# Patient Record
Sex: Female | Born: 1990
Health system: Southern US, Community
[De-identification: ages and names within clinical notes are randomized; demographics above are authoritative.]

## PROBLEM LIST (undated history)

## (undated) DIAGNOSIS — E039 Hypothyroidism, unspecified: Secondary | ICD-10-CM

## (undated) HISTORY — PX: NO PAST SURGERIES: SHX2092

## (undated) HISTORY — DX: Hypothyroidism, unspecified: E03.9

---
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
## 2009-04-30 ENCOUNTER — Ambulatory Visit (HOSPITAL_COMMUNITY): Admission: RE | Admit: 2009-04-30 | Discharge: 2009-04-30 | Payer: Self-pay | Admitting: Advanced Practice Midwife

## 2009-08-24 IMAGING — CT ABDOMEN^ABDOMEN_PELVIS_ROUTINE (ADULT)
1 of 3 series · 14 of 32 positions shown, 19 images · IV contrast (Omnipaque 300)
Comparison: 04/16/2004

CT ABDOMEN

CLINICAL DATA: Epigastric abdominal pain

CT ABDOMEN AND PELVIS WITH CONTRAST
TECHNIQUE: Multidetector CT imaging of the abdomen and pelvis was
performed using the standard protocol following bolus
administration of intravenous contrast.
Contrast: 100 ml 9mnipaque-3EE

[Series 2: abd_pel 5.0 b40f · axial · 0.69mm/px · z∈[-484,-34]mm · 14 of 103 slices shown, 19 images]
[im 7/103  soft-tissue]
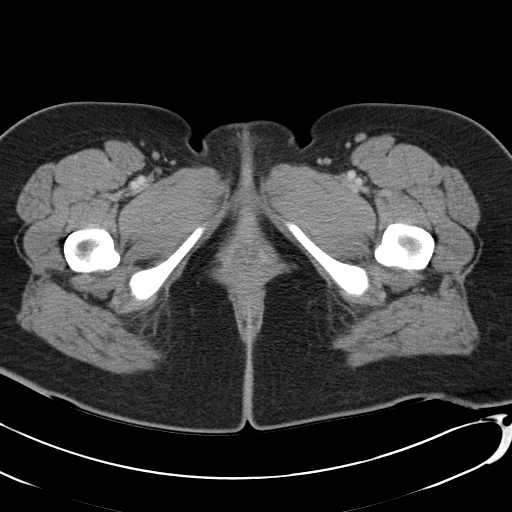
[im 7/103  bone]
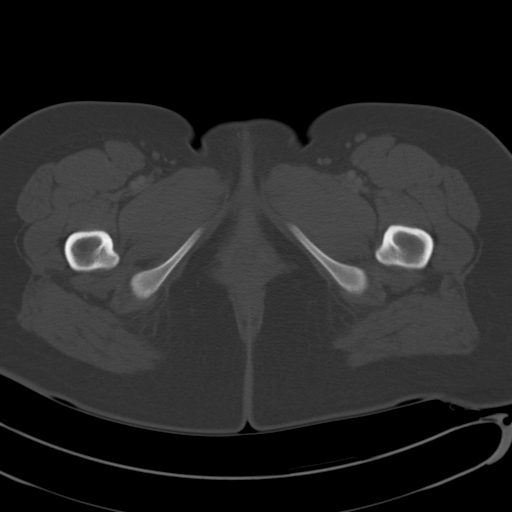
[im 13/103  soft-tissue]
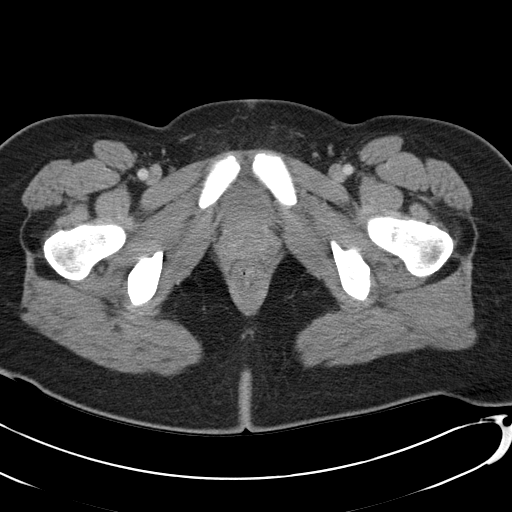
[im 25/103  soft-tissue]
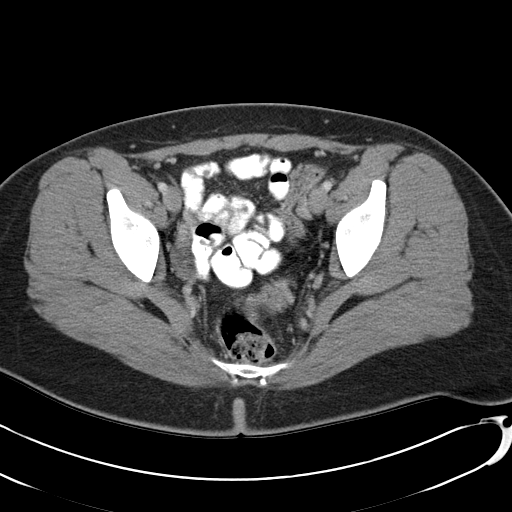
[im 31/103  soft-tissue]
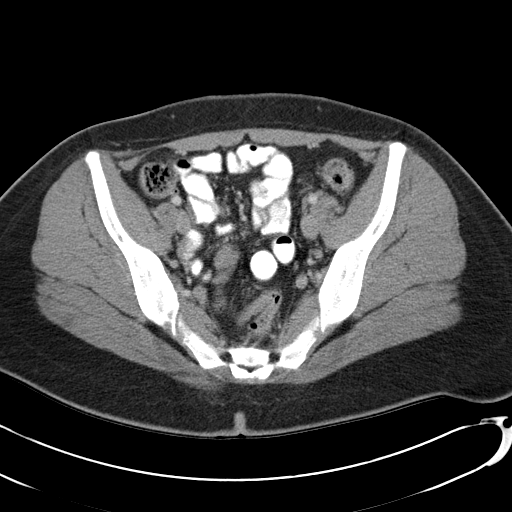
[im 37/103  soft-tissue]
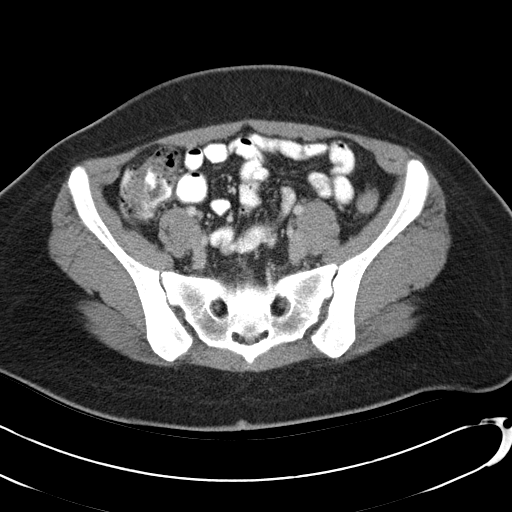
[im 43/103  soft-tissue]
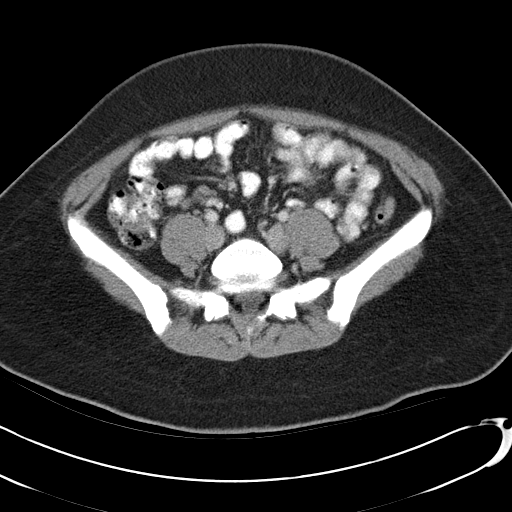
[im 55/103  soft-tissue]
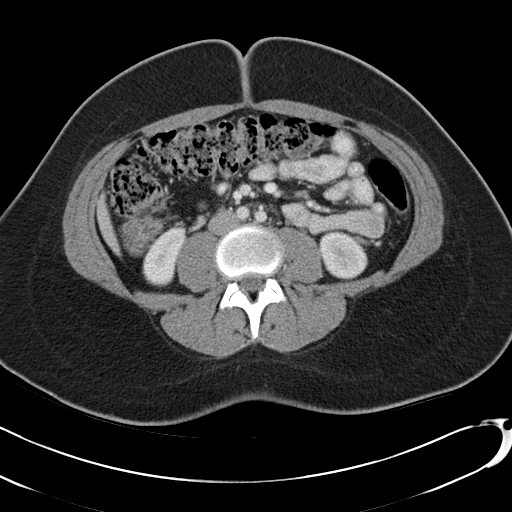
[im 61/103  soft-tissue]
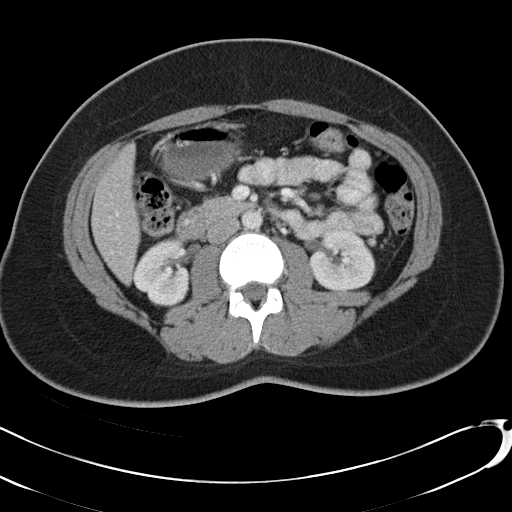
[im 67/103  soft-tissue]
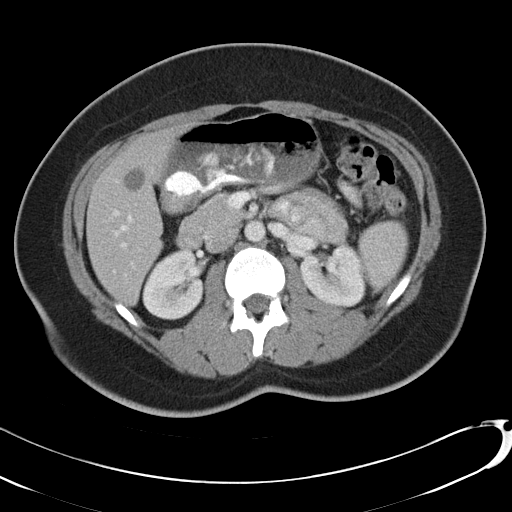
[im 67/103  bone]
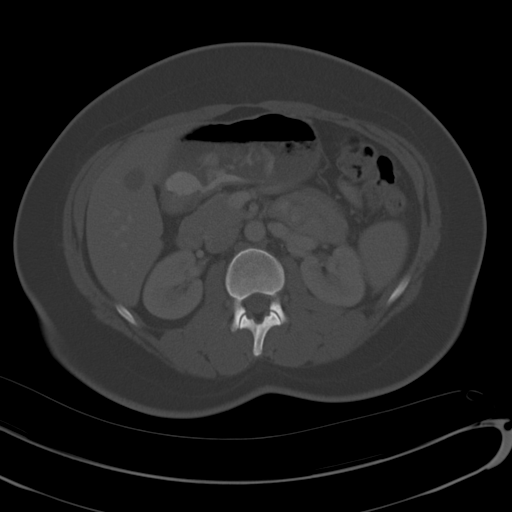
[im 73/103  soft-tissue]
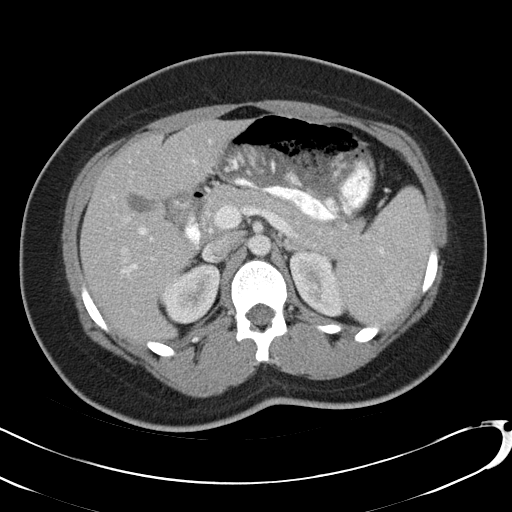
[im 79/103  soft-tissue]
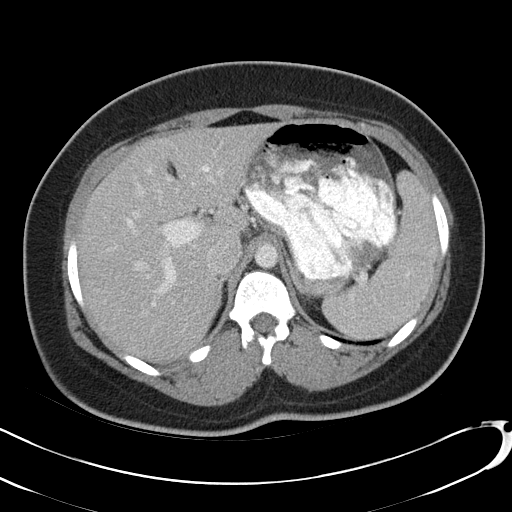
[im 79/103  lung]
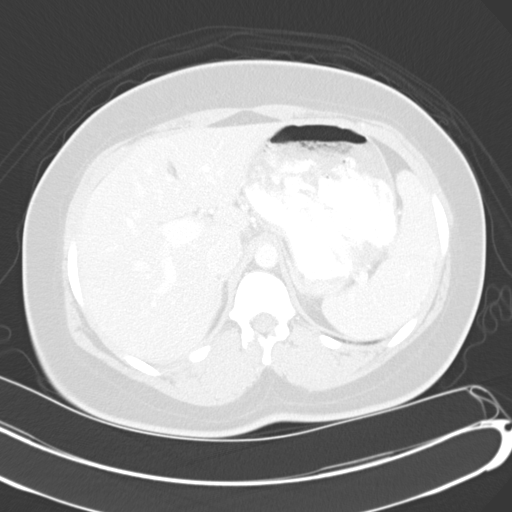
[im 85/103  lung]
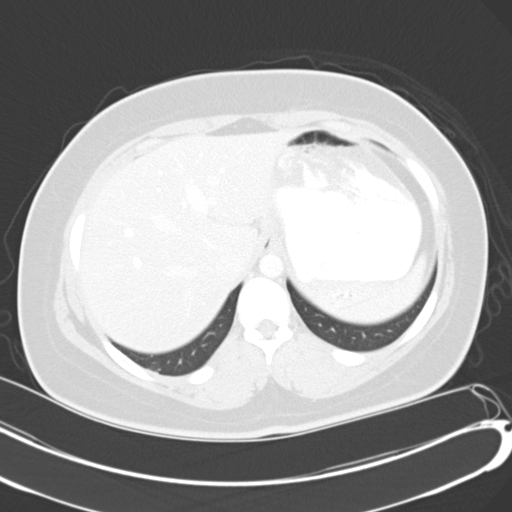
[im 91/103  soft-tissue]
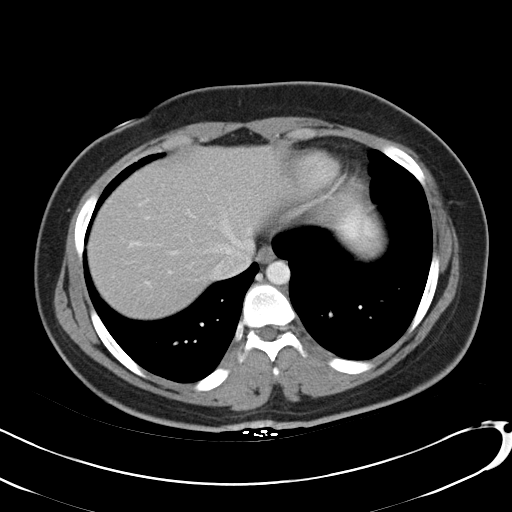
[im 91/103  lung]
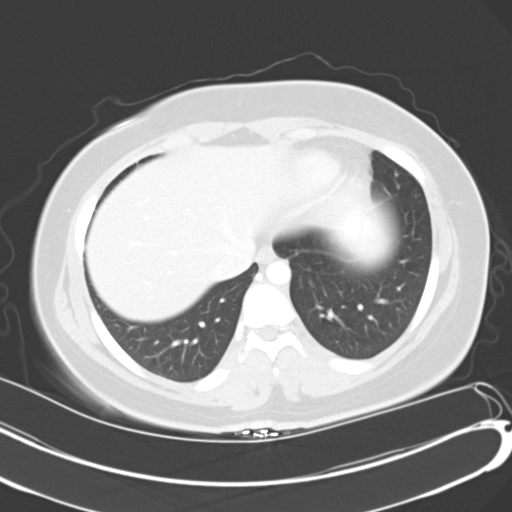
[im 97/103  soft-tissue]
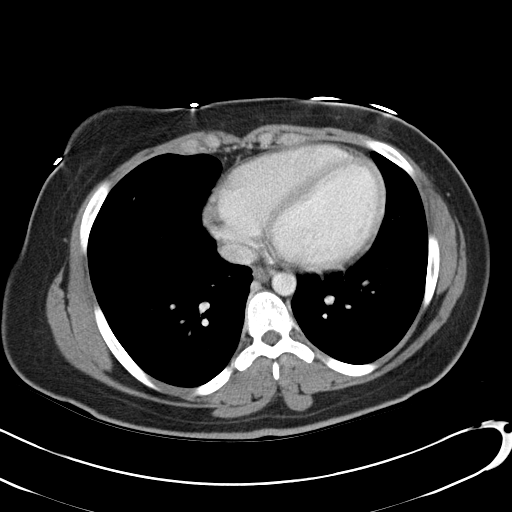
[im 97/103  lung]
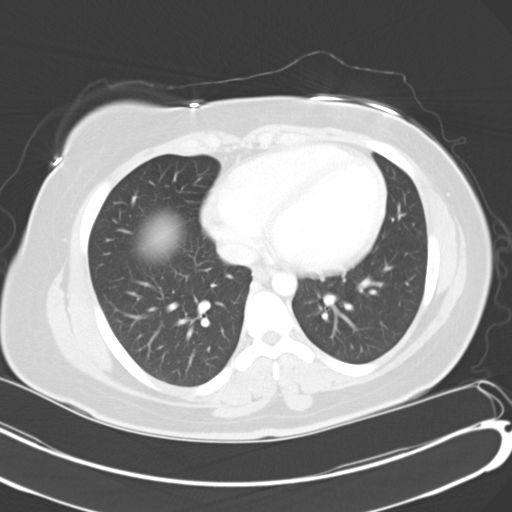

[14 of 32 positions shown; findings below may reference images not displayed]

FINDINGS: The lung bases are clear.  The liver, spleen, pancreas,
adrenal glands and kidneys are unremarkable.  The stomach,
duodenum, small bowel and colon demonstrate no significant
findings.  The aorta is normal in caliber.  The major branch
vessels are normal.

No mesenteric or retroperitoneal masses or adenopathy.  The bony
structures are unremarkable.
IMPRESSION: No acute abdominal findings, mass lesions or adenopathy.

CT PELVIS
FINDINGS: The rectum, sigmoid colon visualized small bowel loops
are unremarkable.  The uterus and ovaries appear normal.  The
appendix is visualized and is normal.  The bladder is unremarkable.
No pelvic masses, adenopathy or free pelvic fluid collections. The
bony pelvis is intact.
IMPRESSION: No acute pelvic findings, mass lesions or adenopathy.

## 2010-07-04 ENCOUNTER — Encounter: Payer: Self-pay | Admitting: Family Medicine

## 2010-08-08 ENCOUNTER — Emergency Department (HOSPITAL_COMMUNITY)
Admission: EM | Admit: 2010-08-08 | Discharge: 2010-08-08 | Disposition: A | Discharge status: Home / Self Care | Payer: 59 | Attending: Emergency Medicine | Admitting: Emergency Medicine

## 2010-08-08 DIAGNOSIS — H9209 Otalgia, unspecified ear: Secondary | ICD-10-CM | POA: Insufficient documentation

## 2010-10-26 NOTE — Op Note (Signed)
Gloria Lyons                 ACCOUNT NO.:  [id number]   MEDICAL RECORD NO.:  [id number]          PATIENT TYPE:  AMB   LOCATION:  DAY                           FACILITY:  [facility]   PHYSICIAN:  Gloria Lyons, M.D.      DATE OF BIRTH:  February 12, 1991   DATE OF PROCEDURE:  06/17/2008  DATE OF DISCHARGE:                               OPERATIVE REPORT   REFERRING Gloria Lyons:  Gloria Duel, MD   PROCEDURE:  Esophagogastroduodenoscopy with cold forceps biopsy of the  gastric and duodenal mucosa.   INDICATIONS FOR EXAM:  Ms. Gloria Lyons is an 20 year old female who complains  of postprandial epigastric pain.  She has had a CT scan and abdominal  ultrasound, which were normal.  She has a significant family history of  irritable bowel syndrome.  Her mother also had polyps.  She is currently  on Aciphex and Hyomax.  The above seemed to give her some relief, but  not complete relief especially with spicy foods.   FINDINGS:  1. Normal esophagus without evidence of Barrett mass, erosion,      ulceration, or stricture.  2. Multiple erosions seen in the antrum.  Biopsies obtained via cold      forceps to evaluate for H. pylori gastritis or eosinophilic      gastritis.  3. Normal duodenal bulb, ampulla, and second portion of the duodenum.      Biopsies obtained via cold forceps to evaluate for eosinophilic      duodenitis or celiac sprue.   DIAGNOSIS:  Mild gastritis, possibly etiology for postprandial  epigastric pain.   RECOMMENDATIONS:  1. She should continue the Aciphex indefinitely.  2. Will call her with the results of the biopsies.  3. Follow up appointment 6 weeks with Dr. Cira Lyons.  4. She is given a handout on gastritis and should follow a high-fiber      diet.  She is also given a handout on high-fiber diet.  5. No aspirins or anti-inflammatory drugs for 30 days.  No      anticoagulation for 5 days.   MEDICATIONS:  1. Demerol 50 mg IV.  2. Versed 4 mg IV.   PROCEDURE TECHNIQUE:  Physical  exam was performed.  Informed consent was  obtained from the patient after explaining the benefits, risks, and  alternatives to the procedure.  The patient was connected to the monitor  and placed in left lateral position.  Continuous oxygen was provided by  nasal cannula and IV medicine administered through an indwelling  cannula.  After administration of sedation, the patient's esophagus was  intubated and scope was advanced under direct visualization to the  second portion of the duodenum.  Scope was removed slowly by  carefully examining the color, texture, anatomy, and integrity of the  mucosa on the way out.  The patient was recovered in endoscopy and  discharged to home in satisfactory condition.   PATH:  Mild gastritis. Benign colonic mucosa.      Gloria Lyons, M.D.  Electronically Signed     SM/MEDQ  D:  06/17/2008  T:  06/17/2008  Job:  [id number]   cc:   Gloria Lyons, M.D.  Fax: [phone]

## 2010-10-26 NOTE — Consult Note (Signed)
NAMEARAYLA, KRUSCHKE                 ACCOUNT NO.:  [id number]   MEDICAL RECORD NO.:  [id number]          PATIENT TYPE:  AMB   LOCATION:  DAY                           FACILITY:  [facility]   PHYSICIAN:  Kassie Mends, M.D.      DATE OF BIRTH:  [birth year]   DATE OF CONSULTATION:  06/03/2008  DATE OF DISCHARGE:                                 CONSULTATION   REASON FOR CONSULTATION:  Abdominal pain, EGD.   PHYSICIAN REQUESTING CONSULTATION:  Patrica Duel, MD   PHYSICIAN CO-SIGNING NOTE:  Kassie Mends, MD   HISTORY OF PRESENT ILLNESS:  Valbona is very pleasant 20 year old  Caucasian female who presents today for further evaluation of ongoing  abdominal pain.  She states she has had abdominal pain off and on for  the past 4 years.  More recently, it has been very regular occurring  daily.  She notes that it seems to be worse with meals.  She  particularly has more abdominal pain with spicy foods.  She denies any  typical heartburn symptoms.  Her mother is with her today and she states  she always had digestive issues when she was a baby.  She states she was  very gassy, colicky as an infant, and she was put on Mylicon.  She  seemed to do well, up until she was about 13, however.  She states she  still is very gassy.  The patient states she has fluctuance.  Bowel  movements occur about every 2 days.  Her stools are soft and she does  not have to strain.  This is her normal.  She denies any blood in the  stool or melena.  She denies any nausea, vomiting, dysphagia, or  odynophagia.  She has completed 1 semester of classes at [employer].  Her  mother thinks her daughter is stressed out.  She is transferring to [employer]  because she did not like to have to commute to [place] everyday.  She  is currently on Aciphex 20 mg daily.  She still has abdominal pain.  She  states that she takes the HyoMax, it does seem to help.   CURRENT MEDICATIONS:  1. Aciphex 20 mg daily.  2. HyoMax p.r.n.   ALLERGIES:  No  known drug allergies.   PAST MEDICAL HISTORY:  Abdominal pain recurrent.  In 2005, she had a  workup including HIDA scan with a gallbladder ejection fraction normal  at 39.6%.  She had a CT at that time as well, which showed a large  amount of stool in the colon.  Abdominal ultrasound was normal.  In  2008, she had another abdominal ultrasound, which was normal and upper  GI series, which was normal.  She had a CT of the abdomen and pelvis on  May 13, 2008, which was normal.  She had blood work recently we are  trying to retrieve these results.   FAMILY HISTORY:  Mother has history of IBS and colonic polyps.  She is  not sure what type of polyps, she has a followup with her  gastroenterologist Dr. Virginia Rochester next  month.  Her father has had leukemia.   SOCIAL HISTORY:  She is single.  She is a Landscape architect.  She is nonsmoker.  No alcohol use.  No drug use either.   REVIEW OF SYSTEMS:  See HPI for GI.  CONSTITUTIONAL:  She complains of  weight gain.  CARDIOPULMONARY:  No chest pain, shortness of breath,  palpitations, or cough.  GENITOURINARY:  No dysuria or hematuria.  Her  menstrual cycles occur about every 5-6 week.  She denies any cramping  and they are fairly light.   PHYSICAL EXAMINATION:  VITAL SIGNS:  Weight 198.5, height 5 feet 5  inches, temperature 98.2, blood pressure 120/70, and pulse 80.  GENERAL:  Pleasant, well-nourished, well-developed Caucasian female, in  no acute distress.  SKIN:  Warm and dry.  No jaundice.  HEENT:  Sclerae nonicteric.  Oropharyngeal mucosa moist and pink.  No  lesions, erythema, or exudate.  No lymphadenopathy or thyromegaly.  CHEST:  Lungs are clear to auscultation.  CARDIAC:  Regular rate and rhythm.  Normal S1 and S2.  No murmurs, rubs,  or gallops.  ABDOMEN:  Positive bowel sounds. Abdomen is slightly obese and soft.  She has mild tenderness above the umbilicus and to deep palpation.  No  rebound or guarding.  No  organomegaly or masses.  No abdominal bruits or  hernias.  LOWER EXTREMITIES:  No edema.   LABORATORY DATA:  On April 30, 2008, CBC is normal.  MET-7, LFTs  normal.  H. pylori serologies were negative.   IMPRESSION:  Camry is 20 year old female who has recurrent abdominal  pain.  This has all been off and on now for 4 years.  She has had  extensive evaluation as above.  She has daily epigastric pain at this  point worse with spicy foods.  She is on Aciphex 20 mg daily and was  recently started.  Her differential diagnosis includes dyspepsia,  gastroesophageal reflux disease, peptic ulcer disease (less likely).  I  have not excluded biliary dyskinesia at this point.  She really does not  appreciate any constipation, but may consider empirical treatment at  some point if no positive findings.   PLAN:  1. EGD with Dr. Cira Servant in the near future.  2. She will continue Aciphex and HyoMax for now.  3. Antireflux measures and consume a bland diet for now.      Tana Coast, P.A.      Kassie Mends, M.D.  Electronically Signed    LL/MEDQ  D:  06/03/2008  T:  06/04/2008  Job:  [id number]   cc:   Patrica Duel, M.D.  Fax: [phone]

## 2010-10-26 NOTE — Assessment & Plan Note (Signed)
NAMEMarland Kitchen  Gloria Lyons, Gloria Lyons                  CHART#:  [id number]   DATE:  07/30/2008                       DOB:  04/19/1991   PROBLEM LIST:  1. Mild gastritis on upper endoscopy.  2. Mild irritable bowel syndrome, constipation predominant.   SUBJECTIVE:  The patient is an 20 year old female who presents as a  return patient visit.  She has had used no HyoMax for 3-4 weeks.  She  takes Aciphex every day, but sometimes forgets.  She rarely has  abdominal pain.  Her appetite is good.   MEDICATIONS:  1. Aciphex 20 mg daily.  2. HyoMax as needed.   OBJECTIVE:  VITAL SIGNS:  Weight 197 pounds (unchanged since December  2009), height 5 feet 5 inches, temperature 98.4, blood pressure 110/72,  pulse 80.GENERAL:  She is in no apparent distress.  Alert and oriented  x4.LUNGS:  Clear to auscultation bilaterally.CARDIOVASCULAR:  Regular  rhythm.  No murmur.  Normal S1, S2.ABDOMEN:  Bowel sounds present, soft,  nontender, nondistended.  No rebound or guarding.   ASSESSMENT:  The patient is an 20 year old female who had gastritis and  her symptoms are now controlled on Aciphex.  She may have mild irritable  bowel, constipation predominant.  She has not used the HyoMax anymore.  Thank you for allowing me to see the patient in consultation.  My  recommendations follow.   RECOMMENDATIONS:  1. She should continue the Aciphex indefinitely.  2. She may use the HyoMax as needed for abdominal pain.  She may      follow with me as needed. Discussed Hyomax may cause constipation.       Kassie Mends, M.D.  Electronically Signed    SM/MEDQ  D:  07/30/2008  T:  07/31/2008  Job:  [id number]   cc:   Patrica Duel, M.D.

## 2017-01-25 ENCOUNTER — Ambulatory Visit (INDEPENDENT_AMBULATORY_CARE_PROVIDER_SITE_OTHER): Payer: Self-pay | Admitting: Adult Health

## 2017-01-25 ENCOUNTER — Encounter: Payer: Self-pay | Admitting: Adult Health

## 2017-01-25 VITALS — BP 120/80 | HR 87 | Ht 64.75 in | Wt 215.0 lb

## 2017-01-25 DIAGNOSIS — Z3A08 8 weeks gestation of pregnancy: Secondary | ICD-10-CM

## 2017-01-25 DIAGNOSIS — Z3201 Encounter for pregnancy test, result positive: Secondary | ICD-10-CM

## 2017-01-25 DIAGNOSIS — O3680X0 Pregnancy with inconclusive fetal viability, not applicable or unspecified: Secondary | ICD-10-CM

## 2017-01-25 DIAGNOSIS — N926 Irregular menstruation, unspecified: Secondary | ICD-10-CM

## 2017-01-25 LAB — POCT URINE PREGNANCY: Preg Test, Ur: POSITIVE — AB

## 2017-01-25 MED ORDER — PRENATAL GUMMIES/DHA & FA 0.4-32.5 MG PO CHEW: 2.0000 | CHEWABLE_TABLET | Freq: Every day | ORAL | Status: DC

## 2017-01-25 NOTE — Patient Instructions (Signed)
First Trimester of Pregnancy The first trimester of pregnancy is from week 1 until the end of week 13 (months 1 through 3). A week after a sperm fertilizes an egg, the egg will implant on the wall of the uterus. This embryo will begin to develop into a baby. Genes from you and your partner will form the baby. The female genes will determine whether the baby will be a boy or a girl. At 6-8 weeks, the eyes and face will be formed, and the heartbeat can be seen on ultrasound. At the end of 12 weeks, all the baby's organs will be formed. Now that you are pregnant, you will want to do everything you can to have a healthy baby. Two of the most important things are to get good prenatal care and to follow your health care provider's instructions. Prenatal care is all the medical care you receive before the baby's birth. This care will help prevent, find, and treat any problems during the pregnancy and childbirth. Body changes during your first trimester Your body goes through many changes during pregnancy. The changes vary from woman to woman.  You may gain or lose a couple of pounds at first.  You may feel sick to your stomach (nauseous) and you may throw up (vomit). If the vomiting is uncontrollable, call your health care provider.  You may tire easily.  You may develop headaches that can be relieved by medicines. All medicines should be approved by your health care provider.  You may urinate more often. Painful urination may mean you have a bladder infection.  You may develop heartburn as a result of your pregnancy.  You may develop constipation because certain hormones are causing the muscles that push stool through your intestines to slow down.  You may develop hemorrhoids or swollen veins (varicose veins).  Your breasts may begin to grow larger and become tender. Your nipples may stick out more, and the tissue that surrounds them (areola) may become darker.  Your gums may bleed and may be  sensitive to brushing and flossing.  Dark spots or blotches (chloasma, mask of pregnancy) may develop on your face. This will likely fade after the baby is born.  Your menstrual periods will stop.  You may have a loss of appetite.  You may develop cravings for certain kinds of food.  You may have changes in your emotions from day to day, such as being excited to be pregnant or being concerned that something may go wrong with the pregnancy and baby.  You may have more vivid and strange dreams.  You may have changes in your hair. These can include thickening of your hair, rapid growth, and changes in texture. Some women also have hair loss during or after pregnancy, or hair that feels dry or thin. Your hair will most likely return to normal after your baby is born.  What to expect at prenatal visits During a routine prenatal visit:  You will be weighed to make sure you and the baby are growing normally.  Your blood pressure will be taken.  Your abdomen will be measured to track your baby's growth.  The fetal heartbeat will be listened to between weeks 10 and 14 of your pregnancy.  Test results from any previous visits will be discussed.  Your health care provider may ask you:  How you are feeling.  If you are feeling the baby move.  If you have had any abnormal symptoms, such as leaking fluid, bleeding, severe headaches,   or abdominal cramping.  If you are using any tobacco products, including cigarettes, chewing tobacco, and electronic cigarettes.  If you have any questions.  Other tests that may be performed during your first trimester include:  Blood tests to find your blood type and to check for the presence of any previous infections. The tests will also be used to check for low iron levels (anemia) and protein on red blood cells (Rh antibodies). Depending on your risk factors, or if you previously had diabetes during pregnancy, you may have tests to check for high blood  sugar that affects pregnant women (gestational diabetes).  Urine tests to check for infections, diabetes, or protein in the urine.  An ultrasound to confirm the proper growth and development of the baby.  Fetal screens for spinal cord problems (spina bifida) and Down syndrome.  HIV (human immunodeficiency virus) testing. Routine prenatal testing includes screening for HIV, unless you choose not to have this test.  You may need other tests to make sure you and the baby are doing well.  Follow these instructions at home: Medicines  Follow your health care provider's instructions regarding medicine use. Specific medicines may be either safe or unsafe to take during pregnancy.  Take a prenatal vitamin that contains at least 600 micrograms (mcg) of folic acid.  If you develop constipation, try taking a stool softener if your health care provider approves. Eating and drinking  Eat a balanced diet that includes fresh fruits and vegetables, whole grains, good sources of protein such as meat, eggs, or tofu, and low-fat dairy. Your health care provider will help you determine the amount of weight gain that is right for you.  Avoid raw meat and uncooked cheese. These carry germs that can cause birth defects in the baby.  Eating four or five small meals rather than three large meals a day may help relieve nausea and vomiting. If you start to feel nauseous, eating a few soda crackers can be helpful. Drinking liquids between meals, instead of during meals, also seems to help ease nausea and vomiting.  Limit foods that are high in fat and processed sugars, such as fried and sweet foods.  To prevent constipation: ? Eat foods that are high in fiber, such as fresh fruits and vegetables, whole grains, and beans. ? Drink enough fluid to keep your urine clear or pale yellow. Activity  Exercise only as directed by your health care provider. Most women can continue their usual exercise routine during  pregnancy. Try to exercise for 30 minutes at least 5 days a week. Exercising will help you: ? Control your weight. ? Stay in shape. ? Be prepared for labor and delivery.  Experiencing pain or cramping in the lower abdomen or lower back is a good sign that you should stop exercising. Check with your health care provider before continuing with normal exercises.  Try to avoid standing for long periods of time. Move your legs often if you must stand in one place for a long time.  Avoid heavy lifting.  Wear low-heeled shoes and practice good posture.  You may continue to have sex unless your health care provider tells you not to. Relieving pain and discomfort  Wear a good support bra to relieve breast tenderness.  Take warm sitz baths to soothe any pain or discomfort caused by hemorrhoids. Use hemorrhoid cream if your health care provider approves.  Rest with your legs elevated if you have leg cramps or low back pain.  If you develop   varicose veins in your legs, wear support hose. Elevate your feet for 15 minutes, 3-4 times a day. Limit salt in your diet. Prenatal care  Schedule your prenatal visits by the twelfth week of pregnancy. They are usually scheduled monthly at first, then more often in the last 2 months before delivery.  Write down your questions. Take them to your prenatal visits.  Keep all your prenatal visits as told by your health care provider. This is important. Safety  Wear your seat belt at all times when driving.  Make a list of emergency phone numbers, including numbers for family, friends, the hospital, and police and fire departments. General instructions  Ask your health care provider for a referral to a local prenatal education class. Begin classes no later than the beginning of month 6 of your pregnancy.  Ask for help if you have counseling or nutritional needs during pregnancy. Your health care provider can offer advice or refer you to specialists for help  with various needs.  Do not use hot tubs, steam rooms, or saunas.  Do not douche or use tampons or scented sanitary pads.  Do not cross your legs for long periods of time.  Avoid cat litter boxes and soil used by cats. These carry germs that can cause birth defects in the baby and possibly loss of the fetus by miscarriage or stillbirth.  Avoid all smoking, herbs, alcohol, and medicines not prescribed by your health care provider. Chemicals in these products affect the formation and growth of the baby.  Do not use any products that contain nicotine or tobacco, such as cigarettes and e-cigarettes. If you need help quitting, ask your health care provider. You may receive counseling support and other resources to help you quit.  Schedule a dentist appointment. At home, brush your teeth with a soft toothbrush and be gentle when you floss. Contact a health care provider if:  You have dizziness.  You have mild pelvic cramps, pelvic pressure, or nagging pain in the abdominal area.  You have persistent nausea, vomiting, or diarrhea.  You have a bad smelling vaginal discharge.  You have pain when you urinate.  You notice increased swelling in your face, hands, legs, or ankles.  You are exposed to fifth disease or chickenpox.  You are exposed to German measles (rubella) and have never had it. Get help right away if:  You have a fever.  You are leaking fluid from your vagina.  You have spotting or bleeding from your vagina.  You have severe abdominal cramping or pain.  You have rapid weight gain or loss.  You vomit blood or material that looks like coffee grounds.  You develop a severe headache.  You have shortness of breath.  You have any kind of trauma, such as from a fall or a car accident. Summary  The first trimester of pregnancy is from week 1 until the end of week 13 (months 1 through 3).  Your body goes through many changes during pregnancy. The changes vary from  woman to woman.  You will have routine prenatal visits. During those visits, your health care provider will examine you, discuss any test results you may have, and talk with you about how you are feeling. This information is not intended to replace advice given to you by your health care provider. Make sure you discuss any questions you have with your health care provider. Document Released: 05/24/2001 Document Revised: 05/11/2016 Document Reviewed: 05/11/2016 Elsevier Interactive Patient Education  2017 Elsevier   Inc.a Advised to not do cruise to [place] in 1 week

## 2017-01-25 NOTE — Progress Notes (Signed)
Subjective:     Patient ID: Gloria Lyons, female   DOB: 08/08/1990, 26 y.o.   MRN: [id number]  HPI Gloria Lyons is a 26 year old white female, married in for UPT, has missed a period and is gassy.She works at [employer], so does her husband.   Review of Systems +missed period  + gassy   Reviewed past medical,surgical, social and family history. Reviewed medications and allergies.     Objective:   Physical Exam BP 120/80 (BP Location: Left Arm, Patient Position: Sitting, Cuff Size: Large)   Pulse 87   Ht 5' 4.75" (1.645 m)   Wt 215 lb (97.5 kg)   LMP 11/28/2016 (Approximate)   BMI 36.05 kg/m UPT +, about 8+2 weeks by LMP, with EDD 09/04/17.Skin warm and dry. Neck: mid line trachea, normal thyroid, good ROM, no lymphadenopathy noted. Lungs: clear to ausculation bilaterally. Cardiovascular: regular rate and rhythm.Abdomen is soft and non tender. PHQ 2 score 0.PT [facility] and have after hours call service.Her husband says they have cruise to [place] for [place]8, instructed not to go due [facility] and its risks.     Assessment:     1. Pregnancy examination or test, positive result   2. [redacted] weeks gestation of pregnancy   3. Encounter to determine fetal viability of pregnancy, single or unspecified fetus       Plan:     Take Gummies  Return in 1 week for dating [place] Review handouts on first trimester and by [employer] [facility] Advised to not take cruise to [place], [place] virus     Note given no lifting >25 lbs

## 2017-02-01 ENCOUNTER — Other Ambulatory Visit: Payer: Self-pay | Admitting: Adult Health

## 2017-02-01 ENCOUNTER — Ambulatory Visit (INDEPENDENT_AMBULATORY_CARE_PROVIDER_SITE_OTHER): Payer: Federal, State, Local not specified - PPO

## 2017-02-01 ENCOUNTER — Telehealth: Payer: Self-pay | Admitting: Adult Health

## 2017-02-01 DIAGNOSIS — Z3A09 9 weeks gestation of pregnancy: Secondary | ICD-10-CM | POA: Diagnosis not present

## 2017-02-01 DIAGNOSIS — O3680X0 Pregnancy with inconclusive fetal viability, not applicable or unspecified: Secondary | ICD-10-CM

## 2017-02-01 MED ORDER — DOXYLAMINE-PYRIDOXINE ER 20-20 MG PO TBCR: 1.0000 | EXTENDED_RELEASE_TABLET | Freq: Two times a day (BID) | ORAL | 0 refills | Status: DC

## 2017-02-01 NOTE — Progress Notes (Signed)
[place] TA/TV 9+2 wks single IUP w/ys,positive FHT 174 bpm,normal ovaries bilat,crl 27.08 mm,EDD 09/04/2017  By LMP

## 2017-02-01 NOTE — Telephone Encounter (Signed)
Pt had [place] today, and is having more nausea, gave her 36 bonjesta to try, 1 bid, also told her again not to go on cruise to [place] but it is her choice.She said that there is no Zeka in that area now. I still would not go and risk it.

## 2017-02-21 ENCOUNTER — Ambulatory Visit: Payer: Federal, State, Local not specified - PPO | Admitting: *Deleted

## 2017-02-21 ENCOUNTER — Ambulatory Visit (INDEPENDENT_AMBULATORY_CARE_PROVIDER_SITE_OTHER): Payer: Federal, State, Local not specified - PPO | Admitting: Advanced Practice Midwife

## 2017-02-21 ENCOUNTER — Other Ambulatory Visit (HOSPITAL_COMMUNITY)
Admission: RE | Admit: 2017-02-21 | Discharge: 2017-02-21 | Disposition: A | Discharge status: Home / Self Care | Payer: Federal, State, Local not specified - PPO | Source: Ambulatory Visit | Attending: Advanced Practice Midwife | Admitting: Advanced Practice Midwife

## 2017-02-21 ENCOUNTER — Encounter: Payer: Self-pay | Admitting: Advanced Practice Midwife

## 2017-02-21 VITALS — BP 112/74 | HR 87 | Wt 219.0 lb

## 2017-02-21 DIAGNOSIS — Z3401 Encounter for supervision of normal first pregnancy, first trimester: Secondary | ICD-10-CM | POA: Insufficient documentation

## 2017-02-21 DIAGNOSIS — Z124 Encounter for screening for malignant neoplasm of cervix: Secondary | ICD-10-CM

## 2017-02-21 DIAGNOSIS — Z3400 Encounter for supervision of normal first pregnancy, unspecified trimester: Secondary | ICD-10-CM | POA: Insufficient documentation

## 2017-02-21 DIAGNOSIS — E039 Hypothyroidism, unspecified: Secondary | ICD-10-CM | POA: Diagnosis not present

## 2017-02-21 DIAGNOSIS — Z3A12 12 weeks gestation of pregnancy: Secondary | ICD-10-CM

## 2017-02-21 DIAGNOSIS — O99281 Endocrine, nutritional and metabolic diseases complicating pregnancy, first trimester: Secondary | ICD-10-CM

## 2017-02-21 DIAGNOSIS — Z1389 Encounter for screening for other disorder: Secondary | ICD-10-CM

## 2017-02-21 DIAGNOSIS — Z331 Pregnant state, incidental: Secondary | ICD-10-CM

## 2017-02-21 LAB — POCT URINALYSIS DIPSTICK
KETONES UA: NEGATIVE
Leukocytes, UA: NEGATIVE
Nitrite, UA: NEGATIVE
PROTEIN UA: NEGATIVE
RBC UA: NEGATIVE

## 2017-02-21 NOTE — Progress Notes (Signed)
  Subjective:    Dale Durhamshley W Lobban is a G1P0 3953w1d being seen today for her first obstetrical visit.  Her obstetrical history is significant for first pregnancy.  Pregnancy history fully reviewed.  Patient reports no complaints.  Feels well. .  Vitals:   02/21/17 1422  BP: 112/74  Pulse: 87  Weight: 219 lb (99.3 kg)    HISTORY: OB History  Gravida Para Term Preterm AB Living  1            SAB TAB Ectopic Multiple Live Births               # Outcome Date GA Lbr Len/2nd Weight Sex Delivery Anes PTL Lv  1 Current              Past Medical History:  Diagnosis Date  . Hypothyroidism    History reviewed. No pertinent surgical history. Family History  Problem Relation Age of Onset  . Cancer Paternal Grandfather   . Diabetes Paternal Grandfather   . Cancer Maternal Grandmother   . Diabetes Maternal Grandmother   . Diabetes Maternal Grandfather   . Leukemia Father   . Hypertension Father   . Heart attack Mother   . Seizures Mother   . Hypertension Mother      Exam       Pelvic Exam:    Perineum: Normal Perineum   Vulva: normal   Vagina:  normal mucosa, normal discharge, no palpable nodules   Uterus Normal, Gravid, FH: 12     Cervix: normal   Adnexa: Not palpable   Urinary:  urethral meatus normal    System:     Skin: normal coloration and turgor, no rashes    Neurologic: oriented, normal, normal mood   Extremities: normal strength, tone, and muscle mass   HEENT PERRLA   Mouth/Teeth mucous membranes moist, normal dentition   Neck supple and no masses   Cardiovascular: regular rate and rhythm   Respiratory:  appears well, vitals normal, no respiratory distress, acyanotic   Abdomen: soft, non-tender;  FHR: 160 US          Assessment:    Pregnancy: G1P0 Patient Active Problem List   Diagnosis Date Noted  . Supervision of normal first pregnancy 02/21/2017  . Hypothyroidism         Plan:     Initial labs drawn.TSH added Continue prenatal vitamins   Problem list reviewed and updated  Reviewed n/v relief measures and warning s/s to report  Reviewed recommended weight gain based on pre-gravid BMI  Encouraged well-balanced diet Genetic Screening discussed Integrated Screen: declined.  Ultrasound discussed; fetal survey: requested.  Return in about 4 weeks (around 03/21/2017) for LROB.  CRESENZO-DISHMAN,Dawnn Nam 02/21/2017

## 2017-02-21 NOTE — Patient Instructions (Signed)
 First Trimester of Pregnancy The first trimester of pregnancy is from week 1 until the end of week 12 (months 1 through 3). A week after a sperm fertilizes an egg, the egg will implant on the wall of the uterus. This embryo will begin to develop into a baby. Genes from you and your partner are forming the baby. The female genes determine whether the baby is a boy or a girl. At 6-8 weeks, the eyes and face are formed, and the heartbeat can be seen on ultrasound. At the end of 12 weeks, all the baby's organs are formed.  Now that you are pregnant, you will want to do everything you can to have a healthy baby. Two of the most important things are to get good prenatal care and to follow your health care provider's instructions. Prenatal care is all the medical care you receive before the baby's birth. This care will help prevent, find, and treat any problems during the pregnancy and childbirth. BODY CHANGES Your body goes through many changes during pregnancy. The changes vary from woman to woman.   You may gain or lose a couple of pounds at first.  You may feel sick to your stomach (nauseous) and throw up (vomit). If the vomiting is uncontrollable, call your health care provider.  You may tire easily.  You may develop headaches that can be relieved by medicines approved by your health care provider.  You may urinate more often. Painful urination may mean you have a bladder infection.  You may develop heartburn as a result of your pregnancy.  You may develop constipation because certain hormones are causing the muscles that push waste through your intestines to slow down.  You may develop hemorrhoids or swollen, bulging veins (varicose veins).  Your breasts may begin to grow larger and become tender. Your nipples may stick out more, and the tissue that surrounds them (areola) may become darker.  Your gums may bleed and may be sensitive to brushing and flossing.  Dark spots or blotches  (chloasma, mask of pregnancy) may develop on your face. This will likely fade after the baby is born.  Your menstrual periods will stop.  You may have a loss of appetite.  You may develop cravings for certain kinds of food.  You may have changes in your emotions from day to day, such as being excited to be pregnant or being concerned that something may go wrong with the pregnancy and baby.  You may have more vivid and strange dreams.  You may have changes in your hair. These can include thickening of your hair, rapid growth, and changes in texture. Some women also have hair loss during or after pregnancy, or hair that feels dry or thin. Your hair will most likely return to normal after your baby is born. WHAT TO EXPECT AT YOUR PRENATAL VISITS During a routine prenatal visit:  You will be weighed to make sure you and the baby are growing normally.  Your blood pressure will be taken.  Your abdomen will be measured to track your baby's growth.  The fetal heartbeat will be listened to starting around week 10 or 12 of your pregnancy.  Test results from any previous visits will be discussed. Your health care provider may ask you:  How you are feeling.  If you are feeling the baby move.  If you have had any abnormal symptoms, such as leaking fluid, bleeding, severe headaches, or abdominal cramping.  If you have any questions. Other   tests that may be performed during your first trimester include:  Blood tests to find your blood type and to check for the presence of any previous infections. They will also be used to check for low iron levels (anemia) and Rh antibodies. Later in the pregnancy, blood tests for diabetes will be done along with other tests if problems develop.  Urine tests to check for infections, diabetes, or protein in the urine.  An ultrasound to confirm the proper growth and development of the baby.  An amniocentesis to check for possible genetic problems.  Fetal  screens for spina bifida and Down syndrome.  You may need other tests to make sure you and the baby are doing well. HOME CARE INSTRUCTIONS  Medicines  Follow your health care provider's instructions regarding medicine use. Specific medicines may be either safe or unsafe to take during pregnancy.  Take your prenatal vitamins as directed.  If you develop constipation, try taking a stool softener if your health care provider approves. Diet  Eat regular, well-balanced meals. Choose a variety of foods, such as meat or vegetable-based protein, fish, milk and low-fat dairy products, vegetables, fruits, and whole grain breads and cereals. Your health care provider will help you determine the amount of weight gain that is right for you.  Avoid raw meat and uncooked cheese. These carry germs that can cause birth defects in the baby.  Eating four or five small meals rather than three large meals a day may help relieve nausea and vomiting. If you start to feel nauseous, eating a few soda crackers can be helpful. Drinking liquids between meals instead of during meals also seems to help nausea and vomiting.  If you develop constipation, eat more high-fiber foods, such as fresh vegetables or fruit and whole grains. Drink enough fluids to keep your urine clear or pale yellow. Activity and Exercise  Exercise only as directed by your health care provider. Exercising will help you:  Control your weight.  Stay in shape.  Be prepared for labor and delivery.  Experiencing pain or cramping in the lower abdomen or low back is a good sign that you should stop exercising. Check with your health care provider before continuing normal exercises.  Try to avoid standing for long periods of time. Move your legs often if you must stand in one place for a long time.  Avoid heavy lifting.  Wear low-heeled shoes, and practice good posture.  You may continue to have sex unless your health care provider directs you  otherwise. Relief of Pain or Discomfort  Wear a good support bra for breast tenderness.   Take warm sitz baths to soothe any pain or discomfort caused by hemorrhoids. Use hemorrhoid cream if your health care provider approves.   Rest with your legs elevated if you have leg cramps or low back pain.  If you develop varicose veins in your legs, wear support hose. Elevate your feet for 15 minutes, 3-4 times a day. Limit salt in your diet. Prenatal Care  Schedule your prenatal visits by the twelfth week of pregnancy. They are usually scheduled monthly at first, then more often in the last 2 months before delivery.  Write down your questions. Take them to your prenatal visits.  Keep all your prenatal visits as directed by your health care provider. Safety  Wear your seat belt at all times when driving.  Make a list of emergency phone numbers, including numbers for family, friends, the hospital, and police and fire departments. General   Tips  Ask your health care provider for a referral to a local prenatal education class. Begin classes no later than at the beginning of month 6 of your pregnancy.  Ask for help if you have counseling or nutritional needs during pregnancy. Your health care provider can offer advice or refer you to specialists for help with various needs.  Do not use hot tubs, steam rooms, or saunas.  Do not douche or use tampons or scented sanitary pads.  Do not cross your legs for long periods of time.  Avoid cat litter boxes and soil used by cats. These carry germs that can cause birth defects in the baby and possibly loss of the fetus by miscarriage or stillbirth.  Avoid all smoking, herbs, alcohol, and medicines not prescribed by your health care provider. Chemicals in these affect the formation and growth of the baby.  Schedule a dentist appointment. At home, brush your teeth with a soft toothbrush and be gentle when you floss. SEEK MEDICAL CARE IF:   You have  dizziness.  You have mild pelvic cramps, pelvic pressure, or nagging pain in the abdominal area.  You have persistent nausea, vomiting, or diarrhea.  You have a bad smelling vaginal discharge.  You have pain with urination.  You notice increased swelling in your face, hands, legs, or ankles. SEEK IMMEDIATE MEDICAL CARE IF:   You have a fever.  You are leaking fluid from your vagina.  You have spotting or bleeding from your vagina.  You have severe abdominal cramping or pain.  You have rapid weight gain or loss.  You vomit blood or material that looks like coffee grounds.  You are exposed to German measles and have never had them.  You are exposed to fifth disease or chickenpox.  You develop a severe headache.  You have shortness of breath.  You have any kind of trauma, such as from a fall or a car accident. Document Released: 05/24/2001 Document Revised: 10/14/2013 Document Reviewed: 04/09/2013 [employer] Patient Information 2015 [employer]. This information is not intended to replace advice given to you by your health care provider. Make sure you discuss any questions you have with your health care provider.   Nausea & Vomiting  Have saltine crackers or pretzels by your bed and eat a few bites before you raise your head out of bed in the morning  Eat small frequent meals throughout the day instead of large meals  Drink plenty of fluids throughout the day to stay hydrated, just don't drink a lot of fluids with your meals.  This can make your stomach fill up faster making you feel sick  Do not brush your teeth right after you eat  Products with real ginger are good for nausea, like ginger ale and ginger hard candy Make sure it says made with real ginger!  Sucking on sour candy like lemon heads is also good for nausea  If your prenatal vitamins make you nauseated, take them at night so you will sleep through the nausea  Sea Bands  If you feel like you need  medicine for the nausea & vomiting please let us know  If you are unable to keep any fluids or food down please let us know   Constipation  Drink plenty of fluid, preferably water, throughout the day  Eat foods high in fiber such as fruits, vegetables, and grains  Exercise, such as walking, is a good way to keep your bowels regular  Drink warm fluids, especially warm   prune juice, or decaf coffee  Eat a 1/2 cup of real oatmeal (not instant), 1/2 cup applesauce, and 1/2-1 cup warm prune juice every day  If needed, you may take Colace (docusate sodium) stool softener once or twice a day to help keep the stool soft. If you are pregnant, wait until you are out of your first trimester (12-14 weeks of pregnancy)  If you still are having problems with constipation, you may take Miralax once daily as needed to help keep your bowels regular.  If you are pregnant, wait until you are out of your first trimester (12-14 weeks of pregnancy)  Safe Medications in Pregnancy   Acne: Benzoyl Peroxide Salicylic Acid  Backache/Headache: Tylenol: 2 regular strength every 4 hours OR              2 Extra strength every 6 hours  Colds/Coughs/Allergies: Benadryl (alcohol free) 25 mg every 6 hours as needed Breath right strips Claritin Cepacol throat lozenges Chloraseptic throat spray Cold-Eeze- up to three times per day Cough drops, alcohol free Flonase (by prescription only) Guaifenesin Mucinex Robitussin DM (plain only, alcohol free) Saline nasal spray/drops Sudafed (pseudoephedrine) & Actifed ** use only after [redacted] weeks gestation and if you do not have high blood pressure Tylenol Vicks Vaporub Zinc lozenges Zyrtec   Constipation: Colace Ducolax suppositories Fleet enema Glycerin suppositories Metamucil Milk of magnesia Miralax Senokot Smooth move tea  Diarrhea: Kaopectate Imodium A-D  *NO pepto Bismol  Hemorrhoids: Anusol Anusol HC Preparation  H Tucks  Indigestion: Tums Maalox Mylanta Zantac  Pepcid  Insomnia: Benadryl (alcohol free) 25mg every 6 hours as needed Tylenol PM Unisom, no Gelcaps  Leg Cramps: Tums MagGel  Nausea/Vomiting:  Bonine Dramamine Emetrol Ginger extract Sea bands Meclizine  Nausea medication to take during pregnancy:  Unisom (doxylamine succinate 25 mg tablets) Take one tablet daily at bedtime. If symptoms are not adequately controlled, the dose can be increased to a maximum recommended dose of two tablets daily (1/2 tablet in the morning, 1/2 tablet mid-afternoon and one at bedtime). Vitamin B6 100mg tablets. Take one tablet twice a day (up to 200 mg per day).  Skin Rashes: Aveeno products Benadryl cream or 25mg every 6 hours as needed Calamine Lotion 1% cortisone cream  Yeast infection: Gyne-lotrimin 7 Monistat 7   **If taking multiple medications, please check labels to avoid duplicating the same active ingredients **take medication as directed on the label ** Do not exceed 4000 mg of tylenol in 24 hours **Do not take medications that contain aspirin or ibuprofen      

## 2017-02-22 LAB — URINALYSIS, ROUTINE W REFLEX MICROSCOPIC
Bilirubin, UA: NEGATIVE
Ketones, UA: NEGATIVE
NITRITE UA: NEGATIVE
PH UA: 6 (ref 5.0–7.5)
Protein, UA: NEGATIVE
RBC, UA: NEGATIVE
Specific Gravity, UA: 1.013 (ref 1.005–1.030)
UUROB: 0.2 mg/dL (ref 0.2–1.0)

## 2017-02-22 LAB — HIV ANTIBODY (ROUTINE TESTING W REFLEX): HIV Screen 4th Generation wRfx: NONREACTIVE

## 2017-02-22 LAB — ABO/RH: Rh Factor: POSITIVE

## 2017-02-22 LAB — MICROSCOPIC EXAMINATION: Casts: NONE SEEN /lpf

## 2017-02-22 LAB — CBC
HEMATOCRIT: 35.5 % (ref 34.0–46.6)
Hemoglobin: 11.7 g/dL (ref 11.1–15.9)
MCH: 28.5 pg (ref 26.6–33.0)
MCHC: 33 g/dL (ref 31.5–35.7)
MCV: 87 fL (ref 79–97)
PLATELETS: 127 10*3/uL — AB (ref 150–379)
RBC: 4.1 x10E6/uL (ref 3.77–5.28)
RDW: 13.4 % (ref 12.3–15.4)
WBC: 10.8 10*3/uL (ref 3.4–10.8)

## 2017-02-22 LAB — VARICELLA ZOSTER ANTIBODY, IGG: Varicella zoster IgG: 829 index (ref >165)

## 2017-02-22 LAB — ANTIBODY SCREEN: Antibody Screen: NEGATIVE

## 2017-02-22 LAB — PMP SCREEN PROFILE (10S), URINE
AMPHETAMINE SCREEN URINE: NEGATIVE ng/mL
BARBITURATE SCREEN URINE: NEGATIVE ng/mL
BENZODIAZEPINE SCREEN, URINE: NEGATIVE ng/mL
CANNABINOIDS UR QL SCN: NEGATIVE ng/mL
COCAINE(METAB.)SCREEN, URINE: NEGATIVE ng/mL
CREATININE(CRT), U: 73.6 mg/dL (ref 20.0–300.0)
Methadone Screen, Urine: NEGATIVE ng/mL
OXYCODONE+OXYMORPHONE UR QL SCN: NEGATIVE ng/mL
Opiate Scrn, Ur: NEGATIVE ng/mL
PHENCYCLIDINE QUANTITATIVE URINE: NEGATIVE ng/mL
Ph of Urine: 5.7 (ref 4.5–8.9)
Propoxyphene Scrn, Ur: NEGATIVE ng/mL

## 2017-02-22 LAB — HEPATITIS B SURFACE ANTIGEN: Hepatitis B Surface Ag: NEGATIVE

## 2017-02-22 LAB — RUBELLA SCREEN: RUBELLA: 4.99 {index} (ref >0.99)

## 2017-02-22 LAB — TSH: TSH: 1.27 u[IU]/mL (ref 0.450–4.500)

## 2017-02-22 LAB — RPR: RPR: NONREACTIVE

## 2017-02-23 LAB — CYTOLOGY - PAP
Chlamydia: NEGATIVE
DIAGNOSIS: NEGATIVE
Neisseria Gonorrhea: NEGATIVE

## 2017-02-23 LAB — URINE CULTURE

## 2017-03-21 ENCOUNTER — Encounter: Payer: Self-pay | Admitting: Advanced Practice Midwife

## 2017-03-21 ENCOUNTER — Ambulatory Visit (INDEPENDENT_AMBULATORY_CARE_PROVIDER_SITE_OTHER): Payer: Federal, State, Local not specified - PPO | Admitting: Advanced Practice Midwife

## 2017-03-21 VITALS — BP 112/60 | HR 88 | Wt 215.0 lb

## 2017-03-21 DIAGNOSIS — Z331 Pregnant state, incidental: Secondary | ICD-10-CM

## 2017-03-21 DIAGNOSIS — Z3402 Encounter for supervision of normal first pregnancy, second trimester: Secondary | ICD-10-CM

## 2017-03-21 DIAGNOSIS — Z363 Encounter for antenatal screening for malformations: Secondary | ICD-10-CM

## 2017-03-21 DIAGNOSIS — Z3A16 16 weeks gestation of pregnancy: Secondary | ICD-10-CM

## 2017-03-21 DIAGNOSIS — Z1389 Encounter for screening for other disorder: Secondary | ICD-10-CM

## 2017-03-21 LAB — POCT URINALYSIS DIPSTICK
GLUCOSE UA: NEGATIVE
Leukocytes, UA: NEGATIVE
NITRITE UA: NEGATIVE
RBC UA: NEGATIVE

## 2017-03-21 MED ORDER — ONDANSETRON 4 MG PO TBDP: 4.0000 mg | ORAL_TABLET | Freq: Four times a day (QID) | ORAL | 2 refills | Status: DC | PRN

## 2017-03-21 NOTE — Patient Instructions (Addendum)
Second Trimester of Pregnancy The second trimester is from week 14 through week 27 (months 4 through 6). The second trimester is often a time when you feel your best. Your body has adjusted to being pregnant, and you begin to feel better physically. Usually, morning sickness has lessened or quit completely, you may have more energy, and you may have an increase in appetite. The second trimester is also a time when the fetus is growing rapidly. At the end of the sixth month, the fetus is about 9 inches long and weighs about 1 pounds. You will likely begin to feel the baby move (quickening) between 16 and 20 weeks of pregnancy. Body changes during your second trimester Your body continues to go through many changes during your second trimester. The changes vary from woman to woman.  Your weight will continue to increase. You will notice your lower abdomen bulging out.  You may begin to get stretch marks on your hips, abdomen, and breasts.  You may develop headaches that can be relieved by medicines. The medicines should be approved by your health care provider.  You may urinate more often because the fetus is pressing on your bladder.  You may develop or continue to have heartburn as a result of your pregnancy.  You may develop constipation because certain hormones are causing the muscles that push waste through your intestines to slow down.  You may develop hemorrhoids or swollen, bulging veins (varicose veins).  You may have back pain. This is caused by: ? Weight gain. ? Pregnancy hormones that are relaxing the joints in your pelvis. ? A shift in weight and the muscles that support your balance.  Your breasts will continue to grow and they will continue to become tender.  Your gums may bleed and may be sensitive to brushing and flossing.  Dark spots or blotches (chloasma, mask of pregnancy) may develop on your face. This will likely fade after the baby is born.  A dark line from your  belly button to the pubic area (linea nigra) may appear. This will likely fade after the baby is born.  You may have changes in your hair. These can include thickening of your hair, rapid growth, and changes in texture. Some women also have hair loss during or after pregnancy, or hair that feels dry or thin. Your hair will most likely return to normal after your baby is born.  What to expect at prenatal visits During a routine prenatal visit:  You will be weighed to make sure you and the fetus are growing normally.  Your blood pressure will be taken.  Your abdomen will be measured to track your baby's growth.  The fetal heartbeat will be listened to.  Any test results from the previous visit will be discussed.  Your health care provider may ask you:  How you are feeling.  If you are feeling the baby move.  If you have had any abnormal symptoms, such as leaking fluid, bleeding, severe headaches, or abdominal cramping.  If you are using any tobacco products, including cigarettes, chewing tobacco, and electronic cigarettes.  If you have any questions.  Other tests that may be performed during your second trimester include:  Blood tests that check for: ? Low iron levels (anemia). ? High blood sugar that affects pregnant women (gestational diabetes) between 24 and 28 weeks. ? Rh antibodies. This is to check for a protein on red blood cells (Rh factor).  Urine tests to check for infections, diabetes, or   protein in the urine.  An ultrasound to confirm the proper growth and development of the baby.  An amniocentesis to check for possible genetic problems.  Fetal screens for spina bifida and Down syndrome.  HIV (human immunodeficiency virus) testing. Routine prenatal testing includes screening for HIV, unless you choose not to have this test.  Follow these instructions at home: Medicines  Follow your health care provider's instructions regarding medicine use. Specific  medicines may be either safe or unsafe to take during pregnancy.  Take a prenatal vitamin that contains at least 600 micrograms (mcg) of folic acid.  If you develop constipation, try taking a stool softener if your health care provider approves. Eating and drinking  Eat a balanced diet that includes fresh fruits and vegetables, whole grains, good sources of protein such as meat, eggs, or tofu, and low-fat dairy. Your health care provider will help you determine the amount of weight gain that is right for you.  Avoid raw meat and uncooked cheese. These carry germs that can cause birth defects in the baby.  If you have low calcium intake from food, talk to your health care provider about whether you should take a daily calcium supplement.  Limit foods that are high in fat and processed sugars, such as fried and sweet foods.  To prevent constipation: ? Drink enough fluid to keep your urine clear or pale yellow. ? Eat foods that are high in fiber, such as fresh fruits and vegetables, whole grains, and beans. Activity  Exercise only as directed by your health care provider. Most women can continue their usual exercise routine during pregnancy. Try to exercise for 30 minutes at least 5 days a week. Stop exercising if you experience uterine contractions.  Avoid heavy lifting, wear low heel shoes, and practice good posture.  A sexual relationship may be continued unless your health care provider directs you otherwise. Relieving pain and discomfort  Wear a good support bra to prevent discomfort from breast tenderness.  Take warm sitz baths to soothe any pain or discomfort caused by hemorrhoids. Use hemorrhoid cream if your health care provider approves.  Rest with your legs elevated if you have leg cramps or low back pain.  If you develop varicose veins, wear support hose. Elevate your feet for 15 minutes, 3-4 times a day. Limit salt in your diet. Prenatal Care  Write down your questions.  Take them to your prenatal visits.  Keep all your prenatal visits as told by your health care provider. This is important. Safety  Wear your seat belt at all times when driving.  Make a list of emergency phone numbers, including numbers for family, friends, the hospital, and police and fire departments. General instructions  Ask your health care provider for a referral to a local prenatal education class. Begin classes no later than the beginning of month 6 of your pregnancy.  Ask for help if you have counseling or nutritional needs during pregnancy. Your health care provider can offer advice or refer you to specialists for help with various needs.  Do not use hot tubs, steam rooms, or saunas.  Do not douche or use tampons or scented sanitary pads.  Do not cross your legs for long periods of time.  Avoid cat litter boxes and soil used by cats. These carry germs that can cause birth defects in the baby and possibly loss of the fetus by miscarriage or stillbirth.  Avoid all smoking, herbs, alcohol, and unprescribed drugs. Chemicals in these products can   affect the formation and growth of the baby.  Do not use any products that contain nicotine or tobacco, such as cigarettes and e-cigarettes. If you need help quitting, ask your health care provider.  Visit your dentist if you have not gone yet during your pregnancy. Use a soft toothbrush to brush your teeth and be gentle when you floss. Contact a health care provider if:  You have dizziness.  You have mild pelvic cramps, pelvic pressure, or nagging pain in the abdominal area.  You have persistent nausea, vomiting, or diarrhea.  You have a bad smelling vaginal discharge.  You have pain when you urinate. Get help right away if:  You have a fever.  You are leaking fluid from your vagina.  You have spotting or bleeding from your vagina.  You have severe abdominal cramping or pain.  You have rapid weight gain or weight  loss.  You have shortness of breath with chest pain.  You notice sudden or extreme swelling of your face, hands, ankles, feet, or legs.  You have not felt your baby move in over an hour.  You have severe headaches that do not go away when you take medicine.  You have vision changes. Summary  The second trimester is from week 14 through week 27 (months 4 through 6). It is also a time when the fetus is growing rapidly.  Your body goes through many changes during pregnancy. The changes vary from woman to woman.  Avoid all smoking, herbs, alcohol, and unprescribed drugs. These chemicals affect the formation and growth your baby.  Do not use any tobacco products, such as cigarettes, chewing tobacco, and e-cigarettes. If you need help quitting, ask your health care provider.  Contact your health care provider if you have any questions. Keep all prenatal visits as told by your health care provider. This is important. This information is not intended to replace advice given to you by your health care provider. Make sure you discuss any questions you have with your health care provider.      CHILDBIRTH CLASSES [phone] is the phone number for Pregnancy Classes or hospital tours at [facility].   You will be referred to  [email or web address] for more information on childbirth classes  At this site you may register for classes. You may sign up for a waiting list if classes are full. Please SIGN UP FOR THIS!.   When the waiting list becomes long, sometimes new classes can be added.  Sciatica Rehab Ask your health care provider which exercises are safe for you. Do exercises exactly as told by your health care provider and adjust them as directed. It is normal to feel mild stretching, pulling, tightness, or discomfort as you do these exercises, but you should stop right away if you feel sudden pain  or your pain gets worse.Do not begin these exercises until told by your health care provider. Stretching and range of motion exercises These exercises warm up your muscles and joints and improve the movement and flexibility of your hips and your back. These exercises also help to relieve pain, numbness, and tingling. Exercise A: Sciatic nerve glide  1. Sit in a chair with your head facing down toward your chest. Place your hands behind your back. Let your shoulders slump forward. 2. Slowly straighten one of your knees while you tilt your head back as if you are looking toward the ceiling. Only straighten your leg as far as you can without making your symptoms worse. 3.  Hold for __________ seconds. 4. Slowly return to the starting position. 5. Repeat with your other leg. Repeat __________ times. Complete this exercise __________ times a day. Exercise B: Knee to chest with hip adduction and internal rotation    1. Lie on your back on a firm surface with both legs straight. 2. Bend one of your knees and move it up toward your chest until you feel a gentle stretch in your lower back and buttock. Then, move your knee toward the shoulder that is on the opposite side from your leg. ? Hold your leg in this position by holding onto the front of your knee. 3. Hold for __________ seconds. 4. Slowly return to the starting position. 5. Repeat with your other leg. Repeat __________ times. Complete this exercise __________ times a day. Exercise C: Prone extension on elbows    1. Lie on your abdomen on a firm surface. A bed may be too soft for this exercise. 2. Prop yourself up on your elbows. 3. Use your arms to help lift your chest up until you feel a gentle stretch in your abdomen and your lower back. ? This will place some of your body weight on your elbows. If this is uncomfortable, try stacking pillows under your chest. ? Your hips should stay down, against the surface that you are lying on. Keep  your hip and back muscles relaxed. 4. Hold for __________ seconds. 5. Slowly relax your upper body and return to the starting position. Repeat __________ times. Complete this exercise __________ times a day. Strengthening exercises These exercises build strength and endurance in your back. Endurance is the ability to use your muscles for a long time, even after they get tired. Exercise D: Pelvic tilt  1. Lie on your back on a firm surface. Bend your knees and keep your feet flat. 2. Tense your abdominal muscles. Tip your pelvis up toward the ceiling and flatten your lower back into the floor. ? To help with this exercise, you may place a small towel under your lower back and try to push your back into the towel. 3. Hold for __________ seconds. 4. Let your muscles relax completely before you repeat this exercise. Repeat __________ times. Complete this exercise __________ times a day. Exercise E: Alternating arm and leg raises    1. Get on your hands and knees on a firm surface. If you are on a hard floor, you may want to use padding to cushion your knees, such as an exercise mat. 2. Line up your arms and legs. Your hands should be below your shoulders, and your knees should be below your hips. 3. Lift your left leg behind you. At the same time, raise your right arm and straighten it in front of you. ? Do not lift your leg higher than your hip. ? Do not lift your arm higher than your shoulder. ? Keep your abdominal and back muscles tight. ? Keep your hips facing the ground. ? Do not arch your back. ? Keep your balance carefully, and do not hold your breath. 4. Hold for __________ seconds. 5. Slowly return to the starting position and repeat with your right leg and your left arm. Repeat __________ times. Complete this exercise __________ times a day. Posture and body mechanics    Body mechanics refers to the movements and positions of your body while you do your daily activities. Posture  is part of body mechanics. Good posture and healthy body mechanics can help to relieve stress  in your body's tissues and joints. Good posture means that your spine is in its natural S-curve position (your spine is neutral), your shoulders are pulled back slightly, and your head is not tipped forward. The following are general guidelines for applying improved posture and body mechanics to your everyday activities. Standing     When standing, keep your spine neutral and your feet about hip-width apart. Keep a slight bend in your knees. Your ears, shoulders, and hips should line up.  When you do a task in which you stand in one place for a long time, place one foot up on a stable object that is 2-4 inches (5-10 cm) high, such as a footstool. This helps keep your spine neutral. Sitting     When sitting, keep your spine neutral and keep your feet flat on the floor. Use a footrest, if necessary, and keep your thighs parallel to the floor. Avoid rounding your shoulders, and avoid tilting your head forward.  When working at a desk or a computer, keep your desk at a height where your hands are slightly lower than your elbows. Slide your chair under your desk so you are close enough to maintain good posture.  When working at a computer, place your monitor at a height where you are looking straight ahead and you do not have to tilt your head forward or downward to look at the screen. Resting     When lying down and resting, avoid positions that are most painful for you.  If you have pain with activities such as sitting, bending, stooping, or squatting (flexion-based activities), lie in a position in which your body does not bend very much. For example, avoid curling up on your side with your arms and knees near your chest (fetal position).  If you have pain with activities such as standing for a long time or reaching with your arms (extension-based activities), lie with your spine in a neutral  position and bend your knees slightly. Try the following positions: ? Lying on your side with a pillow between your knees. ? Lying on your back with a pillow under your knees. Lifting     When lifting objects, keep your feet at least shoulder-width apart and tighten your abdominal muscles.  Bend your knees and hips and keep your spine neutral. It is important to lift using the strength of your legs, not your back. Do not lock your knees straight out.  Always ask for help to lift heavy or awkward objects. This information is not intended to replace advice given to you by your health care provider. Make sure you discuss any questions you have with your health care provider. Document Released: 05/30/2005 Document Revised: 02/04/2016 Document Reviewed: 02/13/2015 [employer] Interactive Patient Education  2017 [employer]          For Headaches:   Stay well hydrated, drink enough water so that your urine is clear, sometimes if you are dehydrated you can get headaches  Eat small frequent meals and snacks, sometimes if you are hungry you can get headaches  Sometimes you get headaches during pregnancy from the pregnancy hormones  You can try tylenol (1-2 regular strength  or 1-2 extra strength ) as directed on the box. The least amount of medication that works is best.   Cool compresses (cool wet washcloth or ice pack) to area of head that is hurting  You can also try drinking a caffeinated drink to see if this will help  If not  helping, try below:  For Prevention of Headaches/Migraines:  CoQ10 100mg  three times daily  Vitamin B2 400mg  daily  Magnesium Oxide 400-600mg  daily  If You Get a Bad Headache/Migraine:  Benadryl 25mg    Magnesium Oxide  1 large Gatorade  2 extra strength Tylenol (1,000mg  total)  1 cup coffee or Coke  If this doesn't help please call us @ [phone]    Second Trimester of Pregnancy The second trimester is from week 14 through  week 27 (months 4 through 6). The second trimester is often a time when you feel your best. Your body has adjusted to being pregnant, and you begin to feel better physically. Usually, morning sickness has lessened or quit completely, you may have more energy, and you may have an increase in appetite. The second trimester is also a time when the fetus is growing rapidly. At the end of the sixth month, the fetus is about 9 inches long and weighs about 1 pounds. You will likely begin to feel the baby move (quickening) between 16 and 20 weeks of pregnancy. Body changes during your second trimester Your body continues to go through many changes during your second trimester. The changes vary from woman to woman.  Your weight will continue to increase. You will notice your lower abdomen bulging out.  You may begin to get stretch marks on your hips, abdomen, and breasts.  You may develop headaches that can be relieved by medicines. The medicines should be approved by your health care provider.  You may urinate more often because the fetus is pressing on your bladder.  You may develop or continue to have heartburn as a result of your pregnancy.  You may develop constipation because certain hormones are causing the muscles that push waste through your intestines to slow down.  You may develop hemorrhoids or swollen, bulging veins (varicose veins).  You may have back pain. This is caused by: ? Weight gain. ? Pregnancy hormones that are relaxing the joints in your pelvis. ? A shift in weight and the muscles that support your balance.  Your breasts will continue to grow and they will continue to become tender.  Your gums may bleed and may be sensitive to brushing and flossing.  Dark spots or blotches (chloasma, mask of pregnancy) may develop on your face. This will likely fade after the baby is born.  A dark line from your belly button to the pubic area (linea nigra) may appear. This will likely  fade after the baby is born.  You may have changes in your hair. These can include thickening of your hair, rapid growth, and changes in texture. Some women also have hair loss during or after pregnancy, or hair that feels dry or thin. Your hair will most likely return to normal after your baby is born.  What to expect at prenatal visits During a routine prenatal visit:  You will be weighed to make sure you and the fetus are growing normally.  Your blood pressure will be taken.  Your abdomen will be measured to track your baby's growth.  The fetal heartbeat will be listened to.  Any test results from the previous visit will be discussed.  Your health care provider may ask you:  How you are feeling.  If you are feeling the baby move.  If you have had any abnormal symptoms, such as leaking fluid, bleeding, severe headaches, or abdominal cramping.  If you are using any tobacco products, including cigarettes, chewing tobacco, and electronic cigarettes.  If you have any questions.  Other tests that may be performed during your second trimester include:  Blood tests that check for: ? Low iron levels (anemia). ? High blood sugar that affects pregnant women (gestational diabetes) between 4 and 28 weeks. ? Rh antibodies. This is to check for a protein on red blood cells (Rh factor).  Urine tests to check for infections, diabetes, or protein in the urine.  An ultrasound to confirm the proper growth and development of the baby.  An amniocentesis to check for possible genetic problems.  Fetal screens for spina bifida and Down syndrome.  HIV (human immunodeficiency virus) testing. Routine prenatal testing includes screening for HIV, unless you choose not to have this test.  Follow these instructions at home: Medicines  Follow your health care provider's instructions regarding medicine use. Specific medicines may be either safe or unsafe to take during pregnancy.  Take a  prenatal vitamin that contains at least 600 micrograms (mcg) of folic acid.  If you develop constipation, try taking a stool softener if your health care provider approves. Eating and drinking  Eat a balanced diet that includes fresh fruits and vegetables, whole grains, good sources of protein such as meat, eggs, or tofu, and low-fat dairy. Your health care provider will help you determine the amount of weight gain that is right for you.  Avoid raw meat and uncooked cheese. These carry germs that can cause birth defects in the baby.  If you have low calcium intake from food, talk to your health care provider about whether you should take a daily calcium supplement.  Limit foods that are high in fat and processed sugars, such as fried and sweet foods.  To prevent constipation: ? Drink enough fluid to keep your urine clear or pale yellow. ? Eat foods that are high in fiber, such as fresh fruits and vegetables, whole grains, and beans. Activity  Exercise only as directed by your health care provider. Most women can continue their usual exercise routine during pregnancy. Try to exercise for 30 minutes at least 5 days a week. Stop exercising if you experience uterine contractions.  Avoid heavy lifting, wear low heel shoes, and practice good posture.  A sexual relationship may be continued unless your health care provider directs you otherwise. Relieving pain and discomfort  Wear a good support bra to prevent discomfort from breast tenderness.  Take warm sitz baths to soothe any pain or discomfort caused by hemorrhoids. Use hemorrhoid cream if your health care provider approves.  Rest with your legs elevated if you have leg cramps or low back pain.  If you develop varicose veins, wear support hose. Elevate your feet for 15 minutes, 3-4 times a day. Limit salt in your diet. Prenatal Care  Write down your questions. Take them to your prenatal visits.  Keep all your prenatal visits as told  by your health care provider. This is important. Safety  Wear your seat belt at all times when driving.  Make a list of emergency phone numbers, including numbers for family, friends, the hospital, and police and fire departments. General instructions  Ask your health care provider for a referral to a local prenatal education class. Begin classes no later than the beginning of month 6 of your pregnancy.  Ask for help if you have counseling or nutritional needs during pregnancy. Your health care provider can offer advice or refer you to specialists for help with various needs.  Do not use hot tubs, steam rooms, or  saunas.  Do not douche or use tampons or scented sanitary pads.  Do not cross your legs for long periods of time.  Avoid cat litter boxes and soil used by cats. These carry germs that can cause birth defects in the baby and possibly loss of the fetus by miscarriage or stillbirth.  Avoid all smoking, herbs, alcohol, and unprescribed drugs. Chemicals in these products can affect the formation and growth of the baby.  Do not use any products that contain nicotine or tobacco, such as cigarettes and e-cigarettes. If you need help quitting, ask your health care provider.  Visit your dentist if you have not gone yet during your pregnancy. Use a soft toothbrush to brush your teeth and be gentle when you floss. Contact a health care provider if:  You have dizziness.  You have mild pelvic cramps, pelvic pressure, or nagging pain in the abdominal area.  You have persistent nausea, vomiting, or diarrhea.  You have a bad smelling vaginal discharge.  You have pain when you urinate. Get help right away if:  You have a fever.  You are leaking fluid from your vagina.  You have spotting or bleeding from your vagina.  You have severe abdominal cramping or pain.  You have rapid weight gain or weight loss.  You have shortness of breath with chest pain.  You notice sudden or  extreme swelling of your face, hands, ankles, feet, or legs.  You have not felt your baby move in over an hour.  You have severe headaches that do not go away when you take medicine.  You have vision changes. Summary  The second trimester is from week 14 through week 27 (months 4 through 6). It is also a time when the fetus is growing rapidly.  Your body goes through many changes during pregnancy. The changes vary from woman to woman.  Avoid all smoking, herbs, alcohol, and unprescribed drugs. These chemicals affect the formation and growth your baby.  Do not use any tobacco products, such as cigarettes, chewing tobacco, and e-cigarettes. If you need help quitting, ask your health care provider.  Contact your health care provider if you have any questions. Keep all prenatal visits as told by your health care provider. This is important. This information is not intended to replace advice given to you by your health care provider. Make sure you discuss any questions you have with your health care provider.      CHILDBIRTH CLASSES [phone] is the phone number for Pregnancy Classes or hospital tours at [facility].   You will be referred to  [email or web address] for more information on childbirth classes  At this site you may register for classes. You may sign up for a waiting list if classes are full. Please SIGN UP FOR THIS!.   When the waiting list becomes long, sometimes new classes can be added.

## 2017-03-21 NOTE — Progress Notes (Signed)
G1P0 [redacted]w[redacted]d Estimated Date of Delivery: 09/04/17  Blood pressure 112/60, pulse 88, weight 215 lb (97.5 kg), last menstrual period 11/28/2016.   BP weight and urine results all reviewed and noted.  Please refer to the obstetrical flow sheet for the fundal height and fetal heart rate documentation:  Patient reports good fetal movement, denies any bleeding and no rupture of membranes symptoms or regular contractions. Patient is without complaints. All questions were answered.  Orders Placed This Encounter  Procedures  . POCT urinalysis dipstick    Plan:  Continued routine obstetrical care,   Return in about 3 weeks (around 04/11/2017) for LROB, [id number].

## 2017-04-10 ENCOUNTER — Other Ambulatory Visit (HOSPITAL_COMMUNITY): Payer: Self-pay | Admitting: Advanced Practice Midwife

## 2017-04-10 DIAGNOSIS — Z363 Encounter for antenatal screening for malformations: Secondary | ICD-10-CM

## 2017-04-11 ENCOUNTER — Ambulatory Visit (INDEPENDENT_AMBULATORY_CARE_PROVIDER_SITE_OTHER): Payer: Federal, State, Local not specified - PPO | Admitting: Women's Health

## 2017-04-11 ENCOUNTER — Encounter: Payer: Self-pay | Admitting: Women's Health

## 2017-04-11 ENCOUNTER — Ambulatory Visit (INDEPENDENT_AMBULATORY_CARE_PROVIDER_SITE_OTHER): Payer: Federal, State, Local not specified - PPO

## 2017-04-11 VITALS — BP 112/70 | HR 100 | Wt 219.0 lb

## 2017-04-11 DIAGNOSIS — Z363 Encounter for antenatal screening for malformations: Secondary | ICD-10-CM | POA: Diagnosis not present

## 2017-04-11 DIAGNOSIS — O9989 Other specified diseases and conditions complicating pregnancy, childbirth and the puerperium: Secondary | ICD-10-CM

## 2017-04-11 DIAGNOSIS — E039 Hypothyroidism, unspecified: Secondary | ICD-10-CM

## 2017-04-11 DIAGNOSIS — Z23 Encounter for immunization: Secondary | ICD-10-CM | POA: Diagnosis not present

## 2017-04-11 DIAGNOSIS — R8271 Bacteriuria: Secondary | ICD-10-CM

## 2017-04-11 DIAGNOSIS — L299 Pruritus, unspecified: Secondary | ICD-10-CM

## 2017-04-11 DIAGNOSIS — Z3402 Encounter for supervision of normal first pregnancy, second trimester: Secondary | ICD-10-CM

## 2017-04-11 DIAGNOSIS — Z331 Pregnant state, incidental: Secondary | ICD-10-CM | POA: Diagnosis not present

## 2017-04-11 DIAGNOSIS — Z1389 Encounter for screening for other disorder: Secondary | ICD-10-CM

## 2017-04-11 DIAGNOSIS — Z3A19 19 weeks gestation of pregnancy: Secondary | ICD-10-CM

## 2017-04-11 LAB — POCT URINALYSIS DIPSTICK
GLUCOSE UA: NEGATIVE
KETONES UA: NEGATIVE
Protein, UA: NEGATIVE

## 2017-04-11 MED ORDER — NITROFURANTOIN MONOHYD MACRO 100 MG PO CAPS: 100.0000 mg | ORAL_CAPSULE | Freq: Two times a day (BID) | ORAL | 0 refills | Status: DC

## 2017-04-11 NOTE — Progress Notes (Signed)
   LOW-RISK PREGNANCY VISIT Patient name: Gloria Lyons MRN [id number]  Date of birth: 01-Dec-1990 Chief Complaint:   Routine Prenatal Visit (ultrasound)  History of Present Illness:   Gloria Lyons is a 26 y.o. G1P0 female at 4763w1d with an Estimated Date of Delivery: 09/04/17 being seen today for ongoing management of a low-risk pregnancy.  Today she reports no fm yet. Denies uti s/s. Has had generalized pruritus, some on soles of feet, possibly worse at night. Not fasting today. denies leaking of fluid. Review of Systems:   Pertinent items are noted in HPI Denies abnormal vaginal discharge w/ itching/odor/irritation, headaches, visual changes, shortness of breath, chest pain, abdominal pain, severe nausea/vomiting, or problems with urination or bowel movements unless otherwise stated above. Pertinent History Reviewed:  Reviewed past medical,surgical, social, obstetrical and family history.  Reviewed problem list, medications and allergies. Physical Assessment:   Vitals:   04/11/17 1520  BP: 112/70  Pulse: 100  Weight: 219 lb (99.3 kg)  Body mass index is 36.73 kg/m.        Physical Examination:   General appearance: Well appearing, and in no distress  Mental status: Alert, oriented to person, place, and time  Skin: Warm & dry  Cardiovascular: Normal heart rate noted  Respiratory: Normal respiratory effort, no distress  Abdomen: Soft, gravid, nontender  Pelvic: Cervical exam deferred         Extremities: Edema: None  Fetal Status: Fetal Heart Rate (bpm): 164 u/s   Movement: Absent   US 19+1 wks,breech,post pl gr 0,cx 4.2 cm,svp of fluid 5.3 cm,normal ovaries bilat,fhr 164 bpm,EFW 315 g,anatomy complete,no obvious abnormalities  Results for orders placed or performed in visit on 04/11/17 (from the past 24 hour(s))  POCT Urinalysis Dipstick   Collection Time: 04/11/17  4:21 PM  Result Value Ref Range   Color, UA     Clarity, UA     Glucose, UA neg    Bilirubin, UA     Ketones, UA neg    Spec Grav, UA  1.010 - 1.025   Blood, UA moderate    pH, UA  5.0 - 8.0   Protein, UA neg    Urobilinogen, UA  0.2 or 1.0 E.U./dL   Nitrite, UA postive    Leukocytes, UA Moderate (2+) (A) Negative    Assessment & Plan:  1) Low-risk pregnancy G1P0 at 8563w1d with an Estimated Date of Delivery: 09/04/17   2) ASB, rx macrobid, send urine cx  3) Generalized pruritus> will get fasting bile acids/cmp, come back one am this week-npo after mn. Can use hydrocortisone/benadryl cream, benadryl po, cool washcloths/showers, avoid hot  4) Hypothyroidism> no meds, 1st trimester TSH normal, will repeat next visit   Labs/procedures today: flu shot, anatomy u/s  Plan:  Continue routine obstetrical care   Reviewed: today's normal u/s, Preterm labor symptoms and general obstetric precautions including but not limited to vaginal bleeding, contractions, leaking of fluid and fetal movement were reviewed in detail with the patient.  All questions were answered  Follow-up: Return in about 4 weeks (around 05/09/2017) for LROB; this week am for fasting labs.  Orders Placed This Encounter  Procedures  . Urine Culture  . Flu Vaccine QUAD 36+ mos IM  . Bile acids, total  . Comprehensive metabolic panel  . POCT Urinalysis Dipstick   Marge DuncansBooker, Ann Groeneveld Randall CNM, [facility]NP-BC 04/11/2017 5:05 PM

## 2017-04-11 NOTE — Patient Instructions (Signed)
Gloria DurhamAshley [name], I greatly value your feedback.  If you receive a survey following your visit with us today, we appreciate you taking the time to fill it out.  Thanks, Gloria HaffKim Kaidence Lyons, CNM, WHNP-BC   Second Trimester of Pregnancy The second trimester is from week 14 through week 27 (months 4 through 6). The second trimester is often a time when you feel your best. Your body has adjusted to being pregnant, and you begin to feel better physically. Usually, morning sickness has lessened or quit completely, you may have more energy, and you may have an increase in appetite. The second trimester is also a time when the fetus is growing rapidly. At the end of the sixth month, the fetus is about 9 inches long and weighs about 1 pounds. You will likely begin to feel the baby move (quickening) between 16 and 20 weeks of pregnancy. Body changes during your second trimester Your body continues to go through many changes during your second trimester. The changes vary from woman to woman.  Your weight will continue to increase. You will notice your lower abdomen bulging out.  You may begin to get stretch marks on your hips, abdomen, and breasts.  You may develop headaches that can be relieved by medicines. The medicines should be approved by your health care provider.  You may urinate more often because the fetus is pressing on your bladder.  You may develop or continue to have heartburn as a result of your pregnancy.  You may develop constipation because certain hormones are causing the muscles that push waste through your intestines to slow down.  You may develop hemorrhoids or swollen, bulging veins (varicose veins).  You may have back pain. This is caused by: ? Weight gain. ? Pregnancy hormones that are relaxing the joints in your pelvis. ? A shift in weight and the muscles that support your balance.  Your breasts will continue to grow and they will continue to become tender.  Your gums may bleed  and may be sensitive to brushing and flossing.  Dark spots or blotches (chloasma, mask of pregnancy) may develop on your face. This will likely fade after the baby is born.  A dark line from your belly button to the pubic area (linea nigra) may appear. This will likely fade after the baby is born.  You may have changes in your hair. These can include thickening of your hair, rapid growth, and changes in texture. Some women also have hair loss during or after pregnancy, or hair that feels dry or thin. Your hair will most likely return to normal after your baby is born.  What to expect at prenatal visits During a routine prenatal visit:  You will be weighed to make sure you and the fetus are growing normally.  Your blood pressure will be taken.  Your abdomen will be measured to track your baby's growth.  The fetal heartbeat will be listened to.  Any test results from the previous visit will be discussed.  Your health care provider may ask you:  How you are feeling.  If you are feeling the baby move.  If you have had any abnormal symptoms, such as leaking fluid, bleeding, severe headaches, or abdominal cramping.  If you are using any tobacco products, including cigarettes, chewing tobacco, and electronic cigarettes.  If you have any questions.  Other tests that may be performed during your second trimester include:  Blood tests that check for: ? Low iron levels (anemia). ? High  blood sugar that affects pregnant women (gestational diabetes) between 3 and 28 weeks. ? Rh antibodies. This is to check for a protein on red blood cells (Rh factor).  Urine tests to check for infections, diabetes, or protein in the urine.  An ultrasound to confirm the proper growth and development of the baby.  An amniocentesis to check for possible genetic problems.  Fetal screens for spina bifida and Down syndrome.  HIV (human immunodeficiency virus) testing. Routine prenatal testing includes  screening for HIV, unless you choose not to have this test.  Follow these instructions at home: Medicines  Follow your health care provider's instructions regarding medicine use. Specific medicines may be either safe or unsafe to take during pregnancy.  Take a prenatal vitamin that contains at least 600 micrograms (mcg) of folic acid.  If you develop constipation, try taking a stool softener if your health care provider approves. Eating and drinking  Eat a balanced diet that includes fresh fruits and vegetables, whole grains, good sources of protein such as meat, eggs, or tofu, and low-fat dairy. Your health care provider will help you determine the amount of weight gain that is right for you.  Avoid raw meat and uncooked cheese. These carry germs that can cause birth defects in the baby.  If you have low calcium intake from food, talk to your health care provider about whether you should take a daily calcium supplement.  Limit foods that are high in fat and processed sugars, such as fried and sweet foods.  To prevent constipation: ? Drink enough fluid to keep your urine clear or pale yellow. ? Eat foods that are high in fiber, such as fresh fruits and vegetables, whole grains, and beans. Activity  Exercise only as directed by your health care provider. Most women can continue their usual exercise routine during pregnancy. Try to exercise for 30 minutes at least 5 days a week. Stop exercising if you experience uterine contractions.  Avoid heavy lifting, wear low heel shoes, and practice good posture.  A sexual relationship may be continued unless your health care provider directs you otherwise. Relieving pain and discomfort  Wear a good support bra to prevent discomfort from breast tenderness.  Take warm sitz baths to soothe any pain or discomfort caused by hemorrhoids. Use hemorrhoid cream if your health care provider approves.  Rest with your legs elevated if you have leg cramps  or low back pain.  If you develop varicose veins, wear support hose. Elevate your feet for 15 minutes, 3-4 times a day. Limit salt in your diet. Prenatal Care  Write down your questions. Take them to your prenatal visits.  Keep all your prenatal visits as told by your health care provider. This is important. Safety  Wear your seat belt at all times when driving.  Make a list of emergency phone numbers, including numbers for family, friends, the hospital, and police and fire departments. General instructions  Ask your health care provider for a referral to a local prenatal education class. Begin classes no later than the beginning of month 6 of your pregnancy.  Ask for help if you have counseling or nutritional needs during pregnancy. Your health care provider can offer advice or refer you to specialists for help with various needs.  Do not use hot tubs, steam rooms, or saunas.  Do not douche or use tampons or scented sanitary pads.  Do not cross your legs for long periods of time.  Avoid cat litter boxes and soil  used by cats. These carry germs that can cause birth defects in the baby and possibly loss of the fetus by miscarriage or stillbirth.  Avoid all smoking, herbs, alcohol, and unprescribed drugs. Chemicals in these products can affect the formation and growth of the baby.  Do not use any products that contain nicotine or tobacco, such as cigarettes and e-cigarettes. If you need help quitting, ask your health care provider.  Visit your dentist if you have not gone yet during your pregnancy. Use a soft toothbrush to brush your teeth and be gentle when you floss. Contact a health care provider if:  You have dizziness.  You have mild pelvic cramps, pelvic pressure, or nagging pain in the abdominal area.  You have persistent nausea, vomiting, or diarrhea.  You have a bad smelling vaginal discharge.  You have pain when you urinate. Get help right away if:  You have a  fever.  You are leaking fluid from your vagina.  You have spotting or bleeding from your vagina.  You have severe abdominal cramping or pain.  You have rapid weight gain or weight loss.  You have shortness of breath with chest pain.  You notice sudden or extreme swelling of your face, hands, ankles, feet, or legs.  You have not felt your baby move in over an hour.  You have severe headaches that do not go away when you take medicine.  You have vision changes. Summary  The second trimester is from week 14 through week 27 (months 4 through 6). It is also a time when the fetus is growing rapidly.  Your body goes through many changes during pregnancy. The changes vary from woman to woman.  Avoid all smoking, herbs, alcohol, and unprescribed drugs. These chemicals affect the formation and growth your baby.  Do not use any tobacco products, such as cigarettes, chewing tobacco, and e-cigarettes. If you need help quitting, ask your health care provider.  Contact your health care provider if you have any questions. Keep all prenatal visits as told by your health care provider. This is important. This information is not intended to replace advice given to you by your health care provider. Make sure you discuss any questions you have with your health care provider. Document Released: 05/24/2001 Document Revised: 11/05/2015 Document Reviewed: 07/31/2012 Elsevier Interactive Patient Education  2017 [employer].

## 2017-04-11 NOTE — Progress Notes (Signed)
US 19+1 wks,breech,post pl gr 0,cx 4.2 cm,svp of fluid 5.3 cm,normal ovaries bilat,fhr 164 bpm,EFW 315 g,anatomy complete,no obvious abnormalities

## 2017-04-13 ENCOUNTER — Other Ambulatory Visit: Payer: Federal, State, Local not specified - PPO

## 2017-04-13 DIAGNOSIS — L299 Pruritus, unspecified: Secondary | ICD-10-CM | POA: Diagnosis not present

## 2017-04-13 DIAGNOSIS — Z331 Pregnant state, incidental: Secondary | ICD-10-CM | POA: Diagnosis not present

## 2017-04-13 LAB — URINE CULTURE: ORGANISM ID, BACTERIA: NO GROWTH

## 2017-04-14 LAB — COMPREHENSIVE METABOLIC PANEL
ALT: 9 IU/L (ref 0–32)
AST: 12 IU/L (ref 0–40)
Albumin/Globulin Ratio: 2.1 (ref 1.2–2.2)
Albumin: 3.9 g/dL (ref 3.5–5.5)
Alkaline Phosphatase: 41 IU/L (ref 39–117)
BILIRUBIN TOTAL: 0.3 mg/dL (ref 0.0–1.2)
BUN/Creatinine Ratio: 10 (ref 9–23)
BUN: 5 mg/dL — AB (ref 6–20)
CALCIUM: 9 mg/dL (ref 8.7–10.2)
CHLORIDE: 105 mmol/L (ref 96–106)
CO2: 21 mmol/L (ref 20–29)
Creatinine, Ser: 0.51 mg/dL — ABNORMAL LOW (ref 0.57–1.00)
GFR calc non Af Amer: 132 mL/min/{1.73_m2} (ref >59)
GFR, EST AFRICAN AMERICAN: 152 mL/min/{1.73_m2} (ref >59)
GLUCOSE: 85 mg/dL (ref 65–99)
Globulin, Total: 1.9 g/dL (ref 1.5–4.5)
POTASSIUM: 4 mmol/L (ref 3.5–5.2)
Sodium: 140 mmol/L (ref 134–144)
TOTAL PROTEIN: 5.8 g/dL — AB (ref 6.0–8.5)

## 2017-04-14 LAB — BILE ACIDS, TOTAL: BILE ACIDS TOTAL: 6.7 umol/L (ref 4.7–24.5)

## 2017-05-09 ENCOUNTER — Ambulatory Visit (INDEPENDENT_AMBULATORY_CARE_PROVIDER_SITE_OTHER): Payer: Federal, State, Local not specified - PPO | Admitting: Obstetrics & Gynecology

## 2017-05-09 ENCOUNTER — Encounter: Payer: Self-pay | Admitting: Obstetrics & Gynecology

## 2017-05-09 VITALS — BP 116/76 | HR 85 | Wt 221.0 lb

## 2017-05-09 DIAGNOSIS — Z331 Pregnant state, incidental: Secondary | ICD-10-CM

## 2017-05-09 DIAGNOSIS — R8290 Unspecified abnormal findings in urine: Secondary | ICD-10-CM | POA: Diagnosis not present

## 2017-05-09 DIAGNOSIS — O288 Other abnormal findings on antenatal screening of mother: Secondary | ICD-10-CM

## 2017-05-09 DIAGNOSIS — Z3A23 23 weeks gestation of pregnancy: Secondary | ICD-10-CM

## 2017-05-09 DIAGNOSIS — Z3402 Encounter for supervision of normal first pregnancy, second trimester: Secondary | ICD-10-CM

## 2017-05-09 DIAGNOSIS — Z1389 Encounter for screening for other disorder: Secondary | ICD-10-CM

## 2017-05-09 LAB — POCT URINALYSIS DIPSTICK
Blood, UA: NEGATIVE
Glucose, UA: NEGATIVE
KETONES UA: NEGATIVE
Leukocytes, UA: NEGATIVE
Nitrite, UA: POSITIVE
Protein, UA: NEGATIVE

## 2017-05-09 NOTE — Progress Notes (Signed)
G1P0 2614w1d Estimated Date of Delivery: 09/04/17  Blood pressure 116/76, pulse 85, weight 221 lb (100.2 kg), last menstrual period 11/28/2016.   BP weight and urine results all reviewed and noted.  Please refer to the obstetrical flow sheet for the fundal height and fetal heart rate documentation:  Patient reports good fetal movement, denies any bleeding and no rupture of membranes symptoms or regular contractions. Patient is without complaints. All questions were answered.  Orders Placed This Encounter  Procedures  . Urine Culture  . POCT urinalysis dipstick    Plan:  Continued routine obstetrical care, +nitrates again, will await culture results then treat PN2 next visit  Return in about 4 weeks (around 06/06/2017) for PN2, LROB.

## 2017-05-11 LAB — URINE CULTURE: ORGANISM ID, BACTERIA: NO GROWTH

## 2017-05-18 ENCOUNTER — Encounter: Payer: Self-pay | Admitting: Advanced Practice Midwife

## 2017-05-28 ENCOUNTER — Encounter: Payer: Self-pay | Admitting: Adult Health

## 2017-05-29 ENCOUNTER — Other Ambulatory Visit: Payer: Self-pay | Admitting: Women's Health

## 2017-06-08 ENCOUNTER — Other Ambulatory Visit: Payer: Self-pay

## 2017-06-08 ENCOUNTER — Encounter: Payer: Self-pay | Admitting: Advanced Practice Midwife

## 2017-06-08 ENCOUNTER — Other Ambulatory Visit: Payer: Federal, State, Local not specified - PPO

## 2017-06-08 ENCOUNTER — Ambulatory Visit (INDEPENDENT_AMBULATORY_CARE_PROVIDER_SITE_OTHER): Payer: Federal, State, Local not specified - PPO | Admitting: Advanced Practice Midwife

## 2017-06-08 VITALS — BP 112/66 | HR 96 | Wt 222.0 lb

## 2017-06-08 DIAGNOSIS — Z1389 Encounter for screening for other disorder: Secondary | ICD-10-CM | POA: Diagnosis not present

## 2017-06-08 DIAGNOSIS — E039 Hypothyroidism, unspecified: Secondary | ICD-10-CM

## 2017-06-08 DIAGNOSIS — Z131 Encounter for screening for diabetes mellitus: Secondary | ICD-10-CM

## 2017-06-08 DIAGNOSIS — Z3402 Encounter for supervision of normal first pregnancy, second trimester: Secondary | ICD-10-CM | POA: Diagnosis not present

## 2017-06-08 DIAGNOSIS — Z331 Pregnant state, incidental: Secondary | ICD-10-CM

## 2017-06-08 DIAGNOSIS — Z3A27 27 weeks gestation of pregnancy: Secondary | ICD-10-CM

## 2017-06-08 DIAGNOSIS — O99282 Endocrine, nutritional and metabolic diseases complicating pregnancy, second trimester: Secondary | ICD-10-CM

## 2017-06-08 LAB — POCT URINALYSIS DIPSTICK
Blood, UA: NEGATIVE
Glucose, UA: NEGATIVE
KETONES UA: NEGATIVE
NITRITE UA: POSITIVE
PROTEIN UA: NEGATIVE

## 2017-06-08 MED ORDER — NITROFURANTOIN MONOHYD MACRO 100 MG PO CAPS: 100.0000 mg | ORAL_CAPSULE | Freq: Two times a day (BID) | ORAL | 0 refills | Status: DC

## 2017-06-08 NOTE — Progress Notes (Signed)
LOW-RISK PREGNANCY VISIT Patient name: Gloria Lyons MRN [id number]  Date of birth: 1990/08/02 Chief Complaint:   Routine Prenatal Visit (PN2 today)  History of Present Illness:   Gloria Lyons is a 26 y.o. G1P0 female at 1947w3d with an Estimated Date of Delivery: 09/04/17 being seen today for ongoing management of a low-risk pregnancy.  Today she reports still having sciatia/hip pain. Hasn't tried K. Tape yet, will start stretches.. No UTI sx, no fever, CVAT. Interestingly, she always has nitrites and never grows bacteria. Will culture only.  Contractions: Not present. Vag. Bleeding: None.  Movement: Present. denies leaking of fluid. Review of Systems:   Pertinent items are noted in HPI Denies abnormal vaginal discharge w/ itching/odor/irritation, headaches, visual changes, shortness of breath, chest pain, abdominal pain, severe nausea/vomiting, or problems with urination or bowel movements unless otherwise stated above.  Pertinent History Reviewed:  Medical & Surgical Hx:   Past Medical History:  Diagnosis Date  . Hypothyroidism    History reviewed. No pertinent surgical history. Family History  Problem Relation Age of Onset  . Cancer Paternal Grandfather   . Diabetes Paternal Grandfather   . Cancer Maternal Grandmother   . Diabetes Maternal Grandmother   . Diabetes Maternal Grandfather   . Leukemia Father   . Hypertension Father   . Heart attack Mother   . Seizures Mother   . Hypertension Mother     Current Outpatient Medications:  .  Prenatal MV-Min-FA-Omega-3 (PRENATAL GUMMIES/DHA & FA) 0.4-32.5 MG CHEW, Chew 2 capsules by mouth daily., Disp: , Rfl:  .  Doxylamine-Pyridoxine ER (BONJESTA) 20-20 MG TBCR, Take 1 tablet by mouth 2 (two) times daily at 8 am and 10 pm. (Patient not taking: Reported on 04/11/2017), Disp: 36 tablet, Rfl: 0 .  ondansetron (ZOFRAN ODT) 4 MG disintegrating tablet, Take 1 tablet (4 mg total) by mouth every 6 (six) hours as needed for nausea.  (Patient not taking: Reported on 04/11/2017), Disp: 30 tablet, Rfl: 2 Social History: Reviewed -  reports that she is a non-smoker but has been exposed to tobacco smoke. she has never used smokeless tobacco.  Physical Assessment:   Vitals:   06/08/17 0920  BP: 112/66  Pulse: 96  Weight: 222 lb (100.7 kg)  Body mass index is 37.23 kg/m.        Physical Examination:   General appearance: Well appearing, and in no distress  Mental status: Alert, oriented to person, place, and time  Skin: Warm & dry  Cardiovascular: Normal heart rate noted  Respiratory: Normal respiratory effort, no distress  Abdomen: Soft, gravid, nontender  Pelvic: Cervical exam deferred         Extremities: Edema: Trace  Fetal Status: Fetal Heart Rate (bpm): 145 Fundal Height: 27 cm Movement: Present    Results for orders placed or performed in visit on 06/08/17 (from the past 24 hour(s))  POCT urinalysis dipstick   Collection Time: 06/08/17  9:21 AM  Result Value Ref Range   Color, UA     Clarity, UA     Glucose, UA neg    Bilirubin, UA     Ketones, UA neg    Spec Grav, UA  1.010 - 1.025   Blood, UA neg    pH, UA  5.0 - 8.0   Protein, UA neg    Urobilinogen, UA  0.2 or 1.0 E.U./dL   Nitrite, UA positive    Leukocytes, UA Moderate (2+) (A) Negative   Appearance     Odor  Assessment & Plan:  1) Low-risk pregnancy G1P0 at 1552w3d with an Estimated Date of Delivery: 09/04/17   2) hypothyroidism,   3) persistant nitrites w/o bacteria.    Labs/procedures/US today: PN2, TSH  Plan:  Continue routine obstetrical care   Follow-up: Return in about 3 weeks (around 06/29/2017) for LROB.  Orders Placed This Encounter  Procedures  . Urine Culture  . TSH  . POCT urinalysis dipstick   CRESENZO-DISHMAN,Bryndle Corredor CNM 06/08/2017 9:54 AM

## 2017-06-08 NOTE — Patient Instructions (Signed)
Gloria Lyons, I greatly value your feedback.  If you receive a survey following your visit with us today, we appreciate you taking the time to fill it out.  Thanks, Gloria Lyons, CNM   Call the office [phone]) or go to [facility] if:  You begin to have strong, frequent contractions  Your water breaks.  Sometimes it is a big gush of fluid, sometimes it is just a trickle that keeps getting your panties wet or running down your legs  You have vaginal bleeding.  It is normal to have a small amount of spotting if your cervix was checked.   You don't feel your baby moving like normal.  If you don't, get you something to eat and drink and lay down and focus on feeling your baby move.  You should feel at least 10 movements in 2 hours.  If you don't, you should call the office or [facility] to [facility].    Tdap Vaccine  It is recommended that you get the Tdap vaccine during the third trimester of EACH pregnancy to help protect your baby from getting pertussis (whooping cough)  27-36 weeks is the BEST time to do this so that you can pass the protection on to your baby. During pregnancy is better than after pregnancy, but if you are unable to get it during pregnancy it will be offered at the hospital.   You can get this vaccine at the health department or your family doctor  Everyone who will be around your baby should also be up-to-date on their vaccines. Adults (who are not pregnant) only need 1 dose of Tdap during adulthood.   Third Trimester of Pregnancy The third trimester is from week 29 through week 42, months 7 through 9. The third trimester is a time when the fetus is growing rapidly. At the end of the ninth month, the fetus is about 20 inches in length and weighs 6-10 pounds.  BODY CHANGES Your body goes through many changes during pregnancy. The changes vary from woman to woman.   Your weight will continue to increase. You can expect to gain 25-35 pounds (11-16 kg) by  the end of the pregnancy.  You may begin to get stretch marks on your hips, abdomen, and breasts.  You may urinate more often because the fetus is moving lower into your pelvis and pressing on your bladder.  You may develop or continue to have heartburn as a result of your pregnancy.  You may develop constipation because certain hormones are causing the muscles that push waste through your intestines to slow down.  You may develop hemorrhoids or swollen, bulging veins (varicose veins).  You may have pelvic pain because of the weight gain and pregnancy hormones relaxing your joints between the bones in your pelvis. Backaches may result from overexertion of the muscles supporting your posture.  You may have changes in your hair. These can include thickening of your hair, rapid growth, and changes in texture. Some women also have hair loss during or after pregnancy, or hair that feels dry or thin. Your hair will most likely return to normal after your baby is born.  Your breasts will continue to grow and be tender. A yellow discharge may leak from your breasts called colostrum.  Your belly button may stick out.  You may feel short of breath because of your expanding uterus.  You may notice the fetus "dropping," or moving lower in your abdomen.  You may have a bloody mucus  discharge. This usually occurs a few days to a week before labor begins.  Your cervix becomes thin and soft (effaced) near your due date. WHAT TO EXPECT AT YOUR PRENATAL EXAMS  You will have prenatal exams every 2 weeks until week 36. Then, you will have weekly prenatal exams. During a routine prenatal visit:  You will be weighed to make sure you and the fetus are growing normally.  Your blood pressure is taken.  Your abdomen will be measured to track your baby's growth.  The fetal heartbeat will be listened to.  Any test results from the previous visit will be discussed.  You may have a cervical check near your  due date to see if you have effaced. At around 36 weeks, your caregiver will check your cervix. At the same time, your caregiver will also perform a test on the secretions of the vaginal tissue. This test is to determine if a type of bacteria, Group B streptococcus, is present. Your caregiver will explain this further. Your caregiver may ask you:  What your birth plan is.  How you are feeling.  If you are feeling the baby move.  If you have had any abnormal symptoms, such as leaking fluid, bleeding, severe headaches, or abdominal cramping.  If you have any questions. Other tests or screenings that may be performed during your third trimester include:  Blood tests that check for low iron levels (anemia).  Fetal testing to check the health, activity level, and growth of the fetus. Testing is done if you have certain medical conditions or if there are problems during the pregnancy. FALSE LABOR You may feel small, irregular contractions that eventually go away. These are called Braxton Hicks contractions, or false labor. Contractions may last for hours, days, or even weeks before true labor sets in. If contractions come at regular intervals, intensify, or become painful, it is best to be seen by your caregiver.  SIGNS OF LABOR   Menstrual-like cramps.  Contractions that are 5 minutes apart or less.  Contractions that start on the top of the uterus and spread down to the lower abdomen and back.  A sense of increased pelvic pressure or back pain.  A watery or bloody mucus discharge that comes from the vagina. If you have any of these signs before the 37th week of pregnancy, call your caregiver right away. You need to go to the hospital to get checked immediately. HOME CARE INSTRUCTIONS   Avoid all smoking, herbs, alcohol, and unprescribed drugs. These chemicals affect the formation and growth of the baby.  Follow your caregiver's instructions regarding medicine use. There are medicines  that are either safe or unsafe to take during pregnancy.  Exercise only as directed by your caregiver. Experiencing uterine cramps is a good sign to stop exercising.  Continue to eat regular, healthy meals.  Wear a good support bra for breast tenderness.  Do not use hot tubs, steam rooms, or saunas.  Wear your seat belt at all times when driving.  Avoid raw meat, uncooked cheese, cat litter boxes, and soil used by cats. These carry germs that can cause birth defects in the baby.  Take your prenatal vitamins.  Try taking a stool softener (if your caregiver approves) if you develop constipation. Eat more high-fiber foods, such as fresh vegetables or fruit and whole grains. Drink plenty of fluids to keep your urine clear or pale yellow.  Take warm sitz baths to soothe any pain or discomfort caused by hemorrhoids. Use  hemorrhoid cream if your caregiver approves.  If you develop varicose veins, wear support hose. Elevate your feet for 15 minutes, 3-4 times a day. Limit salt in your diet.  Avoid heavy lifting, wear low heal shoes, and practice good posture.  Rest a lot with your legs elevated if you have leg cramps or low back pain.  Visit your dentist if you have not gone during your pregnancy. Use a soft toothbrush to brush your teeth and be gentle when you floss.  A sexual relationship may be continued unless your caregiver directs you otherwise.  Do not travel far distances unless it is absolutely necessary and only with the approval of your caregiver.  Take prenatal classes to understand, practice, and ask questions about the labor and delivery.  Make a trial run to the hospital.  Pack your hospital bag.  Prepare the baby's nursery.  Continue to go to all your prenatal visits as directed by your caregiver. SEEK MEDICAL CARE IF:  You are unsure if you are in labor or if your water has broken.  You have dizziness.  You have mild pelvic cramps, pelvic pressure, or nagging  pain in your abdominal area.  You have persistent nausea, vomiting, or diarrhea.  You have a bad smelling vaginal discharge.  You have pain with urination. SEEK IMMEDIATE MEDICAL CARE IF:   You have a fever.  You are leaking fluid from your vagina.  You have spotting or bleeding from your vagina.  You have severe abdominal cramping or pain.  You have rapid weight loss or gain.  You have shortness of breath with chest pain.  You notice sudden or extreme swelling of your face, hands, ankles, feet, or legs.  You have not felt your baby move in over an hour.  You have severe headaches that do not go away with medicine.  You have vision changes. Document Released: 05/24/2001 Document Revised: 06/04/2013 Document Reviewed: 07/31/2012 [facility] Patient Information 2015 [place], [place]. This information is not intended to replace advice given to you by your health care provider. Make sure you discuss any questions you have with your health care provider.

## 2017-06-09 LAB — RPR: RPR Ser Ql: NONREACTIVE

## 2017-06-09 LAB — GLUCOSE TOLERANCE, 2 HOURS W/ 1HR
GLUCOSE, 2 HOUR: 108 mg/dL (ref 65–152)
GLUCOSE, FASTING: 81 mg/dL (ref 65–91)
Glucose, 1 hour: 133 mg/dL (ref 65–179)

## 2017-06-09 LAB — CBC
Hematocrit: 32.4 % — ABNORMAL LOW (ref 34.0–46.6)
Hemoglobin: 10.5 g/dL — ABNORMAL LOW (ref 11.1–15.9)
MCH: 27.7 pg (ref 26.6–33.0)
MCHC: 32.4 g/dL (ref 31.5–35.7)
MCV: 86 fL (ref 79–97)
PLATELETS: 172 10*3/uL (ref 150–379)
RBC: 3.79 x10E6/uL (ref 3.77–5.28)
RDW: 13.5 % (ref 12.3–15.4)
WBC: 7.9 10*3/uL (ref 3.4–10.8)

## 2017-06-09 LAB — HIV ANTIBODY (ROUTINE TESTING W REFLEX): HIV SCREEN 4TH GENERATION: NONREACTIVE

## 2017-06-09 LAB — ANTIBODY SCREEN: ANTIBODY SCREEN: NEGATIVE

## 2017-06-10 LAB — URINE CULTURE

## 2017-06-12 DIAGNOSIS — E039 Hypothyroidism, unspecified: Secondary | ICD-10-CM | POA: Diagnosis not present

## 2017-06-13 LAB — TSH: TSH: 1.15 u[IU]/mL (ref 0.450–4.500)

## 2017-06-13 NOTE — L&D Delivery Note (Signed)
Delivery Note At 9:17 AM a viable female was delivered via Vaginal, Spontaneous (Presentation:LOA ).  APGAR: 9, 9; weight Pending. After 1 minute, the cord was clamped and cut. 40 units of pitocin diluted in 1000cc LR was infused rapidly IV.  The placenta separated spontaneously and delivered via CCT and maternal pushing effort.  It was inspected and appears to be intact with a 3 VC .  Repair by Dr. Ralph LeydenA. Rumball under my direct supervision  Anesthesia:  Epidural and local Episiotomy: None Lacerations:  2nd degree vaginal Suture Repair: 2.0 vicryl Est. Blood Loss (mL):  350  Mom to postpartum.  Baby to Couplet care / Skin to Skin.  Gloria ShellFrances Lyons 08/25/2017, 9:51 AM  IOL for preeclampsia, pitocin only    Please schedule this patient for PP visit in: 4 weeks Low risk pregnancy complicated by: preeclampsia Delivery mode:  SVD Anticipated Birth Control:  POPs PP Procedures needed: BP check in one week Schedule Integrated BH visit: no Provider: Any provider

## 2017-06-29 ENCOUNTER — Encounter: Payer: Self-pay | Admitting: Advanced Practice Midwife

## 2017-06-29 ENCOUNTER — Ambulatory Visit (INDEPENDENT_AMBULATORY_CARE_PROVIDER_SITE_OTHER): Payer: Federal, State, Local not specified - PPO | Admitting: Advanced Practice Midwife

## 2017-06-29 VITALS — BP 128/84 | HR 99 | Wt 225.0 lb

## 2017-06-29 DIAGNOSIS — Z3A30 30 weeks gestation of pregnancy: Secondary | ICD-10-CM

## 2017-06-29 DIAGNOSIS — Z1389 Encounter for screening for other disorder: Secondary | ICD-10-CM

## 2017-06-29 DIAGNOSIS — Z331 Pregnant state, incidental: Secondary | ICD-10-CM

## 2017-06-29 DIAGNOSIS — Z3403 Encounter for supervision of normal first pregnancy, third trimester: Secondary | ICD-10-CM

## 2017-06-29 LAB — POCT URINALYSIS DIPSTICK
GLUCOSE UA: NEGATIVE
KETONES UA: NEGATIVE
Nitrite, UA: POSITIVE
Protein, UA: NEGATIVE
RBC UA: NEGATIVE

## 2017-06-29 NOTE — Progress Notes (Signed)
G1P0 3464w3d Estimated Date of Delivery: 09/04/17  Blood pressure 128/84, pulse 99, weight 225 lb (102.1 kg), last menstrual period 11/28/2016.   BP weight and urine results all reviewed and noted.  Please refer to the obstetrical flow sheet for the fundal height and fetal heart rate documentation:  Patient reports good fetal movement, denies any bleeding and no rupture of membranes symptoms or regular contractions. Patient is without complaints.   All questions were answered.  Orders Placed This Encounter  Procedures  . POCT urinalysis dipstick    Plan:  Continued routine obstetrical care,   Return in about 2 weeks (around 07/13/2017) for LROB.

## 2017-06-29 NOTE — Addendum Note (Signed)
Addended by: Moss McRESENZO, Kyle Luppino M on: 06/29/2017 05:07 PM   Modules accepted: Orders

## 2017-06-30 DIAGNOSIS — Z3403 Encounter for supervision of normal first pregnancy, third trimester: Secondary | ICD-10-CM | POA: Diagnosis not present

## 2017-07-02 LAB — URINE CULTURE

## 2017-07-11 DIAGNOSIS — Z029 Encounter for administrative examinations, unspecified: Secondary | ICD-10-CM

## 2017-07-13 ENCOUNTER — Encounter: Payer: Self-pay | Admitting: Women's Health

## 2017-07-13 ENCOUNTER — Ambulatory Visit (INDEPENDENT_AMBULATORY_CARE_PROVIDER_SITE_OTHER): Payer: Federal, State, Local not specified - PPO | Admitting: Women's Health

## 2017-07-13 VITALS — BP 122/78 | HR 80 | Wt 225.0 lb

## 2017-07-13 DIAGNOSIS — Z3403 Encounter for supervision of normal first pregnancy, third trimester: Secondary | ICD-10-CM

## 2017-07-13 DIAGNOSIS — A491 Streptococcal infection, unspecified site: Secondary | ICD-10-CM

## 2017-07-13 DIAGNOSIS — Z331 Pregnant state, incidental: Secondary | ICD-10-CM

## 2017-07-13 DIAGNOSIS — Z3A32 32 weeks gestation of pregnancy: Secondary | ICD-10-CM

## 2017-07-13 DIAGNOSIS — Z1389 Encounter for screening for other disorder: Secondary | ICD-10-CM

## 2017-07-13 DIAGNOSIS — O98813 Other maternal infectious and parasitic diseases complicating pregnancy, third trimester: Secondary | ICD-10-CM

## 2017-07-13 LAB — POCT URINALYSIS DIPSTICK
Glucose, UA: NEGATIVE
KETONES UA: NEGATIVE
Leukocytes, UA: NEGATIVE
Nitrite, UA: POSITIVE
Protein, UA: NEGATIVE
RBC UA: NEGATIVE

## 2017-07-13 NOTE — Patient Instructions (Signed)
Gloria DurhamAshley [name], I greatly value your feedback.  If you receive a survey following your visit with us today, we appreciate you taking the time to fill it out.  Thanks, Joellyn HaffKim Jalexis Breed, CNM, WHNP-BC   Call the office [phone]) or go to [facility] if:  You begin to have strong, frequent contractions  Your water breaks.  Sometimes it is a big gush of fluid, sometimes it is just a trickle that keeps getting your panties wet or running down your legs  You have vaginal bleeding.  It is normal to have a small amount of spotting if your cervix was checked.   You don't feel your baby moving like normal.  If you don't, get you something to eat and drink and lay down and focus on feeling your baby move.  You should feel at least 10 movements in 2 hours.  If you don't, you should call the office or go to [facility].    Tdap Vaccine  It is recommended that you get the Tdap vaccine during the third trimester of EACH pregnancy to help protect your baby from getting pertussis (whooping cough)  27-36 weeks is the BEST time to do this so that you can pass the protection on to your baby. During pregnancy is better than after pregnancy, but if you are unable to get it during pregnancy it will be offered at the hospital.   You can get this vaccine at the health department or your family doctor  Everyone who will be around your baby should also be up-to-date on their vaccines. Adults (who are not pregnant) only need 1 dose of Tdap during adulthood.   Third Trimester of Pregnancy The third trimester is from week 29 through week 42, months 7 through 9. The third trimester is a time when the fetus is growing rapidly. At the end of the ninth month, the fetus is about 20 inches in length and weighs 6-10 pounds.  BODY CHANGES Your body goes through many changes during pregnancy. The changes vary from woman to woman.   Your weight will continue to increase. You can expect to gain 25-35 pounds (11-16 kg) by  the end of the pregnancy.  You may begin to get stretch marks on your hips, abdomen, and breasts.  You may urinate more often because the fetus is moving lower into your pelvis and pressing on your bladder.  You may develop or continue to have heartburn as a result of your pregnancy.  You may develop constipation because certain hormones are causing the muscles that push waste through your intestines to slow down.  You may develop hemorrhoids or swollen, bulging veins (varicose veins).  You may have pelvic pain because of the weight gain and pregnancy hormones relaxing your joints between the bones in your pelvis. Backaches may result from overexertion of the muscles supporting your posture.  You may have changes in your hair. These can include thickening of your hair, rapid growth, and changes in texture. Some women also have hair loss during or after pregnancy, or hair that feels dry or thin. Your hair will most likely return to normal after your baby is born.  Your breasts will continue to grow and be tender. A yellow discharge may leak from your breasts called colostrum.  Your belly button may stick out.  You may feel short of breath because of your expanding uterus.  You may notice the fetus "dropping," or moving lower in your abdomen.  You may have a bloody  mucus discharge. This usually occurs a few days to a week before labor begins.  Your cervix becomes thin and soft (effaced) near your due date. WHAT TO EXPECT AT YOUR PRENATAL EXAMS  You will have prenatal exams every 2 weeks until week 36. Then, you will have weekly prenatal exams. During a routine prenatal visit:  You will be weighed to make sure you and the fetus are growing normally.  Your blood pressure is taken.  Your abdomen will be measured to track your baby's growth.  The fetal heartbeat will be listened to.  Any test results from the previous visit will be discussed.  You may have a cervical check near your  due date to see if you have effaced. At around 36 weeks, your caregiver will check your cervix. At the same time, your caregiver will also perform a test on the secretions of the vaginal tissue. This test is to determine if a type of bacteria, Group B streptococcus, is present. Your caregiver will explain this further. Your caregiver may ask you:  What your birth plan is.  How you are feeling.  If you are feeling the baby move.  If you have had any abnormal symptoms, such as leaking fluid, bleeding, severe headaches, or abdominal cramping.  If you have any questions. Other tests or screenings that may be performed during your third trimester include:  Blood tests that check for low iron levels (anemia).  Fetal testing to check the health, activity level, and growth of the fetus. Testing is done if you have certain medical conditions or if there are problems during the pregnancy. FALSE LABOR You may feel small, irregular contractions that eventually go away. These are called Braxton Hicks contractions, or false labor. Contractions may last for hours, days, or even weeks before true labor sets in. If contractions come at regular intervals, intensify, or become painful, it is best to be seen by your caregiver.  SIGNS OF LABOR   Menstrual-like cramps.  Contractions that are 5 minutes apart or less.  Contractions that start on the top of the uterus and spread down to the lower abdomen and back.  A sense of increased pelvic pressure or back pain.  A watery or bloody mucus discharge that comes from the vagina. If you have any of these signs before the 37th week of pregnancy, call your caregiver right away. You need to go to the hospital to get checked immediately. HOME CARE INSTRUCTIONS   Avoid all smoking, herbs, alcohol, and unprescribed drugs. These chemicals affect the formation and growth of the baby.  Follow your caregiver's instructions regarding medicine use. There are medicines  that are either safe or unsafe to take during pregnancy.  Exercise only as directed by your caregiver. Experiencing uterine cramps is a good sign to stop exercising.  Continue to eat regular, healthy meals.  Wear a good support bra for breast tenderness.  Do not use hot tubs, steam rooms, or saunas.  Wear your seat belt at all times when driving.  Avoid raw meat, uncooked cheese, cat litter boxes, and soil used by cats. These carry germs that can cause birth defects in the baby.  Take your prenatal vitamins.  Try taking a stool softener (if your caregiver approves) if you develop constipation. Eat more high-fiber foods, such as fresh vegetables or fruit and whole grains. Drink plenty of fluids to keep your urine clear or pale yellow.  Take warm sitz baths to soothe any pain or discomfort caused by hemorrhoids.  Use hemorrhoid cream if your caregiver approves.  If you develop varicose veins, wear support hose. Elevate your feet for 15 minutes, 3-4 times a day. Limit salt in your diet.  Avoid heavy lifting, wear low heal shoes, and practice good posture.  Rest a lot with your legs elevated if you have leg cramps or low back pain.  Visit your dentist if you have not gone during your pregnancy. Use a soft toothbrush to brush your teeth and be gentle when you floss.  A sexual relationship may be continued unless your caregiver directs you otherwise.  Do not travel far distances unless it is absolutely necessary and only with the approval of your caregiver.  Take prenatal classes to understand, practice, and ask questions about the labor and delivery.  Make a trial run to the hospital.  Pack your hospital bag.  Prepare the baby's nursery.  Continue to go to all your prenatal visits as directed by your caregiver. SEEK MEDICAL CARE IF:  You are unsure if you are in labor or if your water has broken.  You have dizziness.  You have mild pelvic cramps, pelvic pressure, or nagging  pain in your abdominal area.  You have persistent nausea, vomiting, or diarrhea.  You have a bad smelling vaginal discharge.  You have pain with urination. SEEK IMMEDIATE MEDICAL CARE IF:   You have a fever.  You are leaking fluid from your vagina.  You have spotting or bleeding from your vagina.  You have severe abdominal cramping or pain.  You have rapid weight loss or gain.  You have shortness of breath with chest pain.  You notice sudden or extreme swelling of your face, hands, ankles, feet, or legs.  You have not felt your baby move in over an hour.  You have severe headaches that do not go away with medicine.  You have vision changes. Document Released: 05/24/2001 Document Revised: 06/04/2013 Document Reviewed: 07/31/2012 [employer] Patient Information 2015 [employer]. This information is not intended to replace advice given to you by your health care provider. Make sure you discuss any questions you have with your health care provider.   

## 2017-07-13 NOTE — Progress Notes (Signed)
   LOW-RISK PREGNANCY VISIT Patient name: Gloria Durhamshley W Skousen MRN [id number]  Date of birth: 15-May-1991 Chief Complaint:   Routine Prenatal Visit  History of Present Illness:   Gloria Lyons is a 27 y.o. G1P0 female at 422w3d with an Estimated Date of Delivery: 09/04/17 being seen today for ongoing management of a low-risk pregnancy.  Today she reports no complaints. Contractions: Not present. Vag. Bleeding: None.  Movement: Present. denies leaking of fluid. Review of Systems:   Pertinent items are noted in HPI Denies abnormal vaginal discharge w/ itching/odor/irritation, headaches, visual changes, shortness of breath, chest pain, abdominal pain, severe nausea/vomiting, or problems with urination or bowel movements unless otherwise stated above. Pertinent History Reviewed:  Reviewed past medical,surgical, social, obstetrical and family history.  Reviewed problem list, medications and allergies. Physical Assessment:   Vitals:   07/13/17 1602  BP: 122/78  Pulse: 80  Weight: 225 lb (102.1 kg)  Body mass index is 37.73 kg/m.        Physical Examination:   General appearance: Well appearing, and in no distress  Mental status: Alert, oriented to person, place, and time  Skin: Warm & dry  Cardiovascular: Normal heart rate noted  Respiratory: Normal respiratory effort, no distress  Abdomen: Soft, gravid, nontender  Pelvic: Cervical exam deferred         Extremities: Edema: Trace  Fetal Status: Fetal Heart Rate (bpm): 140 Fundal Height: 32 cm Movement: Present    Results for orders placed or performed in visit on 07/13/17 (from the past 24 hour(s))  POCT Urinalysis Dipstick   Collection Time: 07/13/17  4:06 PM  Result Value Ref Range   Color, UA     Clarity, UA     Glucose, UA neg    Bilirubin, UA     Ketones, UA neg    Spec Grav, UA  1.010 - 1.025   Blood, UA neg    pH, UA  5.0 - 8.0   Protein, UA neg    Urobilinogen, UA  0.2 or 1.0 E.U./dL   Nitrite, UA positive    Leukocytes,  UA Negative Negative   Appearance     Odor      Assessment & Plan:  1) Low-risk pregnancy G1P0 at 682w3d with an Estimated Date of Delivery: 09/04/17   2) Persitent +nitrites in urine, last cx was streptococcus mitis/oralis- not a urinary pathogen, pt is asymptomatic   Meds: No orders of the defined types were placed in this encounter.  Labs/procedures today: none  Plan:  Continue routine obstetrical care   Reviewed: Preterm labor symptoms and general obstetric precautions including but not limited to vaginal bleeding, contractions, leaking of fluid and fetal movement were reviewed in detail with the patient.  Recommended Tdap at HD/PCP per CDC guidelines. All questions were answered  Follow-up: Return in about 2 weeks (around 07/27/2017) for LROB.  Orders Placed This Encounter  Procedures  . POCT Urinalysis Dipstick   Marge DuncansBooker, Roney Youtz Randall CNM, [facility]WHNP-BC 07/13/2017 4:30 PM

## 2017-07-26 ENCOUNTER — Ambulatory Visit (INDEPENDENT_AMBULATORY_CARE_PROVIDER_SITE_OTHER): Payer: Federal, State, Local not specified - PPO | Admitting: Advanced Practice Midwife

## 2017-07-26 ENCOUNTER — Encounter: Payer: Self-pay | Admitting: Advanced Practice Midwife

## 2017-07-26 VITALS — BP 120/80 | HR 93 | Temp 98.6°F | Wt 228.4 lb

## 2017-07-26 DIAGNOSIS — Z331 Pregnant state, incidental: Secondary | ICD-10-CM

## 2017-07-26 DIAGNOSIS — Z3403 Encounter for supervision of normal first pregnancy, third trimester: Secondary | ICD-10-CM

## 2017-07-26 DIAGNOSIS — Z1389 Encounter for screening for other disorder: Secondary | ICD-10-CM

## 2017-07-26 DIAGNOSIS — Z3A34 34 weeks gestation of pregnancy: Secondary | ICD-10-CM

## 2017-07-26 LAB — POCT URINALYSIS DIPSTICK
GLUCOSE UA: NEGATIVE
Ketones, UA: NEGATIVE
LEUKOCYTES UA: NEGATIVE
RBC UA: NEGATIVE

## 2017-07-26 NOTE — Progress Notes (Signed)
G1P0 2422w2d Estimated Date of Delivery: 09/04/17  Blood pressure 120/80, pulse 93, temperature 98.6 F (37 C), weight 228 lb 6.4 oz (103.6 kg), last menstrual period 11/28/2016.   BP weight and urine results all reviewed and noted.  Please refer to the obstetrical flow sheet for the fundal height and fetal heart rate documentation:  Patient reports good fetal movement, denies any bleeding and no rupture of membranes symptoms or regular contractions. Patient is without complaints other than heartburn, can try tums All questions were answered.  Orders Placed This Encounter  Procedures  . POCT urinalysis dipstick    Plan:  Continued routine obstetrical care,   Return in about 2 weeks (around 08/09/2017) for LROB.

## 2017-07-26 NOTE — Patient Instructions (Signed)

## 2017-08-02 ENCOUNTER — Encounter: Payer: Self-pay | Admitting: Women's Health

## 2017-08-08 DIAGNOSIS — Z23 Encounter for immunization: Secondary | ICD-10-CM | POA: Diagnosis not present

## 2017-08-09 ENCOUNTER — Encounter: Payer: Federal, State, Local not specified - PPO | Admitting: Obstetrics and Gynecology

## 2017-08-10 ENCOUNTER — Other Ambulatory Visit: Payer: Self-pay

## 2017-08-10 ENCOUNTER — Encounter: Payer: Self-pay | Admitting: Women's Health

## 2017-08-10 ENCOUNTER — Ambulatory Visit (INDEPENDENT_AMBULATORY_CARE_PROVIDER_SITE_OTHER): Payer: Federal, State, Local not specified - PPO | Admitting: Women's Health

## 2017-08-10 VITALS — BP 134/80 | HR 90 | Wt 234.0 lb

## 2017-08-10 DIAGNOSIS — E039 Hypothyroidism, unspecified: Secondary | ICD-10-CM

## 2017-08-10 DIAGNOSIS — Z3403 Encounter for supervision of normal first pregnancy, third trimester: Secondary | ICD-10-CM

## 2017-08-10 DIAGNOSIS — Z1389 Encounter for screening for other disorder: Secondary | ICD-10-CM

## 2017-08-10 DIAGNOSIS — Z331 Pregnant state, incidental: Secondary | ICD-10-CM

## 2017-08-10 DIAGNOSIS — O99283 Endocrine, nutritional and metabolic diseases complicating pregnancy, third trimester: Secondary | ICD-10-CM

## 2017-08-10 DIAGNOSIS — Z3A36 36 weeks gestation of pregnancy: Secondary | ICD-10-CM

## 2017-08-10 LAB — POCT URINALYSIS DIPSTICK
Blood, UA: NEGATIVE
GLUCOSE UA: NEGATIVE
Ketones, UA: NEGATIVE
LEUKOCYTES UA: NEGATIVE
NITRITE UA: POSITIVE
PROTEIN UA: NEGATIVE

## 2017-08-10 NOTE — Patient Instructions (Signed)
Gloria DurhamAshley [name], I greatly value your feedback.  If you receive a survey following your visit with us today, we appreciate you taking the time to fill it out.  Thanks, Joellyn HaffKim Kinney Sackmann, CNM, WHNP-BC   Call the office [phone]) or go to [facility] if:  You begin to have strong, frequent contractions  Your water breaks.  Sometimes it is a big gush of fluid, sometimes it is just a trickle that keeps getting your panties wet or running down your legs  You have vaginal bleeding.  It is normal to have a small amount of spotting if your cervix was checked.   You don't feel your baby moving like normal.  If you don't, get you something to eat and drink and lay down and focus on feeling your baby move.  You should feel at least 10 movements in 2 hours.  If you don't, you should call the office or go to [facility].     Preterm Labor and Birth Information The normal length of a pregnancy is 39-41 weeks. Preterm labor is when labor starts before 37 completed weeks of pregnancy. What are the risk factors for preterm labor? Preterm labor is more likely to occur in women who:  Have certain infections during pregnancy such as a bladder infection, sexually transmitted infection, or infection inside the uterus (chorioamnionitis).  Have a shorter-than-normal cervix.  Have gone into preterm labor before.  Have had surgery on their cervix.  Are younger than age 28 or older than age 28.  Are African American.  Are pregnant with twins or multiple babies (multiple gestation).  Take street drugs or smoke while pregnant.  Do not gain enough weight while pregnant.  Became pregnant shortly after having been pregnant.  What are the symptoms of preterm labor? Symptoms of preterm labor include:  Cramps similar to those that can happen during a menstrual period. The cramps may happen with diarrhea.  Pain in the abdomen or lower back.  Regular uterine contractions that may feel like tightening  of the abdomen.  A feeling of increased pressure in the pelvis.  Increased watery or bloody mucus discharge from the vagina.  Water breaking (ruptured amniotic sac).  Why is it important to recognize signs of preterm labor? It is important to recognize signs of preterm labor because babies who are born prematurely may not be fully developed. This can put them at an increased risk for:  Long-term (chronic) heart and lung problems.  Difficulty immediately after birth with regulating body systems, including blood sugar, body temperature, heart rate, and breathing rate.  Bleeding in the brain.  Cerebral palsy.  Learning difficulties.  Death.  These risks are highest for babies who are born before 34 weeks of pregnancy. How is preterm labor treated? Treatment depends on the length of your pregnancy, your condition, and the health of your baby. It may involve:  Having a stitch (suture) placed in your cervix to prevent your cervix from opening too early (cerclage).  Taking or being given medicines, such as: ? Hormone medicines. These may be given early in pregnancy to help support the pregnancy. ? Medicine to stop contractions. ? Medicines to help mature the baby's lungs. These may be prescribed if the risk of delivery is high. ? Medicines to prevent your baby from developing cerebral palsy.  If the labor happens before 34 weeks of pregnancy, you may need to stay in the hospital. What should I do if I think I am in preterm labor? If  you think that you are going into preterm labor, call your health care provider right away. How can I prevent preterm labor in future pregnancies? To increase your chance of having a full-term pregnancy:  Do not use any tobacco products, such as cigarettes, chewing tobacco, and e-cigarettes. If you need help quitting, ask your health care provider.  Do not use street drugs or medicines that have not been prescribed to you during your pregnancy.  Talk  with your health care provider before taking any herbal supplements, even if you have been taking them regularly.  Make sure you gain a healthy amount of weight during your pregnancy.  Watch for infection. If you think that you might have an infection, get it checked right away.  Make sure to tell your health care provider if you have gone into preterm labor before.  This information is not intended to replace advice given to you by your health care provider. Make sure you discuss any questions you have with your health care provider. Document Released: 08/20/2003 Document Revised: 11/10/2015 Document Reviewed: 10/21/2015 Elsevier Interactive Patient Education  2018 [employer].

## 2017-08-10 NOTE — Progress Notes (Signed)
   LOW-RISK PREGNANCY VISIT Patient name: Gloria Durhamshley W Nasser MRN [id number]  Date of birth: 14-Oct-1990 Chief Complaint:   Routine Prenatal Visit  History of Present Illness:   Gloria Lyons is a 27 y.o. G1P0 female at 5319w3d with an Estimated Date of Delivery: 09/04/17 being seen today for ongoing management of a low-risk pregnancy.  Today she reports no complaints. Contractions: Not present. Vag. Bleeding: None.  Movement: Present. denies leaking of fluid. Review of Systems:   Pertinent items are noted in HPI Denies abnormal vaginal discharge w/ itching/odor/irritation, headaches, visual changes, shortness of breath, chest pain, abdominal pain, severe nausea/vomiting, or problems with urination or bowel movements unless otherwise stated above. Pertinent History Reviewed:  Reviewed past medical,surgical, social, obstetrical and family history.  Reviewed problem list, medications and allergies. Physical Assessment:   Vitals:   08/10/17 1629  BP: 134/80  Pulse: 90  Weight: 234 lb (106.1 kg)  Body mass index is 39.24 kg/m.        Physical Examination:   General appearance: Well appearing, and in no distress  Mental status: Alert, oriented to person, place, and time  Skin: Warm & dry  Cardiovascular: Normal heart rate noted  Respiratory: Normal respiratory effort, no distress  Abdomen: Soft, gravid, nontender  Pelvic: Cervical exam deferred         Extremities: Edema: Trace  Fetal Status: Fetal Heart Rate (bpm): 130 Fundal Height: 37 cm Movement: Present    Results for orders placed or performed in visit on 08/10/17 (from the past 24 hour(s))  POCT urinalysis dipstick   Collection Time: 08/10/17  4:31 PM  Result Value Ref Range   Color, UA     Clarity, UA     Glucose, UA neg    Bilirubin, UA     Ketones, UA neg    Spec Grav, UA  1.010 - 1.025   Blood, UA neg    pH, UA  5.0 - 8.0   Protein, UA neg    Urobilinogen, UA  0.2 or 1.0 E.U./dL   Nitrite, UA positive    Leukocytes,  UA Negative Negative   Appearance     Odor      Assessment & Plan:  1) Low-risk pregnancy G1P0 at 5619w3d with an Estimated Date of Delivery: 09/04/17   2) Hypothyroid, not on meds, lab closing now, will get 3rd trimester TSH next visit   Meds: No orders of the defined types were placed in this encounter.  Labs/procedures today: none  Plan:  Continue routine obstetrical care   Reviewed: Preterm labor symptoms and general obstetric precautions including but not limited to vaginal bleeding, contractions, leaking of fluid and fetal movement were reviewed in detail with the patient.  All questions were answered  Follow-up: Return in about 1 week (around 08/17/2017) for LROB., gbs, TSH  Orders Placed This Encounter  Procedures  . POCT urinalysis dipstick   Cheral MarkerKimberly R Aniela Caniglia CNM, [facility] 08/10/2017 5:00 PM

## 2017-08-17 ENCOUNTER — Other Ambulatory Visit: Payer: Federal, State, Local not specified - PPO

## 2017-08-17 ENCOUNTER — Ambulatory Visit (INDEPENDENT_AMBULATORY_CARE_PROVIDER_SITE_OTHER): Payer: Federal, State, Local not specified - PPO | Admitting: Advanced Practice Midwife

## 2017-08-17 VITALS — BP 132/76 | HR 88 | Wt 239.0 lb

## 2017-08-17 DIAGNOSIS — E039 Hypothyroidism, unspecified: Secondary | ICD-10-CM

## 2017-08-17 DIAGNOSIS — Z3A37 37 weeks gestation of pregnancy: Secondary | ICD-10-CM | POA: Diagnosis not present

## 2017-08-17 DIAGNOSIS — Z3403 Encounter for supervision of normal first pregnancy, third trimester: Secondary | ICD-10-CM

## 2017-08-17 NOTE — Progress Notes (Signed)
error 

## 2017-08-17 NOTE — Progress Notes (Signed)
  G1P0 7814w3d Estimated Date of Delivery: 09/04/17  Blood pressure 132/76, pulse 88, weight 239 lb (108.4 kg), last menstrual period 11/28/2016.   BP weight and urine results all reviewed and noted.  Please refer to the obstetrical flow sheet for the fundal height and fetal heart rate documentation:  Patient reports good fetal movement, denies any bleeding and no rupture of membranes symptoms or regular contractions. Patient is without complaints. All questions were answered.   Physical Assessment:   Vitals:   08/17/17 1552  BP: 132/76  Pulse: 88  Weight: 239 lb (108.4 kg)  Body mass index is 40.08 kg/m.        Physical Examination:   General appearance: Well appearing, and in no distress  Mental status: Alert, oriented to person, place, and time  Skin: Warm & dry  Cardiovascular: Normal heart rate noted  Respiratory: Normal respiratory effort, no distress  Abdomen: Soft, gravid, nontender  Pelvic: Cervical exam performed         Extremities: Edema: Trace  Fetal Status:     Movement: Present    No results found for this or any previous visit (from the past 24 hour(s)).   Orders Placed This Encounter  Procedures  . GC/Chlamydia Probe Amp([facility])  . Culture, beta strep (group b only)    Plan:  Continued routine obstetrical care, note given to decrease work hours to 25-30/week.    Return in about 1 week (around 08/24/2017) for LROB.

## 2017-08-17 NOTE — Patient Instructions (Signed)

## 2017-08-18 LAB — TSH: TSH: 0.886 u[IU]/mL (ref 0.450–4.500)

## 2017-08-20 LAB — GC/CHLAMYDIA PROBE AMP
CHLAMYDIA, DNA PROBE: NEGATIVE
NEISSERIA GONORRHOEAE BY PCR: NEGATIVE

## 2017-08-21 LAB — OB RESULTS CONSOLE GBS: GBS: NEGATIVE

## 2017-08-21 LAB — CULTURE, BETA STREP (GROUP B ONLY): Strep Gp B Culture: NEGATIVE

## 2017-08-24 ENCOUNTER — Other Ambulatory Visit: Payer: Self-pay

## 2017-08-24 ENCOUNTER — Encounter: Payer: Self-pay | Admitting: Women's Health

## 2017-08-24 ENCOUNTER — Encounter (HOSPITAL_COMMUNITY): Payer: Self-pay | Admitting: *Deleted

## 2017-08-24 ENCOUNTER — Ambulatory Visit (INDEPENDENT_AMBULATORY_CARE_PROVIDER_SITE_OTHER): Payer: Federal, State, Local not specified - PPO | Admitting: Women's Health

## 2017-08-24 ENCOUNTER — Inpatient Hospital Stay (HOSPITAL_COMMUNITY)
Admission: AD | Admit: 2017-08-24 | Discharge: 2017-08-27 | DRG: 807 | Disposition: A | Discharge status: Home / Self Care | Payer: Federal, State, Local not specified - PPO | Source: Ambulatory Visit | Attending: Family Medicine | Admitting: Family Medicine

## 2017-08-24 VITALS — BP 160/100 | HR 98 | Wt 237.0 lb

## 2017-08-24 DIAGNOSIS — Z3400 Encounter for supervision of normal first pregnancy, unspecified trimester: Secondary | ICD-10-CM

## 2017-08-24 DIAGNOSIS — O134 Gestational [pregnancy-induced] hypertension without significant proteinuria, complicating childbirth: Principal | ICD-10-CM | POA: Diagnosis present

## 2017-08-24 DIAGNOSIS — O2343 Unspecified infection of urinary tract in pregnancy, third trimester: Secondary | ICD-10-CM | POA: Diagnosis not present

## 2017-08-24 DIAGNOSIS — Z7722 Contact with and (suspected) exposure to environmental tobacco smoke (acute) (chronic): Secondary | ICD-10-CM | POA: Diagnosis present

## 2017-08-24 DIAGNOSIS — Z3A38 38 weeks gestation of pregnancy: Secondary | ICD-10-CM | POA: Diagnosis not present

## 2017-08-24 DIAGNOSIS — Z3403 Encounter for supervision of normal first pregnancy, third trimester: Secondary | ICD-10-CM

## 2017-08-24 DIAGNOSIS — O99214 Obesity complicating childbirth: Secondary | ICD-10-CM | POA: Diagnosis not present

## 2017-08-24 DIAGNOSIS — R82998 Other abnormal findings in urine: Secondary | ICD-10-CM

## 2017-08-24 DIAGNOSIS — O1493 Unspecified pre-eclampsia, third trimester: Secondary | ICD-10-CM | POA: Diagnosis not present

## 2017-08-24 DIAGNOSIS — O09299 Supervision of pregnancy with other poor reproductive or obstetric history, unspecified trimester: Secondary | ICD-10-CM

## 2017-08-24 DIAGNOSIS — Z331 Pregnant state, incidental: Secondary | ICD-10-CM

## 2017-08-24 DIAGNOSIS — Z1389 Encounter for screening for other disorder: Secondary | ICD-10-CM

## 2017-08-24 DIAGNOSIS — Z23 Encounter for immunization: Secondary | ICD-10-CM | POA: Diagnosis not present

## 2017-08-24 HISTORY — DX: Supervision of pregnancy with other poor reproductive or obstetric history, unspecified trimester: O09.299

## 2017-08-24 LAB — COMPREHENSIVE METABOLIC PANEL
ALK PHOS: 171 U/L — AB (ref 38–126)
ALT: 12 U/L — ABNORMAL LOW (ref 14–54)
AST: 19 U/L (ref 15–41)
Albumin: 2.7 g/dL — ABNORMAL LOW (ref 3.5–5.0)
Anion gap: 10 (ref 5–15)
BILIRUBIN TOTAL: 0.6 mg/dL (ref 0.3–1.2)
BUN: <5 mg/dL — ABNORMAL LOW (ref 6–20)
CALCIUM: 8.4 mg/dL — AB (ref 8.9–10.3)
CO2: 21 mmol/L — ABNORMAL LOW (ref 22–32)
CREATININE: 0.57 mg/dL (ref 0.44–1.00)
Chloride: 105 mmol/L (ref 101–111)
GFR calc non Af Amer: >60 mL/min (ref >60)
Glucose, Bld: 123 mg/dL — ABNORMAL HIGH (ref 65–99)
Potassium: 3.1 mmol/L — ABNORMAL LOW (ref 3.5–5.1)
Sodium: 136 mmol/L (ref 135–145)
TOTAL PROTEIN: 6.2 g/dL — AB (ref 6.5–8.1)

## 2017-08-24 LAB — TYPE AND SCREEN
ABO/RH(D): B POS
Antibody Screen: NEGATIVE

## 2017-08-24 LAB — POCT URINALYSIS DIPSTICK
Glucose, UA: NEGATIVE
KETONES UA: NEGATIVE
NITRITE UA: POSITIVE

## 2017-08-24 LAB — CBC
HCT: 30.7 % — ABNORMAL LOW (ref 36.0–46.0)
HEMOGLOBIN: 9.9 g/dL — AB (ref 12.0–15.0)
MCH: 26 pg (ref 26.0–34.0)
MCHC: 32.2 g/dL (ref 30.0–36.0)
MCV: 80.6 fL (ref 78.0–100.0)
Platelets: 165 10*3/uL (ref 150–400)
RBC: 3.81 MIL/uL — ABNORMAL LOW (ref 3.87–5.11)
RDW: 13.5 % (ref 11.5–15.5)
WBC: 9.8 10*3/uL (ref 4.0–10.5)

## 2017-08-24 LAB — PROTEIN / CREATININE RATIO, URINE
CREATININE, URINE: 300 mg/dL
Protein Creatinine Ratio: 0.15 mg/mg{Cre} (ref 0.00–0.15)
TOTAL PROTEIN, URINE: 45 mg/dL

## 2017-08-24 LAB — ABO/RH: ABO/RH(D): B POS

## 2017-08-24 MED ORDER — POTASSIUM CHLORIDE CRYS ER 20 MEQ PO TBCR
40.0000 meq | EXTENDED_RELEASE_TABLET | Freq: Once | ORAL | Status: AC
  Administered 2017-08-24: 40 meq via ORAL
  Filled 2017-08-24: qty 2

## 2017-08-24 MED ORDER — TERBUTALINE SULFATE 1 MG/ML IJ SOLN
0.2500 mg | Freq: Once | INTRAMUSCULAR | Status: DC | PRN
  Filled 2017-08-24: qty 1

## 2017-08-24 MED ORDER — OXYTOCIN BOLUS FROM INFUSION: 500.0000 mL | Freq: Once | INTRAVENOUS | Status: DC

## 2017-08-24 MED ORDER — OXYCODONE-ACETAMINOPHEN 5-325 MG PO TABS: 2.0000 | ORAL_TABLET | ORAL | Status: DC | PRN

## 2017-08-24 MED ORDER — FENTANYL CITRATE (PF) 100 MCG/2ML IJ SOLN
100.0000 ug | INTRAMUSCULAR | Status: DC | PRN
  Administered 2017-08-25 (×2): 100 ug via INTRAVENOUS
  Filled 2017-08-24 (×2): qty 2

## 2017-08-24 MED ORDER — LACTATED RINGERS IV SOLN: 500.0000 mL | INTRAVENOUS | Status: DC | PRN

## 2017-08-24 MED ORDER — OXYCODONE-ACETAMINOPHEN 5-325 MG PO TABS: 1.0000 | ORAL_TABLET | ORAL | Status: DC | PRN

## 2017-08-24 MED ORDER — SOD CITRATE-CITRIC ACID 500-334 MG/5ML PO SOLN: 30.0000 mL | ORAL | Status: DC | PRN

## 2017-08-24 MED ORDER — OXYTOCIN 40 UNITS IN LACTATED RINGERS INFUSION - SIMPLE MED
2.5000 [IU]/h | INTRAVENOUS | Status: DC
  Administered 2017-08-25: 2.5 [IU]/h via INTRAVENOUS

## 2017-08-24 MED ORDER — OXYTOCIN 40 UNITS IN LACTATED RINGERS INFUSION - SIMPLE MED
1.0000 m[IU]/min | INTRAVENOUS | Status: DC
  Administered 2017-08-24: 2 m[IU]/min via INTRAVENOUS
  Filled 2017-08-24: qty 1000

## 2017-08-24 MED ORDER — ACETAMINOPHEN 325 MG PO TABS: 650.0000 mg | ORAL_TABLET | ORAL | Status: DC | PRN

## 2017-08-24 MED ORDER — LACTATED RINGERS IV SOLN
INTRAVENOUS | Status: DC
  Administered 2017-08-24: 125 mL/h via INTRAVENOUS

## 2017-08-24 MED ORDER — LIDOCAINE HCL (PF) 1 % IJ SOLN
30.0000 mL | INTRAMUSCULAR | Status: AC | PRN
  Administered 2017-08-25: 30 mL via SUBCUTANEOUS
  Filled 2017-08-24: qty 30

## 2017-08-24 MED ORDER — FLEET ENEMA 7-19 GM/118ML RE ENEM: 1.0000 | ENEMA | RECTAL | Status: DC | PRN

## 2017-08-24 MED ORDER — ONDANSETRON HCL 4 MG/2ML IJ SOLN: 4.0000 mg | Freq: Four times a day (QID) | INTRAMUSCULAR | Status: DC | PRN

## 2017-08-24 NOTE — H&P (Signed)
LABOR AND DELIVERY ADMISSION HISTORY AND PHYSICAL NOTE  Gloria Lyons is a 27 y.o. female G1P0 with IUP at 7835w3d by LMP + 9wk US presenting for IOL for new-onset pregnancy-induced hypertension. She has no history of HTN, and no prior elevated blood pressures, but today during routine PNV, BP was 160/100. She denies headache, blurry vision/visual disturbances, RUQ/epigastric pain, or SOB. She endorses increased swelling on BLE in the last couple of weeks, and has also noted some bilateral hand swelling.  She reports positive fetal movement. She denies regular contractions, leakage of fluid or vaginal bleeding.  Pren[facility] History/Complications[facility] Pregnancy complications:  - Hx of hypothyroidism: not on meds, and TSH has been normal during pregnancy  Past Medical History: Past Medical History:  Diagnosis Date  . Hypothyroidism     Past Surgical History: History reviewed. No pertinent surgical history.  Obstetrical History: OB History    Gravida Para Term Preterm AB Living   1             SAB TAB Ectopic Multiple Live Births                  Social History: Social History   Socioeconomic History  . Marital status: Single    Spouse name: None  . Number of children: None  . Years of education: None  . Highest education level: None  Social Needs  . Financial resource strain: None  . Food insecurity - worry: None  . Food insecurity - inability: None  . Transportation needs - medical: None  . Transportation needs - non-medical: None  Occupational History  . None  Tobacco Use  . Smoking status: Passive Smoke Exposure - Never Smoker  . Smokeless tobacco: Never Used  Substance and Sexual Activity  . Alcohol use: No    Comment: not now  . Drug use: No  . Sexual activity: Yes    Birth control/protection: None  Other Topics Concern  . None  Social History Narrative  . None    Family History: Family History  Problem Relation Age of Onset  . Cancer  Paternal Grandfather   . Diabetes Paternal Grandfather   . Cancer Maternal Grandmother   . Diabetes Maternal Grandmother   . Diabetes Maternal Grandfather   . Leukemia Father   . Hypertension Father   . Heart attack Mother   . Seizures Mother   . Hypertension Mother     Allergies: No Known Allergies  Medications Prior to Admission  Medication Sig Dispense Refill Last Dose  . Prenatal MV-Min-FA-Omega-3 (PRENATAL GUMMIES/DHA & FA) 0.4-32.5 MG CHEW Chew 2 capsules by mouth daily.   Taking     Review of Systems  All systems reviewed and negative except as stated in HPI  Physical Exam Blood pressure (!) 158/93, pulse 92, temperature 99 F (37.2 C), temperature source Oral, resp. rate 17, height 5\' 5"  (1.651 m), weight 237 lb 12 oz (107.8 kg), last menstrual period 11/28/2016. General appearance: alert, oriented, NAD HEENT: [address]/AT, no scleral icterus, normal oral mucosa Lungs: normal respiratory effort, lungs CTAB w/o wheezes, rales or ronchi Heart: regular rate, normal rhythm, no murmurs Abdomen: soft, non-tender; gravid, FH appropriate for GA Extremities: BLE edema, 1-2+ Neuro: patellar DTRs 2+ bilaterally Presentation: cephalic Fetal monitoring: baseline rate 145, moderate varibility, +acel, no decel Uterine activity: no ctx tracing    Prenatal labs: ABO, Rh: B/Positive/-- (09/11 1529) Antibody: Negative (12/27 0906) Rubella: 4.99 (09/11 1529) RPR: Non Reactive (12/27 0906)  HBsAg:  Negative (09/11 1529)  HIV: Non Reactive (12/27 0906)  GC/Chlamydia: negative GBS:   negative 2-hr GTT: normal Genetic screening:  declined Anatomy US: normal  Prenatal Transfer Tool  Maternal Diabetes: No Genetic Screening: Declined Maternal Ultrasounds/Referrals: Normal Fetal Ultrasounds or other Referrals:  None Maternal Substance Abuse:  No Significant Maternal Medications:  None Significant Maternal Lab Results: Lab values include: Group B Strep negative  Results for orders  placed or performed during the hospital encounter of 08/24/17 (from the past 24 hour(s))  CBC   Collection Time: 08/24/17  6:16 PM  Result Value Ref Range   WBC 9.8 4.0 - 10.5 K/uL   RBC 3.81 (L) 3.87 - 5.11 MIL/uL   Hemoglobin 9.9 (L) 12.0 - 15.0 g/dL   HCT 04.5 (L) 40.9 - 81.1 %   MCV 80.6 78.0 - 100.0 fL   MCH 26.0 26.0 - 34.0 pg   MCHC 32.2 30.0 - 36.0 g/dL   RDW 91.4 78.2 - 95.6 %   Platelets 165 150 - 400 K/uL  Comprehensive metabolic panel   Collection Time: 08/24/17  6:16 PM  Result Value Ref Range   Sodium 136 135 - 145 mmol/L   Potassium 3.1 (L) 3.5 - 5.1 mmol/L   Chloride 105 101 - 111 mmol/L   CO2 21 (L) 22 - 32 mmol/L   Glucose, Bld 123 (H) 65 - 99 mg/dL   BUN PENDING 6 - 20 mg/dL   Creatinine, Ser 2.13 0.44 - 1.00 mg/dL   Calcium 8.4 (L) 8.9 - 10.3 mg/dL   Total Protein 6.2 (L) 6.5 - 8.1 g/dL   Albumin 2.7 (L) 3.5 - 5.0 g/dL   AST 19 15 - 41 U/L   ALT 12 (L) 14 - 54 U/L   Alkaline Phosphatase 171 (H) 38 - 126 U/L   Total Bilirubin 0.6 0.3 - 1.2 mg/dL   GFR calc non Af Amer >60 >60 mL/min   GFR calc Af Amer >60 >60 mL/min   Anion gap 10 5 - 15  Results for orders placed or performed in visit on 08/24/17 (from the past 24 hour(s))  POCT urinalysis dipstick   Collection Time: 08/24/17  4:03 PM  Result Value Ref Range   Color, UA     Clarity, UA     Glucose, UA neg    Bilirubin, UA     Ketones, UA neg    Spec Grav, UA  1.010 - 1.025   Blood, UA 2+    pH, UA  5.0 - 8.0   Protein, UA 1+    Urobilinogen, UA  0.2 or 1.0 E.U./dL   Nitrite, UA positive    Leukocytes, UA Moderate (2+) (A) Negative   Appearance     Odor      Assessment: Gloria Lyons is a 27 y.o. G1P0 at [redacted]w[redacted]d here for new-onset PIH  #PIH: CBC, CMP wnl, UPC pending. No signs/sx of severe features.   Admit to L&D for IOL  Monitor BP. Start IV Mag if severe range BPs or symptoms of severe pre-eclampsia  #Labor: SVE in office 3/50/-2. Start IV Pitocin #Pain: Per patient's request, may have  epidural #FWB: Cat I #ID:  GBS negative #MOF: breast #MOC: POPs #Circ:  N/a (girl)  Kandra Nicolas Gloria Lyons 08/24/2017, 6:49 PM

## 2017-08-24 NOTE — MAU Note (Signed)
[facility] EFM applied - FHR 130s, toco applied - abd. Soft.  Pt. Denies sudden gush of fluid, no vaginal bleeding noted.

## 2017-08-24 NOTE — Progress Notes (Signed)
   LOW-RISK PREGNANCY VISIT Patient name: Gloria Lyons MRN [id number]  Date of birth: 1991/06/13 Chief Complaint:   Routine Prenatal Visit (? contractions off and on)  History of Present Illness:   Gloria Lyons is a 27 y.o. G1P0 female at 4537w3d with an Estimated Date of Delivery: 09/04/17 being seen today for ongoing management of a low-risk pregnancy.  Today she reports no complaints. Has been stressed this week with car breaking down, mom started having hallucinations. Denies ha, ruq/epigastric pain, n/v. Just saw stars while rubbing eyes laying on exam table.  Contractions: Irregular. Vag. Bleeding: None.  Movement: Present. denies leaking of fluid. Review of Systems:   Pertinent items are noted in HPI Denies abnormal vaginal discharge w/ itching/odor/irritation, headaches, visual changes, shortness of breath, chest pain, abdominal pain, severe nausea/vomiting, or problems with urination or bowel movements unless otherwise stated above. Pertinent History Reviewed:  Reviewed past medical,surgical, social, obstetrical and family history.  Reviewed problem list, medications and allergies. Physical Assessment:   Vitals:   08/24/17 1602  BP: (!) 160/100  Pulse: 98  Weight: 237 lb (107.5 kg)  Body mass index is 39.74 kg/m.        Physical Examination:   General appearance: Well appearing, and in no distress  Mental status: Alert, oriented to person, place, and time  Skin: Warm & dry  Cardiovascular: Normal heart rate noted  Respiratory: Normal respiratory effort, no distress  Abdomen: Soft, gravid, nontender  Pelvic: Cervical exam performed  Dilation: 3 (tight) Effacement (%): 50 Station: -2  Extremities: Edema: Moderate pitting, indentation subsides rapidly, 2-3+ edema, 2-3+ DTRs, no clonus  Fetal Status: Fetal Heart Rate (bpm): 142 Fundal Height: 38 cm Movement: Present Presentation: Vertex  Results for orders placed or performed in visit on 08/24/17 (from the past 24 hour(s))   POCT urinalysis dipstick   Collection Time: 08/24/17  4:03 PM  Result Value Ref Range   Color, UA     Clarity, UA     Glucose, UA neg    Bilirubin, UA     Ketones, UA neg    Spec Grav, UA  1.010 - 1.025   Blood, UA 2+    pH, UA  5.0 - 8.0   Protein, UA 1+    Urobilinogen, UA  0.2 or 1.0 E.U./dL   Nitrite, UA positive    Leukocytes, UA Moderate (2+) (A) Negative   Appearance     Odor      Assessment & Plan:  1) Low-risk pregnancy G1P0 at 5337w3d with an Estimated Date of Delivery: 09/04/17   2) Elevated bp w/ proteinuria, new finding, send to MAU for pre-e eval  3) +Nitrites in urine> always has, urine cx always neg or strep mitis oralis   Meds: No orders of the defined types were placed in this encounter.  Labs/procedures today: sve  Plan:  To MAU  Reviewed: Term labor symptoms and general obstetric precautions including but not limited to vaginal bleeding, contractions, leaking of fluid and fetal movement were reviewed in detail with the patient.  All questions were answered  Follow-up: Return for Monday for lrob. if d/c'd from hospital  Orders Placed This Encounter  Procedures  . POCT urinalysis dipstick   Cheral MarkerKimberly R  CNM, [facility]BC 08/24/2017 4:38 PM

## 2017-08-24 NOTE — Patient Instructions (Signed)
Dale DurhamAshley W Baxendale, I greatly value your feedback.  If you receive a survey following your visit with us today, we appreciate you taking the time to fill it out.  Thanks, Joellyn HaffKim Savi Lastinger, CNM, WHNP-BC   Call the office [phone]) or go to [facility] if:  You begin to have strong, frequent contractions  Your water breaks.  Sometimes it is a big gush of fluid, sometimes it is just a trickle that keeps getting your panties wet or running down your legs  You have vaginal bleeding.  It is normal to have a small amount of spotting if your cervix was checked.   You don't feel your baby moving like normal.  If you don't, get you something to eat and drink and lay down and focus on feeling your baby move.  You should feel at least 10 movements in 2 hours.  If you don't, you should call the office or go to [facility].    Call the office [phone]) or go to [facility] for these signs of pre-eclampsia:  Severe headache that does not go away with Tylenol  Visual changes- seeing spots, double, blurred vision  Pain under your right breast or upper abdomen that does not go away with Tums or heartburn medicine  Nausea and/or vomiting  Severe swelling in your hands, feet, and face     Preeclampsia and Eclampsia Preeclampsia is a serious condition that develops only during pregnancy. It is also called toxemia of pregnancy. This condition causes high blood pressure along with other symptoms, such as swelling and headaches. These symptoms may develop as the condition gets worse. Preeclampsia may occur at 20 weeks of pregnancy or later. Diagnosing and treating preeclampsia early is very important. If not treated early, it can cause serious problems for you and your baby. One problem it can lead to is eclampsia, which is a condition that causes muscle jerking or shaking (convulsions or seizures) in the mother. Delivering your baby is the best treatment for preeclampsia or eclampsia. Preeclampsia and  eclampsia symptoms usually go away after your baby is born. What are the causes? The cause of preeclampsia is not known. What increases the risk? The following risk factors make you more likely to develop preeclampsia:  Being pregnant for the first time.  Having had preeclampsia during a past pregnancy.  Having a family history of preeclampsia.  Having high blood pressure.  Being pregnant with twins or triplets.  Being 2035 or older.  Being African-American.  Having kidney disease or diabetes.  Having medical conditions such as lupus or blood diseases.  Being very overweight (obese).  What are the signs or symptoms? The earliest signs of preeclampsia are:  High blood pressure.  Increased protein in your urine. Your health care provider will check for this at every visit before you give birth (prenatal visit).  Other symptoms that may develop as the condition gets worse include:  Severe headaches.  Sudden weight gain.  Swelling of the hands, face, legs, and feet.  Nausea and vomiting.  Vision problems, such as blurred or double vision.  Numbness in the face, arms, legs, and feet.  Urinating less than usual.  Dizziness.  Slurred speech.  Abdominal pain, especially upper abdominal pain.  Convulsions or seizures.  Symptoms generally go away after giving birth. How is this diagnosed? There are no screening tests for preeclampsia. Your health care provider will ask you about symptoms and check for signs of preeclampsia during your prenatal visits. You may also have  tests that include:  Urine tests.  Blood tests.  Checking your blood pressure.  Monitoring your baby's heart rate.  Ultrasound.  How is this treated? You and your health care provider will determine the treatment approach that is best for you. Treatment may include:  Having more frequent prenatal exams to check for signs of preeclampsia, if you have an increased risk for  preeclampsia.  Bed rest.  Reducing how much salt (sodium) you eat.  Medicine to lower your blood pressure.  Staying in the hospital, if your condition is severe. There, treatment will focus on controlling your blood pressure and the amount of fluids in your body (fluid retention).  You may need to take medicine (magnesium sulfate) to prevent seizures. This medicine may be given as an injection or through an IV tube.  Delivering your baby early, if your condition gets worse. You may have your labor started with medicine (induced), or you may have a cesarean delivery.  Follow these instructions at home: Eating and drinking   Drink enough fluid to keep your urine clear or pale yellow.  Eat a healthy diet that is low in sodium. Do not add salt to your food. Check nutrition labels to see how much sodium a food or beverage contains.  Avoid caffeine. Lifestyle  Do not use any products that contain nicotine or tobacco, such as cigarettes and e-cigarettes. If you need help quitting, ask your health care provider.  Do not use alcohol or drugs.  Avoid stress as much as possible. Rest and get plenty of sleep. General instructions  Take over-the-counter and prescription medicines only as told by your health care provider.  When lying down, lie on your side. This keeps pressure off of your baby.  When sitting or lying down, raise (elevate) your feet. Try putting some pillows underneath your lower legs.  Exercise regularly. Ask your health care provider what kinds of exercise are best for you.  Keep all follow-up and prenatal visits as told by your health care provider. This is important. How is this prevented? To prevent preeclampsia or eclampsia from developing during another pregnancy:  Get proper medical care during pregnancy. Your health care provider may be able to prevent preeclampsia or diagnose and treat it early.  Your health care provider may have you take a low-dose aspirin  or a calcium supplement during your next pregnancy.  You may have tests of your blood pressure and kidney function after giving birth.  Maintain a healthy weight. Ask your health care provider for help managing weight gain during pregnancy.  Work with your health care provider to manage any long-term (chronic) health conditions you have, such as diabetes or kidney problems.  Contact a health care provider if:  You gain more weight than expected.  You have headaches.  You have nausea or vomiting.  You have abdominal pain.  You feel dizzy or light-headed. Get help right away if:  You develop sudden or severe swelling anywhere in your body. This usually happens in the legs.  You gain 5 lbs (2.3 kg) or more during one week.  You have severe: ? Abdominal pain. ? Headaches. ? Dizziness. ? Vision problems. ? Confusion. ? Nausea or vomiting.  You have a seizure.  You have trouble moving any part of your body.  You develop numbness in any part of your body.  You have trouble speaking.  You have any abnormal bleeding.  You pass out. This information is not intended to replace advice given to  you by your health care provider. Make sure you discuss any questions you have with your health care provider. Document Released: 05/27/2000 Document Revised: 01/26/2016 Document Reviewed: 01/04/2016 Elsevier Interactive Patient Education  [employer].

## 2017-08-24 NOTE — Progress Notes (Signed)
Vitals:   08/24/17 2201 08/24/17 2207  BP: (!) 147/85 138/78  Pulse: 73 75  Resp: 16 16  Temp:     Resting, rare contractions.  FHR Cat 1. PItocin at 8 mu/min.  Continue present management.

## 2017-08-25 ENCOUNTER — Encounter (HOSPITAL_COMMUNITY): Payer: Self-pay | Admitting: *Deleted

## 2017-08-25 ENCOUNTER — Inpatient Hospital Stay (HOSPITAL_COMMUNITY): Payer: Federal, State, Local not specified - PPO | Admitting: Anesthesiology

## 2017-08-25 DIAGNOSIS — Z3A38 38 weeks gestation of pregnancy: Secondary | ICD-10-CM

## 2017-08-25 DIAGNOSIS — O1493 Unspecified pre-eclampsia, third trimester: Secondary | ICD-10-CM

## 2017-08-25 LAB — CBC
HCT: 29.4 % — ABNORMAL LOW (ref 36.0–46.0)
Hemoglobin: 9.4 g/dL — ABNORMAL LOW (ref 12.0–15.0)
MCH: 25.5 pg — AB (ref 26.0–34.0)
MCHC: 32 g/dL (ref 30.0–36.0)
MCV: 79.9 fL (ref 78.0–100.0)
PLATELETS: 166 10*3/uL (ref 150–400)
RBC: 3.68 MIL/uL — AB (ref 3.87–5.11)
RDW: 13.4 % (ref 11.5–15.5)
WBC: 12 10*3/uL — AB (ref 4.0–10.5)

## 2017-08-25 LAB — RPR: RPR: NONREACTIVE

## 2017-08-25 MED ORDER — LIDOCAINE HCL (PF) 1 % IJ SOLN
INTRAMUSCULAR | Status: DC | PRN
  Administered 2017-08-25 (×2): 5 mL via EPIDURAL

## 2017-08-25 MED ORDER — PRENATAL MULTIVITAMIN CH
1.0000 | ORAL_TABLET | Freq: Every day | ORAL | Status: DC
  Administered 2017-08-26 – 2017-08-27 (×2): 1 via ORAL
  Filled 2017-08-25 (×2): qty 1

## 2017-08-25 MED ORDER — SENNOSIDES-DOCUSATE SODIUM 8.6-50 MG PO TABS
2.0000 | ORAL_TABLET | ORAL | Status: DC
  Administered 2017-08-26 (×2): 2 via ORAL
  Filled 2017-08-25 (×3): qty 2

## 2017-08-25 MED ORDER — COCONUT OIL OIL
1.0000 "application " | TOPICAL_OIL | Status: DC | PRN
  Administered 2017-08-27: 1 via TOPICAL
  Filled 2017-08-25: qty 120

## 2017-08-25 MED ORDER — DIPHENHYDRAMINE HCL 25 MG PO CAPS: 25.0000 mg | ORAL_CAPSULE | Freq: Four times a day (QID) | ORAL | Status: DC | PRN

## 2017-08-25 MED ORDER — PHENYLEPHRINE 40 MCG/ML (10ML) SYRINGE FOR IV PUSH (FOR BLOOD PRESSURE SUPPORT)
80.0000 ug | PREFILLED_SYRINGE | INTRAVENOUS | Status: DC | PRN
  Filled 2017-08-25: qty 5

## 2017-08-25 MED ORDER — TETANUS-DIPHTH-ACELL PERTUSSIS 5-2.5-18.5 LF-MCG/0.5 IM SUSP: 0.5000 mL | Freq: Once | INTRAMUSCULAR | Status: DC

## 2017-08-25 MED ORDER — ACETAMINOPHEN 325 MG PO TABS: 650.0000 mg | ORAL_TABLET | ORAL | Status: DC | PRN

## 2017-08-25 MED ORDER — ZOLPIDEM TARTRATE 5 MG PO TABS: 5.0000 mg | ORAL_TABLET | Freq: Every evening | ORAL | Status: DC | PRN

## 2017-08-25 MED ORDER — ONDANSETRON HCL 4 MG/2ML IJ SOLN: 4.0000 mg | INTRAMUSCULAR | Status: DC | PRN

## 2017-08-25 MED ORDER — EPHEDRINE 5 MG/ML INJ
10.0000 mg | INTRAVENOUS | Status: DC | PRN
  Filled 2017-08-25: qty 2

## 2017-08-25 MED ORDER — BENZOCAINE-MENTHOL 20-0.5 % EX AERO
1.0000 "application " | INHALATION_SPRAY | CUTANEOUS | Status: DC | PRN
  Administered 2017-08-25: 1 via TOPICAL
  Filled 2017-08-25: qty 56

## 2017-08-25 MED ORDER — IBUPROFEN 600 MG PO TABS
600.0000 mg | ORAL_TABLET | Freq: Four times a day (QID) | ORAL | Status: DC
  Administered 2017-08-25 – 2017-08-27 (×9): 600 mg via ORAL
  Filled 2017-08-25 (×10): qty 1

## 2017-08-25 MED ORDER — LACTATED RINGERS IV SOLN: 500.0000 mL | Freq: Once | INTRAVENOUS | Status: DC

## 2017-08-25 MED ORDER — DIBUCAINE 1 % RE OINT
1.0000 "application " | TOPICAL_OINTMENT | RECTAL | Status: DC | PRN
  Administered 2017-08-25: 1 via RECTAL
  Filled 2017-08-25: qty 28

## 2017-08-25 MED ORDER — FENTANYL 2.5 MCG/ML BUPIVACAINE 1/10 % EPIDURAL INFUSION (WH - ANES)
14.0000 mL/h | INTRAMUSCULAR | Status: DC | PRN
  Administered 2017-08-25: 14 mL/h via EPIDURAL
  Filled 2017-08-25: qty 100

## 2017-08-25 MED ORDER — PHENYLEPHRINE 40 MCG/ML (10ML) SYRINGE FOR IV PUSH (FOR BLOOD PRESSURE SUPPORT)
80.0000 ug | PREFILLED_SYRINGE | INTRAVENOUS | Status: DC | PRN
  Filled 2017-08-25: qty 10
  Filled 2017-08-25: qty 5

## 2017-08-25 MED ORDER — DIPHENHYDRAMINE HCL 50 MG/ML IJ SOLN: 12.5000 mg | INTRAMUSCULAR | Status: DC | PRN

## 2017-08-25 MED ORDER — WITCH HAZEL-GLYCERIN EX PADS
1.0000 "application " | MEDICATED_PAD | CUTANEOUS | Status: DC | PRN
  Administered 2017-08-25: 1 via TOPICAL

## 2017-08-25 MED ORDER — ONDANSETRON HCL 4 MG PO TABS: 4.0000 mg | ORAL_TABLET | ORAL | Status: DC | PRN

## 2017-08-25 MED ORDER — SIMETHICONE 80 MG PO CHEW: 80.0000 mg | CHEWABLE_TABLET | ORAL | Status: DC | PRN

## 2017-08-25 NOTE — Progress Notes (Signed)
Vitals:   08/25/17 0201 08/25/17 0223  BP: (!) 155/77 (!) 144/87  Pulse: 77 71  Resp: 18 18  Temp:     Not really contracting much. Pitocin at 16 mu/min.  FHR Cat 1.  wil continue to increase, may consider stopping pit and placing 1 cytotec if doesn't respond better to Pitocin soon

## 2017-08-25 NOTE — Anesthesia Preprocedure Evaluation (Signed)
Anesthesia Evaluation  Patient identified by MRN, date of birth, ID band Patient awake    Reviewed: Allergy & Precautions, NPO status , Patient's Chart, lab work & pertinent test results  Airway Mallampati: II  TM Distance: >3 FB Neck ROM: Full    Dental no notable dental hx. (+) Dental Advisory Given   Pulmonary neg pulmonary ROS,    Pulmonary exam normal        Cardiovascular hypertension, Normal cardiovascular exam     Neuro/Psych negative neurological ROS  negative psych ROS   GI/Hepatic negative GI ROS, Neg liver ROS,   Endo/Other  Hypothyroidism Morbid obesity  Renal/GU negative Renal ROS  negative genitourinary   Musculoskeletal negative musculoskeletal ROS (+)   Abdominal   Peds negative pediatric ROS (+)  Hematology negative hematology ROS (+)   Anesthesia Other Findings   Reproductive/Obstetrics negative OB ROS                             Anesthesia Physical Anesthesia Plan  ASA: III  Anesthesia Plan: Epidural   Post-op Pain Management:    Induction:   PONV Risk Score and Plan:   Airway Management Planned: Natural Airway  Additional Equipment:   Intra-op Plan:   Post-operative Plan:   Informed Consent: I have reviewed the patients History and Physical, chart, labs and discussed the procedure including the risks, benefits and alternatives for the proposed anesthesia with the patient or authorized representative who has indicated his/her understanding and acceptance.   Dental advisory given  Plan Discussed with: Anesthesiologist  Anesthesia Plan Comments:         Anesthesia Quick Evaluation

## 2017-08-25 NOTE — Progress Notes (Signed)
Vitals:   08/25/17 0605 08/25/17 0610  BP: (!) 145/82 (!) 142/72  Pulse: 75 81  Resp: 15 16  Temp:    SpO2: 99% 100%   Shortly after last note, pt all of the sudden started feeling ctx back to back.  Pit was titrated down (now on 8 mu/min) until ctx spaced out some.  Now ctx are q 1-2 minutes. Pt has an epidural, comfortable. Cx 4.5-80/-2. FHR Cat 1.  Will continue pitocin at present rate, keep close eye on cervix.

## 2017-08-25 NOTE — Progress Notes (Signed)
Pt had prolonged decel to 90s for 6 minutes.  Pit d/c'd, IVF bolus and 02 given,  C/o rectal pressure Cx now 9/100/+1 station.  Unable to reduce cx.  Baby now Cat 1, pit restarted at 2 mu/min.  Anticipate 2nd stage soon.

## 2017-08-25 NOTE — Anesthesia Postprocedure Evaluation (Signed)
Anesthesia Post Note  Patient: Gloria Lyons  Procedure(s) Performed: AN AD HOC LABOR EPIDURAL     Patient location during evaluation: Mother Baby Anesthesia Type: Epidural Level of consciousness: awake, awake and alert and oriented Pain management: pain level controlled Vital Signs Assessment: post-procedure vital signs reviewed and stable Respiratory status: spontaneous breathing, nonlabored ventilation and respiratory function stable Cardiovascular status: stable Postop Assessment: no headache, no backache, no apparent nausea or vomiting, adequate PO intake and patient able to bend at knees Anesthetic complications: no    Last Vitals:  Vitals:   08/25/17 1150 08/25/17 1259  BP: (!) 152/86 (!) 143/89  Pulse: (!) 115 (!) 107  Resp: 20   Temp: 37.1 C 36.9 C  SpO2:      Last Pain:  Vitals:   08/25/17 1259  TempSrc: Oral  PainSc: 0-No pain   Pain Goal:                 Brunetta Newingham

## 2017-08-25 NOTE — Anesthesia Procedure Notes (Signed)
Epidural Patient location during procedure: OB Start time: 08/25/2017 5:38 AM End time: 08/25/2017 5:50 AM  Staffing Anesthesiologist: Heather RobertsSinger, Rachit Grim, MD Performed: anesthesiologist   Preanesthetic Checklist Completed: patient identified, site marked, pre-op evaluation, timeout performed, IV checked, risks and benefits discussed and monitors and equipment checked  Epidural Patient position: sitting Prep: DuraPrep Patient monitoring: heart rate, cardiac monitor, continuous pulse ox and blood pressure Approach: midline Location: L2-L3 Injection technique: LOR saline  Needle:  Needle type: Tuohy  Needle gauge: 17 G Needle length: 9 cm Needle insertion depth: 7 cm Catheter size: 20 Guage Catheter at skin depth: 12 cm Test dose: negative and Other  Assessment Events: blood not aspirated, injection not painful, no injection resistance and negative IV test  Additional Notes Informed consent obtained prior to proceeding including risk of failure, 1% risk of PDPH, risk of minor discomfort and bruising.  Discussed rare but serious complications including epidural abscess, permanent nerve injury, epidural hematoma.  Discussed alternatives to epidural analgesia and patient desires to proceed.  Timeout performed pre-procedure verifying patient name, procedure, and platelet count.  Patient tolerated procedure well.

## 2017-08-26 DIAGNOSIS — Z3A38 38 weeks gestation of pregnancy: Secondary | ICD-10-CM | POA: Diagnosis not present

## 2017-08-26 DIAGNOSIS — O1493 Unspecified pre-eclampsia, third trimester: Secondary | ICD-10-CM | POA: Diagnosis not present

## 2017-08-26 MED ORDER — AMLODIPINE BESYLATE 5 MG PO TABS
5.0000 mg | ORAL_TABLET | Freq: Every day | ORAL | Status: DC
  Administered 2017-08-26 – 2017-08-27 (×2): 5 mg via ORAL
  Filled 2017-08-26 (×3): qty 1

## 2017-08-26 NOTE — Progress Notes (Addendum)
Notified Gloria Lyons, CNM of patient's last two blood pressures as follow up, taken at 1906 (patient had just returned from bathroom and sat down) and at 1940 (patient sitting at bedside, more relaxed, but appeared slightly anxious). BP's were 146/93 and 147/89 .   New order Patient has been encouraged to continue to drink to help relieve lower extremity swelling.  Patient reports she is voiding more.

## 2017-08-26 NOTE — Lactation Note (Signed)
This note was copied from a baby's chart. Lactation Consultation Note  Patient Name: Gloria Lyons ZOXWR'UToday's Date: 08/26/2017 Reason for consult: Initial assessment;Primapara;1st time breastfeeding;Early term 3137-38.6wks  14 hours old early term female who is being exclusively BF by her mother, she's a P1. Mom has already been set up with a NS # 24, breast shells and a hand pump by RN.   LC assisted with latch, tried to latch baby on without NS, but mom has challenging tissue in her nipples and baby wasn't able to sustain the latch. Tried NS # 24 on football hold and baby was able to maintain the latch for most of the feeding. Some swallows were heard and colostrum was observed on the NS at the end of the feeding. Mom stated that she's been leaking colostrum before baby was born, also, some colostrum was noted on breast shells pads.  Encouraged mom to keep feeding baby on cues STS 8-12 times/24 hours using NS # 24. She'll also pump or hand express every 3 hours and feed that EBM back to baby. Asked RN to set mom up with a DEBP since she'll continue using a NS. Mom will only use breast shells in between feedings and LC already readjusted the straps in her bra because they're getting too tight.  Reviewed BF brochure, BF resources and feeding diary, parents are aware of LC services and will call PRN.  Maternal Data Formula Feeding for Exclusion: No Has patient been taught Hand Expression?: Yes Does the patient have breastfeeding experience prior to this delivery?: No  Feeding Feeding Type: Breast Fed Length of feed: 15 min  LATCH Score Latch: Repeated attempts needed to sustain latch, nipple held in mouth throughout feeding, stimulation needed to elicit sucking reflex.(with NS # 24)  Audible Swallowing: A few with stimulation  Type of Nipple: Flat  Comfort (Breast/Nipple): Soft / non-tender  Hold (Positioning): Assistance needed to correctly position infant at breast and maintain  latch.  LATCH Score: 6  Interventions Interventions: Breast feeding basics reviewed;Assisted with latch;Skin to skin;Breast massage;Breast compression;Adjust position;Support pillows;Position options;Shells;Hand pump  Lactation Tools Discussed/Used Tools: Nipple Shields Nipple shield size: 24 Shell Type: Inverted WIC Program: No Pump Review: Setup, frequency, and cleaning Initiated by:: RN Date initiated:: 08/25/17   Consult Status Consult Status: Follow-up Date: 08/26/17 Follow-up type: In-patient    Manuelito Poage Venetia ConstableS Joann Kulpa 08/26/2017, 12:04 AM

## 2017-08-26 NOTE — Progress Notes (Signed)
Post Partum Day 1 Subjective: no complaints, up ad lib, voiding and tolerating PO  Objective: Blood pressure (!) 147/92, pulse 88, temperature 98.4 F (36.9 C), temperature source Oral, resp. rate 18, height 5\' 5"  (1.651 m), weight 237 lb 12 oz (107.8 kg), last menstrual period 11/28/2016, SpO2 99 %, unknown if currently breastfeeding.  Physical Exam:  General: alert, cooperative and no distress Lochia: appropriate Uterine Fundus: firm Incision: healing well, no significant drainage, no dehiscence, no significant erythema DVT Evaluation: No evidence of DVT seen on physical exam. Negative Homan's sign. No cords or calf tenderness. No significant calf/ankle edema.  Recent Labs    08/24/17 1816 08/25/17 0503  HGB 9.9* 9.4*  HCT 30.7* 29.4*   Assessment/Plan: Plan for discharge tomorrow and Breastfeeding Preeclampsia, delivered-stable Watch BPs> consider antihypertensive if remains elevated   LOS: 2 days   Donette LarryMelanie Jada Kuhnert, CNM 08/26/2017, 10:47 AM

## 2017-08-27 DIAGNOSIS — O1493 Unspecified pre-eclampsia, third trimester: Secondary | ICD-10-CM | POA: Diagnosis not present

## 2017-08-27 DIAGNOSIS — Z3A38 38 weeks gestation of pregnancy: Secondary | ICD-10-CM | POA: Diagnosis not present

## 2017-08-27 MED ORDER — IBUPROFEN 600 MG PO TABS: 600.0000 mg | ORAL_TABLET | Freq: Four times a day (QID) | ORAL | 0 refills | Status: DC

## 2017-08-27 MED ORDER — AMLODIPINE BESYLATE 5 MG PO TABS: 5.0000 mg | ORAL_TABLET | Freq: Every day | ORAL | 0 refills | Status: DC

## 2017-08-27 NOTE — Lactation Note (Signed)
This note was copied from a baby's chart. Lactation Consultation Note  Patient Name: Gloria Lyons Today's Date: 08/27/2017  Mom states baby still struggles with latch even with nipple shield.  Her plan is to pump and bottle feed for now.  She is pleased baby is getting breast milk.  She last pumped 40 mls so transitioned baby from syringe to bottle.  Instructed to pump every 2-3 hours.  Mom has a personal DEBP at home.  Lactation outpatient services and support reviewed and encouraged prn.   Maternal Data    Feeding Feeding Type: Breast Milk  LATCH Score                   Interventions    Lactation Tools Discussed/Used     Consult Status      Huston FoleyMOULDEN, Darsha Zumstein S 08/27/2017, 8:43 AM

## 2017-08-27 NOTE — Discharge Summary (Signed)
OB Discharge Summary     Patient Name: Gloria Lyons DOB: 1991-01-27 MRN: [id number]  Date of admission: 08/24/2017 Delivering MD: Jacklyn ShellRESENZO-DISHMON, FRANCES   Date of discharge: 08/27/2017  Admitting diagnosis: 38.3WKS HBP, PROTEIN IN URINE Intrauterine pregnancy: 6580w4d     Secondary diagnosis:  Principal Problem:   SVD (spontaneous vaginal delivery) Active Problems:   Supervision of normal first pregnancy   Pregnancy induced hypertension     Discharge diagnosis: Term Pregnancy Delivered                                                                                                Post partum procedures:none  Augmentation: Pitocin  Complications: None  Hospital course:  Induction of Labor With Vaginal Delivery   27 y.o. yo G1P1001 at 4980w4d was admitted to the hospital 08/24/2017 for induction of labor.  Indication for induction: Gestational hypertension. S/p IOL with IV Pitocin. Patient had an uncomplicated labor course as follows: Membrane Rupture Time/Date: 6:20 AM ,08/25/2017   Intrapartum Procedures: Episiotomy: None [1]                                         Lacerations:  2nd degree [3];Vaginal [6]  Patient had delivery of a Viable infant.  Information for the patient's newborn:  Gloria Lyons, Girl Gloria Lyons [[id number]][[id number]]  Delivery Method: Vag-Spont   08/25/2017  Details of delivery can be found in separate delivery note.  Postpartum course was complicated by persistently elevated blood pressures, and she was started on amlodipine 5 mg daily. Patient is discharged home 08/27/17.  Physical exam  Vitals:   08/26/17 1906 08/26/17 1940 08/26/17 2051 08/27/17 0511  BP: (!) 146/93 (!) 147/89 (!) 149/89 132/83  Pulse: 76 77 89 88  Resp: 16   18  Temp: 98.2 F (36.8 C)   98.3 F (36.8 C)  TempSrc: Oral   Oral  SpO2: 100%   100%  Weight:      Height:       General: alert, cooperative and no distress Lochia: appropriate Uterine Fundus: firm Incision: N/A DVT Evaluation: No  evidence of DVT seen on physical exam. Trace BLE edema Labs: Lab Results  Component Value Date   WBC 12.0 (H) 08/25/2017   HGB 9.4 (L) 08/25/2017   HCT 29.4 (L) 08/25/2017   MCV 79.9 08/25/2017   PLT 166 08/25/2017   CMP Latest Ref Rng & Units 08/24/2017  Glucose 65 - 99 mg/dL 086(V123(H)  BUN 6 - 20 mg/dL <7(Q<5(L)  Creatinine 4.690.44 - 1.00 mg/dL 6.290.57  Sodium 528135 - 413145 mmol/L 136  Potassium 3.5 - 5.1 mmol/L 3.1(L)  Chloride 101 - 111 mmol/L 105  CO2 22 - 32 mmol/L 21(L)  Calcium 8.9 - 10.3 mg/dL 2.4(M8.4(L)  Total Protein 6.5 - 8.1 g/dL 6.2(L)  Total Bilirubin 0.3 - 1.2 mg/dL 0.6  Alkaline Phos 38 - 126 U/L 171(H)  AST 15 - 41 U/L 19  ALT 14 - 54 U/L 12(L)    Discharge instruction: per After Visit  Summary and "Baby and Me Booklet".  After visit meds:  Allergies as of 08/27/2017   No Known Allergies     Medication List    TAKE these medications   amLODipine 5 MG tablet Commonly known as:  NORVASC Take 1 tablet (5 mg total) by mouth daily.   calcium carbonate 500 MG chewable tablet Commonly known as:  TUMS - dosed in mg elemental calcium Chew 1 tablet by mouth 2 (two) times daily as needed for indigestion or heartburn.   ibuprofen 600 MG tablet Commonly known as:  ADVIL,MOTRIN Take 1 tablet (600 mg total) by mouth every 6 (six) hours.   PRENATAL GUMMIES/DHA & FA 0.4-32.5 MG Chew Chew 2 capsules by mouth daily.   sodium chloride 0.65 % Soln nasal spray Commonly known as:  OCEAN Place 1 spray into both nostrils as needed for congestion.       Diet: routine diet  Activity: Advance as tolerated. Pelvic rest for 6 weeks.   Outpatient follow up: BP check in 5 days; PPV in 4 weeks Follow up Appt: Future Appointments  Date Time Provider Department Center  09/01/2017 12:00 PM [facility] NURSE TECH [facility]  09/27/2017 11:00 AM [name], Scarlette Calico, CNM [facility]    Postpartum contraception: Progesterone only pills  Newborn Data: Live born female   Birth Weight: 8 lb 12.2 oz (3975 g) APGAR: 9, 9  Newborn Delivery   Birth date/time:  08/25/2017 09:17:00 Delivery type:  Vaginal, Spontaneous     Baby Feeding: Breast Disposition:home with mother   08/27/2017 Gloria Pear, MD

## 2017-08-27 NOTE — Discharge Instructions (Signed)
Postpartum Care After Vaginal Delivery °The period of time right after you deliver your newborn is called the postpartum period. °What kind of medical care will I receive? °· You may continue to receive fluids and medicines through an IV tube inserted into one of your veins. °· If an incision was made near your vagina (episiotomy) or if you had some vaginal tearing during delivery, cold compresses may be placed on your episiotomy or your tear. This helps to reduce pain and swelling. °· You may be given a squirt bottle to use when you go to the bathroom. You may use this until you are comfortable wiping as usual. To use the squirt bottle, follow these steps: °? Before you urinate, fill the squirt bottle with warm water. Do not use hot water. °? After you urinate, while you are sitting on the toilet, use the squirt bottle to rinse the area around your urethra and vaginal opening. This rinses away any urine and blood. °? You may do this instead of wiping. As you start healing, you may use the squirt bottle before wiping yourself. Make sure to wipe gently. °? Fill the squirt bottle with clean water every time you use the bathroom. °· You will be given sanitary pads to wear. °How can I expect to feel? °· You may not feel the need to urinate for several hours after delivery. °· You will have some soreness and pain in your abdomen and vagina. °· If you are breastfeeding, you may have uterine contractions every time you breastfeed for up to several weeks postpartum. Uterine contractions help your uterus return to its normal size. °· It is normal to have vaginal bleeding (lochia) after delivery. The amount and appearance of lochia is often similar to a menstrual period in the first week after delivery. It will gradually decrease over the next few weeks to a dry, yellow-brown discharge. For most women, lochia stops completely by 6-8 weeks after delivery. Vaginal bleeding can vary from woman to woman. °· Within the first few  days after delivery, you may have breast engorgement. This is when your breasts feel heavy, full, and uncomfortable. Your breasts may also throb and feel hard, tightly stretched, warm, and tender. After this occurs, you may have milk leaking from your breasts. Your health care provider can help you relieve discomfort due to breast engorgement. Breast engorgement should go away within a few days. °· You may feel more sad or worried than normal due to hormonal changes after delivery. These feelings should not last more than a few days. If these feelings do not go away after several days, speak with your health care provider. °How should I care for myself? °· Tell your health care provider if you have pain or discomfort. °· Drink enough water to keep your urine clear or pale yellow. °· Wash your hands thoroughly with soap and water for at least 20 seconds after changing your sanitary pads, after using the toilet, and before holding or feeding your baby. °· If you are not breastfeeding, avoid touching your breasts a lot. Doing this can make your breasts produce more milk. °· If you become weak or lightheaded, or you feel like you might faint, ask for help before: °? Getting out of bed. °? Showering. °· Change your sanitary pads frequently. Watch for any changes in your flow, such as a sudden increase in volume, a change in color, the passing of large blood clots. If you pass a blood clot from your vagina, save it   to show to your health care provider. Do not flush blood clots down the toilet without having your health care provider look at them. °· Make sure that all your vaccinations are up to date. This can help protect you and your baby from getting certain diseases. You may need to have immunizations done before you leave the hospital. °· If desired, talk with your health care provider about methods of family planning or birth control (contraception). °How can I start bonding with my baby? °Spending as much time as  possible with your baby is very important. During this time, you and your baby can get to know each other and develop a bond. Having your baby stay with you in your room (rooming in) can give you time to get to know your baby. Rooming in can also help you become comfortable caring for your baby. Breastfeeding can also help you bond with your baby. °How can I plan for returning home with my baby? °· Make sure that you have a car seat installed in your vehicle. °? Your car seat should be checked by a certified car seat installer to make sure that it is installed safely. °? Make sure that your baby fits into the car seat safely. °· Ask your health care provider any questions you have about caring for yourself or your baby. Make sure that you are able to contact your health care provider with any questions after leaving the hospital. °This information is not intended to replace advice given to you by your health care provider. Make sure you discuss any questions you have with your health care provider. °Document Released: 03/27/2007 Document Revised: 11/02/2015 Document Reviewed: 05/04/2015 °Elsevier Interactive Patient Education © 2018 Elsevier Inc. ° °

## 2017-08-28 ENCOUNTER — Encounter: Payer: Federal, State, Local not specified - PPO | Admitting: Women's Health

## 2017-09-01 ENCOUNTER — Ambulatory Visit: Payer: Federal, State, Local not specified - PPO | Admitting: *Deleted

## 2017-09-01 ENCOUNTER — Encounter: Payer: Self-pay | Admitting: *Deleted

## 2017-09-01 VITALS — BP 136/90

## 2017-09-01 DIAGNOSIS — Z0130 Encounter for examination of blood pressure without abnormal findings: Secondary | ICD-10-CM

## 2017-09-01 NOTE — Progress Notes (Signed)
Pt here for BP check. BP today was 136/90. Pt has been taking med around 3pm. I spoke with Selena BattenKim, CNM and she advised to take med in the am. Come back Monday am for BP check. Take med 1 hour before appt Monday. Pt voiced understanding. JSY

## 2017-09-04 ENCOUNTER — Ambulatory Visit: Payer: Federal, State, Local not specified - PPO

## 2017-09-04 VITALS — BP 120/60 | HR 98 | Wt 205.0 lb

## 2017-09-04 DIAGNOSIS — Z0130 Encounter for examination of blood pressure without abnormal findings: Secondary | ICD-10-CM

## 2017-09-04 NOTE — Progress Notes (Signed)
Pt here for blood pressure 120/60. Spoke with kim booker about reading. Advise continue taking Amlopine 5 mg. Stop taking medication 2 days before pp visit.Pad CMA

## 2017-09-27 ENCOUNTER — Encounter: Payer: Self-pay | Admitting: Advanced Practice Midwife

## 2017-09-27 ENCOUNTER — Ambulatory Visit (INDEPENDENT_AMBULATORY_CARE_PROVIDER_SITE_OTHER): Payer: Federal, State, Local not specified - PPO | Admitting: Advanced Practice Midwife

## 2017-09-27 VITALS — BP 112/88 | HR 89 | Wt 202.0 lb

## 2017-09-27 DIAGNOSIS — Z3202 Encounter for pregnancy test, result negative: Secondary | ICD-10-CM

## 2017-09-27 LAB — POCT URINE PREGNANCY: Preg Test, Ur: NEGATIVE

## 2017-09-27 MED ORDER — NORETHIN-ETH ESTRAD-FE BIPHAS 1 MG-10 MCG / 10 MCG PO TABS: 1.0000 | ORAL_TABLET | Freq: Every day | ORAL | 11 refills | Status: DC

## 2017-09-27 NOTE — Progress Notes (Signed)
Gloria Lyons is a 27 y.o. who presents for a postpartum visit. She is 4 weeks postpartum following a spontaneous vaginal delivery. I have fully reviewed the prenatal and intrapartum course. She had mild PreE, BP check at 1 weeks was normal on Norvasc 5mg . The delivery was at 38.3 gestational weeks.  Anesthesia: epidural. Postpartum course has been uneventful. Baby's course has been uneventful. Baby is feeding by bottle.(pumped for 2 weeks) Bleeding: staining only. Bowel function is normal. Bladder function is normal. Patient is not sexually active. Contraception method is abstinence. Postpartum depression screening: negative. Stopped BP med 3 days ago  Current Outpatient Medications:  .  Prenatal MV-Min-FA-Omega-3 (PRENATAL GUMMIES/DHA & FA) 0.4-32.5 MG CHEW, Chew 2 capsules by mouth daily., Disp: , Rfl:  .  amLODipine (NORVASC) 5 MG tablet, Take 1 tablet (5 mg total) by mouth daily. (Patient not taking: Reported on 09/27/2017), Disp: 30 tablet, Rfl: 0 .  docusate sodium (COLACE) 100 MG capsule, Take 100 mg by mouth as needed for mild constipation., Disp: , Rfl:  .  ibuprofen (ADVIL,MOTRIN) 600 MG tablet, Take 1 tablet (600 mg total) by mouth every 6 (six) hours. (Patient not taking: Reported on 09/27/2017), Disp: 60 tablet, Rfl: 0  Review of Systems  Constitutional: Negative for fever and chills Eyes: Negative for visual disturbances Respiratory: Negative for shortness of breath, dyspnea Cardiovascular: Negative for chest pain or palpitations  Gastrointestinal: Negative for vomiting, diarrhea and constipation Genitourinary: Negative for dysuria and urgency Musculoskeletal: Negative for back pain, joint pain, myalgias  Neurological: Negative for dizziness and headaches    Objective:     Vitals:   09/27/17 1115  BP: 112/88  Pulse: 89   General:  alert, cooperative and no distress   Breasts:  negative  Lungs: Normal respiratory effort  Heart:  regular rate and rhythm  Abdomen: Soft,  nontender   Vulva:  normal  Vagina: normal vagina, healing nicely  Cervix:  closed  Corpus: Well involuted     Rectal Exam: no hemorrhoids        Assessment:    normal postpartum exam.  Plan:   1. Contraception: OCP (estrogen/progesterone) Start the Sunday after baby turns 286 weeks old. BU for 2 weeks unless starts on period.  2. May stay off BP meds

## 2017-09-27 NOTE — Patient Instructions (Addendum)
Start birth control pill on the Sunday after Arna Mediciora turns 196 weeks old. Back up for 2 weeks unless you are starting on day 1-4 of your period.     Oral Contraception Information Oral contraceptive pills (OCPs) are medicines taken to prevent pregnancy. OCPs work by preventing the ovaries from releasing eggs. The hormones in OCPs also cause the cervical mucus to thicken, preventing the sperm from entering the uterus. The hormones also cause the uterine lining to become thin, not allowing a fertilized egg to attach to the inside of the uterus. OCPs are highly effective when taken exactly as prescribed. However, OCPs do not prevent sexually transmitted diseases (STDs). Safe sex practices, such as using condoms along with the pill, can help prevent STDs. Before taking the pill, you may have a physical exam and Pap test. Your health care provider may order blood tests. The health care provider will make sure you are a good candidate for oral contraception. Discuss with your health care provider the possible side effects of the OCP you may be prescribed. When starting an OCP, it can take 2 to 3 months for the body to adjust to the changes in hormone levels in your body. Types of oral contraception  The combination pill-This pill contains estrogen and progestin (synthetic progesterone) hormones. The combination pill comes in 21-day, 28-day, or 91-day packs. Some types of combination pills are meant to be taken continuously (365-day pills). With 21-day packs, you do not take pills for 7 days after the last pill. With 28-day packs, the pill is taken every day. The last 7 pills are without hormones. Certain types of pills have more than 21 hormone-containing pills. With 91-day packs, the first 84 pills contain both hormones, and the last 7 pills contain no hormones or contain estrogen only.  The minipill-This pill contains the progesterone hormone only. The pill is taken every day continuously. It is very important to  take the pill at the same time each day. The minipill comes in packs of 28 pills. All 28 pills contain the hormone. Advantages of oral contraceptive pills  Decreases premenstrual symptoms.  Treats menstrual period cramps.  Regulates the menstrual cycle.  Decreases a heavy menstrual flow.  May treatacne, depending on the type of pill.  Treats abnormal uterine bleeding.  Treats polycystic ovarian syndrome.  Treats endometriosis.  Can be used as emergency contraception. Things that can make oral contraceptive pills less effective OCPs can be less effective if:  You forget to take the pill at the same time every day.  You have a stomach or intestinal disease that lessens the absorption of the pill.  You take OCPs with other medicines that make OCPs less effective, such as antibiotics, certain HIV medicines, and some seizure medicines.  You take expired OCPs.  You forget to restart the pill on day 7, when using the packs of 21 pills.  Risks associated with oral contraceptive pills Oral contraceptive pills can sometimes cause side effects, such as:  Headache.  Nausea.  Breast tenderness.  Irregular bleeding or spotting.  Combination pills are also associated with a small increased risk of:  Blood clots.  Heart attack.  Stroke.  This information is not intended to replace advice given to you by your health care provider. Make sure you discuss any questions you have with your health care provider. Document Released: 08/20/2002 Document Revised: 11/05/2015 Document Reviewed: 11/18/2012 Elsevier Interactive Patient Education  [employer]2018 Else[employer] Inc.

## 2018-01-14 ENCOUNTER — Encounter: Payer: Self-pay | Admitting: Advanced Practice Midwife

## 2018-06-13 NOTE — L&D Delivery Note (Signed)
Delivery Note Progressed rapidly from 6cm to complete in one hour  At 12:36 AM a viable and healthy female was delivered via Vaginal, Spontaneous (Presentation:LOA ).  APGAR: 9, ; weight  .   Placenta status: spontaneous and grossly intact with 3 vessel Cord:  with the following complications: none  Anesthesia:  Epidural  Episiotomy: None Lacerations: None Suture Repair: none Est. Blood Loss (mL): 23  Mom to postpartum.  Baby to Couplet care / Skin to Skin.  Hansel Feinstein 02/08/2019, 1:27 AM  Please schedule this patient for Postpartum visit in: 1 week with the following provider: Any provider For C/S patients schedule nurse incision check in weeks 2 weeks: no High risk pregnancy complicated by: HTN Delivery mode:  SVD Anticipated Birth Control:  BTL done PP PP Procedures needed: BP check  Schedule Integrated [place] visit: no

## 2018-08-09 ENCOUNTER — Encounter: Payer: Self-pay | Admitting: Adult Health

## 2018-08-09 ENCOUNTER — Ambulatory Visit: Payer: Federal, State, Local not specified - PPO | Admitting: Adult Health

## 2018-08-09 VITALS — BP 135/88 | HR 104 | Ht 65.0 in | Wt 227.0 lb

## 2018-08-09 DIAGNOSIS — Z3201 Encounter for pregnancy test, result positive: Secondary | ICD-10-CM

## 2018-08-09 DIAGNOSIS — N926 Irregular menstruation, unspecified: Secondary | ICD-10-CM | POA: Diagnosis not present

## 2018-08-09 DIAGNOSIS — Z3490 Encounter for supervision of normal pregnancy, unspecified, unspecified trimester: Secondary | ICD-10-CM | POA: Insufficient documentation

## 2018-08-09 DIAGNOSIS — O3680X0 Pregnancy with inconclusive fetal viability, not applicable or unspecified: Secondary | ICD-10-CM

## 2018-08-09 LAB — POCT URINE PREGNANCY: PREG TEST UR: POSITIVE — AB

## 2018-08-09 MED ORDER — PRENATAL PLUS 27-1 MG PO TABS: 1.0000 | ORAL_TABLET | Freq: Every day | ORAL | 12 refills | Status: DC

## 2018-08-09 NOTE — Progress Notes (Signed)
Patient ID: Gloria Lyons, female   DOB: [birth year], 28 y.o.   MRN: [id number] History of Present Illness: Gloria Lyons is a 28 year old white female, married in for UPT, has missed periods, was on lo Loestrin, may have been late taking the pill but not missed any and has had 2 +HPTs, stopped OCs. She works for [employer].She had vaginal delivery 08/2017.  PCP [facility].  Current Medications, Allergies, Past Medical History, Past Surgical History, Family History and Social History were reviewed in [employer] record.     Review of Systems: +missed periods,2+HPTs     Physical Exam:BP 135/88 (BP Location: Left Arm, Patient Position: Sitting, Cuff Size: Large)   Pulse (!) 104   Ht 5\' 5"  (1.651 m)   Wt 227 lb (103 kg)   LMP 03/21/2018   Breastfeeding No   BMI 37.77 kg/m UPT +, about 20=1 by LMP with EDD 12/27/18 but I doubt she is the far, had negtive HPT in December and had been on lo Loestrin till +HPT General:  Well developed, well nourished, no acute distress Skin:  Warm and dry Neck:  Midline trachea, normal thyroid, good ROM, no lymphadenopathy Lungs; Clear to auscultation bilaterally Cardiovascular: Regular rate and rhythm Abdomen:  Soft, non tender, not 20 weeks  Psych:  No mood changes, alert and cooperative,seems happy Will get [place] next week   Impression: 1. Pregnancy examination or test, positive result   2. Pregnancy, unspecified gestational age   28. Encounter to determine fetal viability of pregnancy, single or unspecified fetus       Plan:  Note given no lifting over 25 lbs Return in 1 week for dating US/ 2 weeks new OB Review handouts on First trimester and second trimester and by [facility] ordered this encounter  Medications  . prenatal vitamin w/FE, FA (PRENATAL 1 + 1) 27-1 MG TABS tablet    Sig: Take 1 tablet by mouth daily at 12 noon.    Dispense:  30 each    Refill:  12    Order Specific Question:   Supervising Provider   Answer:   Duane Lope H [[id number]]

## 2018-08-09 NOTE — Patient Instructions (Signed)
Second Trimester of Pregnancy  The second trimester is from week 14 through week 27 (month 4 through 6). This is often the time in pregnancy that you feel your best. Often times, morning sickness has lessened or quit. You may have more energy, and you may get hungry more often. Your unborn baby is growing rapidly. At the end of the sixth month, he or she is about 9 inches long and weighs about 1 pounds. You will likely feel the baby move between 18 and 20 weeks of pregnancy. Follow these instructions at home: Medicines  Take over-the-counter and prescription medicines only as told by your doctor. Some medicines are safe and some medicines are not safe during pregnancy.  Take a prenatal vitamin that contains at least 600 micrograms (mcg) of folic acid.  If you have trouble pooping (constipation), take medicine that will make your stool soft (stool softener) if your doctor approves. Eating and drinking   Eat regular, healthy meals.  Avoid raw meat and uncooked cheese.  If you get low calcium from the food you eat, talk to your doctor about taking a daily calcium supplement.  Avoid foods that are high in fat and sugars, such as fried and sweet foods.  If you feel sick to your stomach (nauseous) or throw up (vomit): ? Eat 4 or 5 small meals a day instead of 3 large meals. ? Try eating a few soda crackers. ? Drink liquids between meals instead of during meals.  To prevent constipation: ? Eat foods that are high in fiber, like fresh fruits and vegetables, whole grains, and beans. ? Drink enough fluids to keep your pee (urine) clear or pale yellow. Activity  Exercise only as told by your doctor. Stop exercising if you start to have cramps.  Do not exercise if it is too hot, too humid, or if you are in a place of great height (high altitude).  Avoid heavy lifting.  Wear low-heeled shoes. Sit and stand up straight.  You can continue to have sex unless your doctor tells you not to.  Relieving pain and discomfort  Wear a good support bra if your breasts are tender.  Take warm water baths (sitz baths) to soothe pain or discomfort caused by hemorrhoids. Use hemorrhoid cream if your doctor approves.  Rest with your legs raised if you have leg cramps or low back pain.  If you develop puffy, bulging veins (varicose veins) in your legs: ? Wear support hose or compression stockings as told by your doctor. ? Raise (elevate) your feet for 15 minutes, 3-4 times a day. ? Limit salt in your food. Prenatal care  Write down your questions. Take them to your prenatal visits.  Keep all your prenatal visits as told by your doctor. This is important. Safety  Wear your seat belt when driving.  Make a list of emergency phone numbers, including numbers for family, friends, the hospital, and police and fire departments. General instructions  Ask your doctor about the right foods to eat or for help finding a counselor, if you need these services.  Ask your doctor about local prenatal classes. Begin classes before month 6 of your pregnancy.  Do not use hot tubs, steam rooms, or saunas.  Do not douche or use tampons or scented sanitary pads.  Do not cross your legs for long periods of time.  Visit your dentist if you have not done so. Use a soft toothbrush to brush your teeth. Floss gently.  Avoid all smoking, herbs,   and alcohol. Avoid drugs that are not approved by your doctor.  Do not use any products that contain nicotine or tobacco, such as cigarettes and e-cigarettes. If you need help quitting, ask your doctor.  Avoid cat litter boxes and soil used by cats. These carry germs that can cause birth defects in the baby and can cause a loss of your baby (miscarriage) or stillbirth. Contact a doctor if:  You have mild cramps or pressure in your lower belly.  You have pain when you pee (urinate).  You have bad smelling fluid coming from your vagina.  You continue to feel  sick to your stomach (nauseous), throw up (vomit), or have watery poop (diarrhea).  You have a nagging pain in your belly area.  You feel dizzy. Get help right away if:  You have a fever.  You are leaking fluid from your vagina.  You have spotting or bleeding from your vagina.  You have severe belly cramping or pain.  You lose or gain weight rapidly.  You have trouble catching your breath and have chest pain.  You notice sudden or extreme puffiness (swelling) of your face, hands, ankles, feet, or legs.  You have not felt the baby move in over an hour.  You have severe headaches that do not go away when you take medicine.  You have trouble seeing. Summary  The second trimester is from week 14 through week 27 (months 4 through 6). This is often the time in pregnancy that you feel your best.  To take care of yourself and your unborn baby, you will need to eat healthy meals, take medicines only if your doctor tells you to do so, and do activities that are safe for you and your baby.  Call your doctor if you get sick or if you notice anything unusual about your pregnancy. Also, call your doctor if you need help with the right food to eat, or if you want to know what activities are safe for you. This information is not intended to replace advice given to you by your health care provider. Make sure you discuss any questions you have with your health care provider. Document Released: 08/24/2009 Document Revised: 07/05/2016 Document Reviewed: 07/05/2016 Elsevier Interactive Patient Education  2019 [employer] First Trimester of Pregnancy The first trimester of pregnancy is from week 1 until the end of week 13 (months 1 through 3). A week after a sperm fertilizes an egg, the egg will implant on the wall of the uterus. This embryo will begin to develop into a baby. Genes from you and your partner will form the baby. The female genes will determine whether the baby will be a boy or a girl.  At 6-8 weeks, the eyes and face will be formed, and the heartbeat can be seen on ultrasound. At the end of 12 weeks, all the baby's organs will be formed. Now that you are pregnant, you will want to do everything you can to have a healthy baby. Two of the most important things are to get good prenatal care and to follow your health care provider's instructions. Prenatal care is all the medical care you receive before the baby's birth. This care will help prevent, find, and treat any problems during the pregnancy and childbirth. Body changes during your first trimester Your body goes through many changes during pregnancy. The changes vary from woman to woman.  You may gain or lose a couple of pounds at first.  You may feel sick   to your stomach (nauseous) and you may throw up (vomit). If the vomiting is uncontrollable, call your health care provider.  You may tire easily.  You may develop headaches that can be relieved by medicines. All medicines should be approved by your health care provider.  You may urinate more often. Painful urination may mean you have a bladder infection.  You may develop heartburn as a result of your pregnancy.  You may develop constipation because certain hormones are causing the muscles that push stool through your intestines to slow down.  You may develop hemorrhoids or swollen veins (varicose veins).  Your breasts may begin to grow larger and become tender. Your nipples may stick out more, and the tissue that surrounds them (areola) may become darker.  Your gums may bleed and may be sensitive to brushing and flossing.  Dark spots or blotches (chloasma, mask of pregnancy) may develop on your face. This will likely fade after the baby is born.  Your menstrual periods will stop.  You may have a loss of appetite.  You may develop cravings for certain kinds of food.  You may have changes in your emotions from day to day, such as being excited to be pregnant or  being concerned that something may go wrong with the pregnancy and baby.  You may have more vivid and strange dreams.  You may have changes in your hair. These can include thickening of your hair, rapid growth, and changes in texture. Some women also have hair loss during or after pregnancy, or hair that feels dry or thin. Your hair will most likely return to normal after your baby is born. What to expect at prenatal visits During a routine prenatal visit:  You will be weighed to make sure you and the baby are growing normally.  Your blood pressure will be taken.  Your abdomen will be measured to track your baby's growth.  The fetal heartbeat will be listened to between weeks 10 and 14 of your pregnancy.  Test results from any previous visits will be discussed. Your health care provider may ask you:  How you are feeling.  If you are feeling the baby move.  If you have had any abnormal symptoms, such as leaking fluid, bleeding, severe headaches, or abdominal cramping.  If you are using any tobacco products, including cigarettes, chewing tobacco, and electronic cigarettes.  If you have any questions. Other tests that may be performed during your first trimester include:  Blood tests to find your blood type and to check for the presence of any previous infections. The tests will also be used to check for low iron levels (anemia) and protein on red blood cells (Rh antibodies). Depending on your risk factors, or if you previously had diabetes during pregnancy, you may have tests to check for high blood sugar that affects pregnant women (gestational diabetes).  Urine tests to check for infections, diabetes, or protein in the urine.  An ultrasound to confirm the proper growth and development of the baby.  Fetal screens for spinal cord problems (spina bifida) and Down syndrome.  HIV (human immunodeficiency virus) testing. Routine prenatal testing includes screening for HIV, unless you  choose not to have this test.  You may need other tests to make sure you and the baby are doing well. Follow these instructions at home: Medicines  Follow your health care provider's instructions regarding medicine use. Specific medicines may be either safe or unsafe to take during pregnancy.  Take a prenatal vitamin   that contains at least 600 micrograms (mcg) of folic acid.  If you develop constipation, try taking a stool softener if your health care provider approves. Eating and drinking   Eat a balanced diet that includes fresh fruits and vegetables, whole grains, good sources of protein such as meat, eggs, or tofu, and low-fat dairy. Your health care provider will help you determine the amount of weight gain that is right for you.  Avoid raw meat and uncooked cheese. These carry germs that can cause birth defects in the baby.  Eating four or five small meals rather than three large meals a day may help relieve nausea and vomiting. If you start to feel nauseous, eating a few soda crackers can be helpful. Drinking liquids between meals, instead of during meals, also seems to help ease nausea and vomiting.  Limit foods that are high in fat and processed sugars, such as fried and sweet foods.  To prevent constipation: ? Eat foods that are high in fiber, such as fresh fruits and vegetables, whole grains, and beans. ? Drink enough fluid to keep your urine clear or pale yellow. Activity  Exercise only as directed by your health care provider. Most women can continue their usual exercise routine during pregnancy. Try to exercise for 30 minutes at least 5 days a week. Exercising will help you: ? Control your weight. ? Stay in shape. ? Be prepared for labor and delivery.  Experiencing pain or cramping in the lower abdomen or lower back is a good sign that you should stop exercising. Check with your health care provider before continuing with normal exercises.  Try to avoid standing for  long periods of time. Move your legs often if you must stand in one place for a long time.  Avoid heavy lifting.  Wear low-heeled shoes and practice good posture.  You may continue to have sex unless your health care provider tells you not to. Relieving pain and discomfort  Wear a good support bra to relieve breast tenderness.  Take warm sitz baths to soothe any pain or discomfort caused by hemorrhoids. Use hemorrhoid cream if your health care provider approves.  Rest with your legs elevated if you have leg cramps or low back pain.  If you develop varicose veins in your legs, wear support hose. Elevate your feet for 15 minutes, 3-4 times a day. Limit salt in your diet. Prenatal care  Schedule your prenatal visits by the twelfth week of pregnancy. They are usually scheduled monthly at first, then more often in the last 2 months before delivery.  Write down your questions. Take them to your prenatal visits.  Keep all your prenatal visits as told by your health care provider. This is important. Safety  Wear your seat belt at all times when driving.  Make a list of emergency phone numbers, including numbers for family, friends, the hospital, and police and fire departments. General instructions  Ask your health care provider for a referral to a local prenatal education class. Begin classes no later than the beginning of month 6 of your pregnancy.  Ask for help if you have counseling or nutritional needs during pregnancy. Your health care provider can offer advice or refer you to specialists for help with various needs.  Do not use hot tubs, steam rooms, or saunas.  Do not douche or use tampons or scented sanitary pads.  Do not cross your legs for long periods of time.  Avoid cat litter boxes and soil used by cats.   These carry germs that can cause birth defects in the baby and possibly loss of the fetus by miscarriage or stillbirth.  Avoid all smoking, herbs, alcohol, and  medicines not prescribed by your health care provider. Chemicals in these products affect the formation and growth of the baby.  Do not use any products that contain nicotine or tobacco, such as cigarettes and e-cigarettes. If you need help quitting, ask your health care provider. You may receive counseling support and other resources to help you quit.  Schedule a dentist appointment. At home, brush your teeth with a soft toothbrush and be gentle when you floss. Contact a health care provider if:  You have dizziness.  You have mild pelvic cramps, pelvic pressure, or nagging pain in the abdominal area.  You have persistent nausea, vomiting, or diarrhea.  You have a bad smelling vaginal discharge.  You have pain when you urinate.  You notice increased swelling in your face, hands, legs, or ankles.  You are exposed to fifth disease or chickenpox.  You are exposed to German measles (rubella) and have never had it. Get help right away if:  You have a fever.  You are leaking fluid from your vagina.  You have spotting or bleeding from your vagina.  You have severe abdominal cramping or pain.  You have rapid weight gain or loss.  You vomit blood or material that looks like coffee grounds.  You develop a severe headache.  You have shortness of breath.  You have any kind of trauma, such as from a fall or a car accident. Summary  The first trimester of pregnancy is from week 1 until the end of week 13 (months 1 through 3).  Your body goes through many changes during pregnancy. The changes vary from woman to woman.  You will have routine prenatal visits. During those visits, your health care provider will examine you, discuss any test results you may have, and talk with you about how you are feeling. This information is not intended to replace advice given to you by your health care provider. Make sure you discuss any questions you have with your health care provider. Document  Released: 05/24/2001 Document Revised: 05/11/2016 Document Reviewed: 05/11/2016 Elsevier Interactive Patient Education  2019 [employer]  

## 2018-08-16 ENCOUNTER — Other Ambulatory Visit: Payer: Self-pay | Admitting: Adult Health

## 2018-08-16 ENCOUNTER — Ambulatory Visit (INDEPENDENT_AMBULATORY_CARE_PROVIDER_SITE_OTHER): Payer: Federal, State, Local not specified - PPO

## 2018-08-16 DIAGNOSIS — O3680X0 Pregnancy with inconclusive fetal viability, not applicable or unspecified: Secondary | ICD-10-CM

## 2018-08-16 DIAGNOSIS — Z3A21 21 weeks gestation of pregnancy: Secondary | ICD-10-CM | POA: Diagnosis not present

## 2018-08-16 NOTE — Progress Notes (Signed)
[place] 14+1 wks,single IUP,CRL 82.33 mm,normal ovaries bilat,fhr 167 bpm,fundal placenta gr 0, limited ultrasound because of fetal position

## 2018-08-22 ENCOUNTER — Other Ambulatory Visit: Payer: Federal, State, Local not specified - PPO

## 2018-08-27 ENCOUNTER — Encounter: Payer: Self-pay | Admitting: Women's Health

## 2018-08-27 ENCOUNTER — Ambulatory Visit (INDEPENDENT_AMBULATORY_CARE_PROVIDER_SITE_OTHER): Payer: Federal, State, Local not specified - PPO | Admitting: Women's Health

## 2018-08-27 ENCOUNTER — Other Ambulatory Visit: Payer: Self-pay

## 2018-08-27 ENCOUNTER — Ambulatory Visit: Payer: Federal, State, Local not specified - PPO | Admitting: *Deleted

## 2018-08-27 VITALS — BP 116/80 | HR 90 | Wt 226.0 lb

## 2018-08-27 DIAGNOSIS — Z363 Encounter for antenatal screening for malformations: Secondary | ICD-10-CM

## 2018-08-27 DIAGNOSIS — O09299 Supervision of pregnancy with other poor reproductive or obstetric history, unspecified trimester: Secondary | ICD-10-CM

## 2018-08-27 DIAGNOSIS — Z3A15 15 weeks gestation of pregnancy: Secondary | ICD-10-CM | POA: Diagnosis not present

## 2018-08-27 DIAGNOSIS — Z1389 Encounter for screening for other disorder: Secondary | ICD-10-CM

## 2018-08-27 DIAGNOSIS — Z3482 Encounter for supervision of other normal pregnancy, second trimester: Secondary | ICD-10-CM

## 2018-08-27 DIAGNOSIS — Z23 Encounter for immunization: Secondary | ICD-10-CM

## 2018-08-27 DIAGNOSIS — E039 Hypothyroidism, unspecified: Secondary | ICD-10-CM | POA: Diagnosis not present

## 2018-08-27 DIAGNOSIS — Z3490 Encounter for supervision of normal pregnancy, unspecified, unspecified trimester: Secondary | ICD-10-CM | POA: Insufficient documentation

## 2018-08-27 DIAGNOSIS — Z331 Pregnant state, incidental: Secondary | ICD-10-CM

## 2018-08-27 DIAGNOSIS — O09292 Supervision of pregnancy with other poor reproductive or obstetric history, second trimester: Secondary | ICD-10-CM

## 2018-08-27 LAB — POCT URINALYSIS DIPSTICK OB
Blood, UA: NEGATIVE
GLUCOSE, UA: NEGATIVE
Ketones, UA: NEGATIVE
LEUKOCYTES UA: NEGATIVE
NITRITE UA: NEGATIVE
PROTEIN: NEGATIVE

## 2018-08-27 NOTE — Progress Notes (Signed)
INITIAL OBSTETRICAL VISIT Patient name: Gloria Lyons MRN [id number]  Date of birth: 07/01/1990 Chief Complaint:   Initial Prenatal Visit  History of Present Illness:   Gloria Lyons is a 27 y.o. G18P1001 Caucasian female at [redacted]w[redacted]d by 14wk u/s, with an Estimated Date of Delivery: 02/13/19 being seen today for her initial obstetrical visit.  Conceived on COCs, was late maybe taking 3 times- but always took the next am. Her obstetrical history is significant for term SVB after IOL for mild pre-e.   H/O hypothyroidism- no meds Today she reports no complaints.  Patient's last menstrual period was 03/21/2018. Last pap 02/21/17. Results were: normal Review of Systems:   Pertinent items are noted in HPI Denies cramping/contractions, leakage of fluid, vaginal bleeding, abnormal vaginal discharge w/ itching/odor/irritation, headaches, visual changes, shortness of breath, chest pain, abdominal pain, severe nausea/vomiting, or problems with urination or bowel movements unless otherwise stated above.  Pertinent History Reviewed:  Reviewed past medical,surgical, social, obstetrical and family history.  Reviewed problem list, medications and allergies. OB History  Gravida Para Term Preterm AB Living  2 1 1     1   SAB TAB Ectopic Multiple Live Births        0 1    # Outcome Date GA Lbr Len/2nd Weight Sex Delivery Anes PTL Lv  2 Current           1 Term 08/25/17 [redacted]w[redacted]d 03:16 / 00:31 8 lb 12.2 oz (3.975 kg) F Vag-Spont EPI  LIV   Physical Assessment:   Vitals:   08/27/18 1515  BP: 116/80  Pulse: 90  Weight: 226 lb (102.5 kg)  Body mass index is 37.61 kg/m.       Physical Examination:  General appearance - well appearing, and in no distress  Mental status - alert, oriented to person, place, and time  Psych:  She has a normal mood and affect  Skin - warm and dry, normal color, no suspicious lesions noted  Chest - effort normal, all lung fields clear to auscultation bilaterally  Heart -  normal rate and regular rhythm  Abdomen - soft, nontender  Extremities:  No swelling or varicosities noted  Thin prep pap is not done  Fetal Heart Rate (bpm): 161 via doppler  Results for orders placed or performed in visit on 08/27/18 (from the past 24 hour(s))  POC Urinalysis Dipstick OB   Collection Time: 08/27/18  3:27 PM  Result Value Ref Range   Color, UA     Clarity, UA     Glucose, UA Negative Negative   Bilirubin, UA     Ketones, UA neg    Spec Grav, UA     Blood, UA neg    pH, UA     POC,PROTEIN,UA Negative Negative, Trace, Small (1+), Moderate (2+), Large (3+), 4+   Urobilinogen, UA     Nitrite, UA neg    Leukocytes, UA Negative Negative   Appearance     Odor      Assessment & Plan:  1) Low-Risk Pregnancy G2P1001 at [redacted]w[redacted]d with an Estimated Date of Delivery: 02/13/19   2) Initial OB visit  3) H/O pre-e> start ASA 162mg , get baseline labs today  4) H/O hypothyroidism> no meds, will check TSH  5) Late care  Meds: No orders of the defined types were placed in this encounter.   Initial labs obtained Continue prenatal vitamins Reviewed n/v relief measures and warning s/s to report Reviewed recommended weight gain based on pre-gravid  BMI Encouraged well-balanced diet Genetic Screening discussed: declined Cystic fibrosis, SMA, Fragile X screening discussed declined Ultrasound discussed; fetal survey: requested CCNC completed>not applying for preg mcaid  Follow-up: Return in about 3 weeks (around 09/17/2018) for LROB, VZ:DGLOVFI.   Orders Placed This Encounter  Procedures  . GC/Chlamydia Probe Amp  . Urine Culture  . US OB Comp + 14 Wk  . Flu Vaccine QUAD 36+ mos IM  . Obstetric Panel, Including HIV  . Urinalysis, Routine w reflex microscopic  . Sickle cell screen  . Pain Management Screening Profile (10S)  . Comprehensive metabolic panel  . Protein / creatinine ratio, urine  . TSH  . POC Urinalysis Dipstick OB    Cheral Marker CNM, [facility]  08/27/2018 4:05 PM

## 2018-08-27 NOTE — Patient Instructions (Signed)
Gloria Lyons, I greatly value your feedback.  If you receive a survey following your visit with [place] today, we appreciate you taking the time to fill it out.  Thanks, Gloria Lyons, CNM, [facility]  [facility] HAS MOVED!!! It is now [facility] at [facility] ([address] [place], [place] [address]) Entrance located off of [address] Free 24/7 valet parking   Begin taking 162mg  (two 81mg  tablets) baby aspirin daily to decrease risk of preeclampsia during pregnancy      Second Trimester of Pregnancy The second trimester is from week 14 through week 27 (months 4 through 6). The second trimester is often a time when you feel your best. Your body has adjusted to being pregnant, and you begin to feel better physically. Usually, morning sickness has lessened or quit completely, you may have more energy, and you may have an increase in appetite. The second trimester is also a time when the fetus is growing rapidly. At the end of the sixth month, the fetus is about 9 inches long and weighs about 1 pounds. You will likely begin to feel the baby move (quickening) between 16 and 20 weeks of pregnancy. Body changes during your second trimester Your body continues to go through many changes during your second trimester. The changes vary from woman to woman.  Your weight will continue to increase. You will notice your lower abdomen bulging out.  You may begin to get stretch marks on your hips, abdomen, and breasts.  You may develop headaches that can be relieved by medicines. The medicines should be approved by your health care provider.  You may urinate more often because the fetus is pressing on your bladder.  You may develop or continue to have heartburn as a result of your pregnancy.  You may develop constipation because certain hormones are causing the muscles that push waste through your intestines to slow down.  You may develop hemorrhoids or swollen, bulging veins  (varicose veins).  You may have back pain. This is caused by: ? Weight gain. ? Pregnancy hormones that are relaxing the joints in your pelvis. ? A shift in weight and the muscles that support your balance.  Your breasts will continue to grow and they will continue to become tender.  Your gums may bleed and may be sensitive to brushing and flossing.  Dark spots or blotches (chloasma, mask of pregnancy) may develop on your face. This will likely fade after the baby is born.  A dark line from your belly button to the pubic area (linea nigra) may appear. This will likely fade after the baby is born.  You may have changes in your hair. These can include thickening of your hair, rapid growth, and changes in texture. Some women also have hair loss during or after pregnancy, or hair that feels dry or thin. Your hair will most likely return to normal after your baby is born.  What to expect at prenatal visits During a routine prenatal visit:  You will be weighed to make sure you and the fetus are growing normally.  Your blood pressure will be taken.  Your abdomen will be measured to track your baby's growth.  The fetal heartbeat will be listened to.  Any test results from the previous visit will be discussed.  Your health care provider may ask you:  How you are feeling.  If you are feeling the baby move.  If you have had any abnormal symptoms, such as  leaking fluid, bleeding, severe headaches, or abdominal cramping.  If you are using any tobacco products, including cigarettes, chewing tobacco, and electronic cigarettes.  If you have any questions.  Other tests that may be performed during your second trimester include:  Blood tests that check for: ? Low iron levels (anemia). ? High blood sugar that affects pregnant women (gestational diabetes) between 6624 and 28 weeks. ? Rh antibodies. This is to check for a protein on red blood cells (Rh factor).  Urine tests to check for  infections, diabetes, or protein in the urine.  An ultrasound to confirm the proper growth and development of the baby.  An amniocentesis to check for possible genetic problems.  Fetal screens for spina bifida and Down syndrome.  HIV (human immunodeficiency virus) testing. Routine prenatal testing includes screening for HIV, unless you choose not to have this test.  Follow these instructions at home: Medicines  Follow your health care provider's instructions regarding medicine use. Specific medicines may be either safe or unsafe to take during pregnancy.  Take a prenatal vitamin that contains at least 600 micrograms (mcg) of folic acid.  If you develop constipation, try taking a stool softener if your health care provider approves. Eating and drinking  Eat a balanced diet that includes fresh fruits and vegetables, whole grains, good sources of protein such as meat, eggs, or tofu, and low-fat dairy. Your health care provider will help you determine the amount of weight gain that is right for you.  Avoid raw meat and uncooked cheese. These carry germs that can cause birth defects in the baby.  If you have low calcium intake from food, talk to your health care provider about whether you should take a daily calcium supplement.  Limit foods that are high in fat and processed sugars, such as fried and sweet foods.  To prevent constipation: ? Drink enough fluid to keep your urine clear or pale yellow. ? Eat foods that are high in fiber, such as fresh fruits and vegetables, whole grains, and beans. Activity  Exercise only as directed by your health care provider. Most women can continue their usual exercise routine during pregnancy. Try to exercise for 30 minutes at least 5 days a week. Stop exercising if you experience uterine contractions.  Avoid heavy lifting, wear low heel shoes, and practice good posture.  A sexual relationship may be continued unless your health care provider  directs you otherwise. Relieving pain and discomfort  Wear a good support bra to prevent discomfort from breast tenderness.  Take warm sitz baths to soothe any pain or discomfort caused by hemorrhoids. Use hemorrhoid cream if your health care provider approves.  Rest with your legs elevated if you have leg cramps or low back pain.  If you develop varicose veins, wear support hose. Elevate your feet for 15 minutes, 3-4 times a day. Limit salt in your diet. Prenatal Care  Write down your questions. Take them to your prenatal visits.  Keep all your prenatal visits as told by your health care provider. This is important. Safety  Wear your seat belt at all times when driving.  Make a list of emergency phone numbers, including numbers for family, friends, the hospital, and police and fire departments. General instructions  Ask your health care provider for a referral to a local prenatal education class. Begin classes no later than the beginning of month 6 of your pregnancy.  Ask for help if you have counseling or nutritional needs during pregnancy. Your health  care provider can offer advice or refer you to specialists for help with various needs.  Do not use hot tubs, steam rooms, or saunas.  Do not douche or use tampons or scented sanitary pads.  Do not cross your legs for long periods of time.  Avoid cat litter boxes and soil used by cats. These carry germs that can cause birth defects in the baby and possibly loss of the fetus by miscarriage or stillbirth.  Avoid all smoking, herbs, alcohol, and unprescribed drugs. Chemicals in these products can affect the formation and growth of the baby.  Do not use any products that contain nicotine or tobacco, such as cigarettes and e-cigarettes. If you need help quitting, ask your health care provider.  Visit your dentist if you have not gone yet during your pregnancy. Use a soft toothbrush to brush your teeth and be gentle when you  floss. Contact a health care provider if:  You have dizziness.  You have mild pelvic cramps, pelvic pressure, or nagging pain in the abdominal area.  You have persistent nausea, vomiting, or diarrhea.  You have a bad smelling vaginal discharge.  You have pain when you urinate. Get help right away if:  You have a fever.  You are leaking fluid from your vagina.  You have spotting or bleeding from your vagina.  You have severe abdominal cramping or pain.  You have rapid weight gain or weight loss.  You have shortness of breath with chest pain.  You notice sudden or extreme swelling of your face, hands, ankles, feet, or legs.  You have not felt your baby move in over an hour.  You have severe headaches that do not go away when you take medicine.  You have vision changes. Summary  The second trimester is from week 14 through week 27 (months 4 through 6). It is also a time when the fetus is growing rapidly.  Your body goes through many changes during pregnancy. The changes vary from woman to woman.  Avoid all smoking, herbs, alcohol, and unprescribed drugs. These chemicals affect the formation and growth your baby.  Do not use any tobacco products, such as cigarettes, chewing tobacco, and e-cigarettes. If you need help quitting, ask your health care provider.  Contact your health care provider if you have any questions. Keep all prenatal visits as told by your health care provider. This is important. This information is not intended to replace advice given to you by your health care provider. Make sure you discuss any questions you have with your health care provider. Document Released: 05/24/2001 Document Revised: 11/05/2015 Document Reviewed: 07/31/2012 Elsevier Interactive Patient Education  2017 [employer].

## 2018-08-28 LAB — COMPREHENSIVE METABOLIC PANEL
ALT: 12 IU/L (ref 0–32)
AST: 12 IU/L (ref 0–40)
Albumin/Globulin Ratio: 1.9 (ref 1.2–2.2)
Albumin: 4.1 g/dL (ref 3.9–5.0)
Alkaline Phosphatase: 45 IU/L (ref 39–117)
BUN/Creatinine Ratio: 11 (ref 9–23)
BUN: 4 mg/dL — AB (ref 6–20)
Bilirubin Total: 0.2 mg/dL (ref 0.0–1.2)
CO2: 22 mmol/L (ref 20–29)
Calcium: 9.4 mg/dL (ref 8.7–10.2)
Chloride: 104 mmol/L (ref 96–106)
Creatinine, Ser: 0.36 mg/dL — ABNORMAL LOW (ref 0.57–1.00)
GFR calc Af Amer: 170 mL/min/{1.73_m2} (ref >59)
GFR calc non Af Amer: 147 mL/min/{1.73_m2} (ref >59)
Globulin, Total: 2.2 g/dL (ref 1.5–4.5)
Glucose: 80 mg/dL (ref 65–99)
Potassium: 4.4 mmol/L (ref 3.5–5.2)
Sodium: 139 mmol/L (ref 134–144)
TOTAL PROTEIN: 6.3 g/dL (ref 6.0–8.5)

## 2018-08-28 LAB — PROTEIN / CREATININE RATIO, URINE
Creatinine, Urine: 216.3 mg/dL
Protein, Ur: 16.6 mg/dL
Protein/Creat Ratio: 77 mg/g creat (ref 0–200)

## 2018-08-28 LAB — OBSTETRIC PANEL, INCLUDING HIV
Antibody Screen: NEGATIVE
BASOS ABS: 0 10*3/uL (ref 0.0–0.2)
Basos: 0 %
EOS (ABSOLUTE): 0.2 10*3/uL (ref 0.0–0.4)
EOS: 2 %
HEMOGLOBIN: 12.6 g/dL (ref 11.1–15.9)
HEP B S AG: NEGATIVE
HIV Screen 4th Generation wRfx: NONREACTIVE
Hematocrit: 36.9 % (ref 34.0–46.6)
IMMATURE GRANULOCYTES: 1 %
Immature Grans (Abs): 0.1 10*3/uL (ref 0.0–0.1)
LYMPHS ABS: 1.4 10*3/uL (ref 0.7–3.1)
Lymphs: 13 %
MCH: 29.2 pg (ref 26.6–33.0)
MCHC: 34.1 g/dL (ref 31.5–35.7)
MCV: 85 fL (ref 79–97)
MONOCYTES: 7 %
Monocytes Absolute: 0.8 10*3/uL (ref 0.1–0.9)
NEUTROS ABS: 8.4 10*3/uL — AB (ref 1.4–7.0)
Neutrophils: 77 %
Platelets: 229 10*3/uL (ref 150–450)
RBC: 4.32 x10E6/uL (ref 3.77–5.28)
RDW: 12.5 % (ref 11.7–15.4)
RH TYPE: POSITIVE
RPR: NONREACTIVE
RUBELLA: 6.31 {index} (ref >0.99)
WBC: 10.8 10*3/uL (ref 3.4–10.8)

## 2018-08-28 LAB — URINALYSIS, ROUTINE W REFLEX MICROSCOPIC
BILIRUBIN UA: NEGATIVE
Glucose, UA: NEGATIVE
Ketones, UA: NEGATIVE
Leukocytes, UA: NEGATIVE
Nitrite, UA: NEGATIVE
PH UA: 6.5 (ref 5.0–7.5)
Protein, UA: NEGATIVE
RBC UA: NEGATIVE
Specific Gravity, UA: >=1.03 — AB (ref 1.005–1.030)
Urobilinogen, Ur: 1 mg/dL (ref 0.2–1.0)

## 2018-08-28 LAB — SICKLE CELL SCREEN: Sickle Cell Screen: NEGATIVE

## 2018-08-28 LAB — TSH: TSH: 1.01 u[IU]/mL (ref 0.450–4.500)

## 2018-09-01 ENCOUNTER — Encounter: Payer: Self-pay | Admitting: Advanced Practice Midwife

## 2018-09-14 ENCOUNTER — Telehealth: Payer: Self-pay | Admitting: *Deleted

## 2018-09-14 NOTE — Telephone Encounter (Signed)
Patient informed that we are not allowing visitors or children to come to appointments at this time.. Pt denies fever, cough, sob, muscle pain, diarrhea, rash, vomiting, abdominal pain, red eye, weakness, bruising or bleeding, joint pain or severe headache.  Pt states that she was around her cousin on march 15 who was tested for COVID-19. She states she has not been around him since then. She states that a week ago she had a mild cough and mild shortness of breath. Advised pt that since she had been symptom free for 72 hours and no fever, she was fine to keep appt.

## 2018-09-17 ENCOUNTER — Ambulatory Visit: Payer: Federal, State, Local not specified - PPO

## 2018-09-17 ENCOUNTER — Encounter: Payer: Federal, State, Local not specified - PPO | Admitting: Obstetrics & Gynecology

## 2018-09-17 ENCOUNTER — Other Ambulatory Visit: Payer: Self-pay

## 2018-09-28 ENCOUNTER — Encounter: Payer: Self-pay | Admitting: Obstetrics and Gynecology

## 2018-09-28 ENCOUNTER — Ambulatory Visit (INDEPENDENT_AMBULATORY_CARE_PROVIDER_SITE_OTHER): Payer: Federal, State, Local not specified - PPO | Admitting: Obstetrics and Gynecology

## 2018-09-28 ENCOUNTER — Other Ambulatory Visit: Payer: Self-pay

## 2018-09-28 ENCOUNTER — Ambulatory Visit (INDEPENDENT_AMBULATORY_CARE_PROVIDER_SITE_OTHER): Payer: Federal, State, Local not specified - PPO

## 2018-09-28 VITALS — BP 116/73 | HR 94 | Temp 98.8°F | Wt 222.6 lb

## 2018-09-28 DIAGNOSIS — Z3482 Encounter for supervision of other normal pregnancy, second trimester: Secondary | ICD-10-CM

## 2018-09-28 DIAGNOSIS — Z363 Encounter for antenatal screening for malformations: Secondary | ICD-10-CM

## 2018-09-28 DIAGNOSIS — Z1389 Encounter for screening for other disorder: Secondary | ICD-10-CM

## 2018-09-28 DIAGNOSIS — Z3A20 20 weeks gestation of pregnancy: Secondary | ICD-10-CM | POA: Diagnosis not present

## 2018-09-28 DIAGNOSIS — Z331 Pregnant state, incidental: Secondary | ICD-10-CM

## 2018-09-28 DIAGNOSIS — E038 Other specified hypothyroidism: Secondary | ICD-10-CM

## 2018-09-28 DIAGNOSIS — Z3402 Encounter for supervision of normal first pregnancy, second trimester: Secondary | ICD-10-CM

## 2018-09-28 DIAGNOSIS — Z3A15 15 weeks gestation of pregnancy: Secondary | ICD-10-CM | POA: Diagnosis not present

## 2018-09-28 LAB — POCT URINALYSIS DIPSTICK OB
Blood, UA: NEGATIVE
Glucose, UA: NEGATIVE
Ketones, UA: NEGATIVE
Leukocytes, UA: NEGATIVE
Nitrite, UA: NEGATIVE
POC,PROTEIN,UA: NEGATIVE

## 2018-09-28 NOTE — Progress Notes (Signed)
   LOW-RISK PREGNANCY VISIT Patient name: Gloria Lyons MRN [id number]  Date of birth: Jul 10, 1990 Chief Complaint:   Routine Prenatal Visit ([place] today)  History of Present Illness:   Gloria Lyons is a 28 y.o. G2P1001 female at [redacted]w[redacted]d with an Estimated Date of Delivery: 02/13/19 being seen today for ongoing management of a low-risk pregnancy.  Today she reports no complaints. Contractions: Not present. Vag. Bleeding: None.  Movement: Present. denies leaking of fluid. Review of Systems:   Pertinent items are noted in HPI Denies abnormal vaginal discharge w/ itching/odor/irritation, headaches, visual changes, shortness of breath, chest pain, abdominal pain, severe nausea/vomiting, or problems with urination or bowel movements unless otherwise stated above. Pertinent History Reviewed:  Reviewed past medical,surgical, social, obstetrical and family history.  Reviewed problem list, medications and allergies. Physical Assessment:   Vitals:   09/28/18 1042  BP: 116/73  Pulse: 94  Temp: 98.8 F (37.1 C)  Weight: 222 lb 9.6 oz (101 kg)  Body mass index is 37.04 kg/m.        Physical Examination:   General appearance: Well appearing, and in no distress  Mental status: Alert, oriented to person, place, and time  Skin: Warm & dry  Cardiovascular: Normal heart rate noted  Respiratory: Normal respiratory effort, no distress  Abdomen: Soft, gravid, nontender  Pelvic: Cervical exam deferred         Extremities: Edema: Trace  Fetal Status:     Movement: Present    Results for orders placed or performed in visit on 09/28/18 (from the past 24 hour(s))  POC Urinalysis Dipstick OB   Collection Time: 09/28/18 10:43 AM  Result Value Ref Range   Color, UA     Clarity, UA     Glucose, UA Negative Negative   Bilirubin, UA     Ketones, UA neg    Spec Grav, UA     Blood, UA neg    pH, UA     POC,PROTEIN,UA Negative Negative, Trace, Small (1+), Moderate (2+), Large (3+), 4+   Urobilinogen, UA      Nitrite, UA neg    Leukocytes, UA Negative Negative   Appearance     Odor      Assessment & Plan:  1) Low-risk pregnancy G2P1001 at [redacted]w[redacted]d with an Estimated Date of Delivery: 02/13/19   2) hypothyroid hx, normal TSH now,    Meds: No orders of the defined types were placed in this encounter.  Labs/procedures today: u/s anatomy  Plan:  Continue routine obstetrical care   Reviewed:  labor symptoms and general obstetric precautions including but not limited to vaginal bleeding, contractions, leaking of fluid and fetal movement were reviewed in detail with the patient.  All questions were answered  Follow-up: No follow-ups on file.  Orders Placed This Encounter  Procedures  . POC Urinalysis Dipstick OB   Tilda Burrow CNM, [facility] 09/28/2018 11:21 AM

## 2018-09-28 NOTE — Progress Notes (Signed)
[place] 20+2 wks,cephalic,cx 3.1 cm,anterior placenta gr 0,normal ovaries bilat,fhr 154 bpm,EFW 391 g 81%,anatomy complete,no obvious abnormalities

## 2018-09-29 LAB — MED LIST OPTION NOT SELECTED

## 2018-09-30 LAB — URINE CULTURE

## 2018-10-01 LAB — PMP SCREEN PROFILE (10S), URINE
Amphetamine Scrn, Ur: NEGATIVE ng/mL
BARBITURATE SCREEN URINE: NEGATIVE ng/mL
BENZODIAZEPINE SCREEN, URINE: NEGATIVE ng/mL
CANNABINOIDS UR QL SCN: NEGATIVE ng/mL
CREATININE(CRT), U: 186.2 mg/dL (ref 20.0–300.0)
Cocaine (Metab) Scrn, Ur: NEGATIVE ng/mL
Methadone Screen, Urine: NEGATIVE ng/mL
OXYCODONE+OXYMORPHONE UR QL SCN: NEGATIVE ng/mL
Opiate Scrn, Ur: NEGATIVE ng/mL
Ph of Urine: 5.9 (ref 4.5–8.9)
Phencyclidine Qn, Ur: NEGATIVE ng/mL
Propoxyphene Scrn, Ur: NEGATIVE ng/mL

## 2018-10-01 LAB — GC/CHLAMYDIA PROBE AMP
Chlamydia trachomatis, NAA: NEGATIVE
NEISSERIA GONORRHOEAE, BY PCR: NEGATIVE

## 2018-10-24 ENCOUNTER — Encounter: Payer: Self-pay | Admitting: *Deleted

## 2018-10-25 ENCOUNTER — Ambulatory Visit (INDEPENDENT_AMBULATORY_CARE_PROVIDER_SITE_OTHER): Payer: Federal, State, Local not specified - PPO | Admitting: Women's Health

## 2018-10-25 ENCOUNTER — Encounter: Payer: Self-pay | Admitting: Women's Health

## 2018-10-25 ENCOUNTER — Other Ambulatory Visit: Payer: Self-pay

## 2018-10-25 DIAGNOSIS — Z3A24 24 weeks gestation of pregnancy: Secondary | ICD-10-CM

## 2018-10-25 DIAGNOSIS — Z3482 Encounter for supervision of other normal pregnancy, second trimester: Secondary | ICD-10-CM

## 2018-10-25 MED ORDER — BLOOD PRESSURE MONITOR MISC: 0 refills | Status: DC

## 2018-10-25 NOTE — Patient Instructions (Addendum)
Gloria DurhamAshley [name], I greatly value your feedback.  If you receive a survey following your visit with us today, we appreciate you taking the time to fill it out.  Thanks, Gloria HaffKim Ritu Lyons, CNM, WHNP-BC   You will have your sugar test next visit.  Please do not eat or drink anything after midnight the night before you come, not even water.  You will be here for at least two hours.     [facility] now [facility] ([address] , [place] [id number]) Entrance located off of [address] Free 24/7 valet parking   Home Blood Pressure Monitoring for Patients   Your provider has recommended that you check your blood pressure (BP) at least once a week at home. If you do not have a blood pressure cuff at home, one will be provided for you. Contact your provider if you have not received your monitor within 1 week.   Helpful Tips for Accurate Home Blood Pressure Checks  . Don't smoke, exercise, or drink caffeine 30 minutes before checking your BP . Use the restroom before checking your BP (a full bladder can raise your pressure) . Relax in a comfortable upright chair . Feet on the ground . Left arm resting comfortably on a flat surface at the level of your heart . Legs uncrossed . Back supported . Sit quietly and don't talk . Place the cuff on your bare arm . Adjust snuggly, so that only two fingertips can fit between your skin and the top of the cuff . Check 2 readings separated by at least one minute . Keep a log of your BP readings . For a visual, please reference this diagram: http://ccnc.care/bpdiagram  Provider Name: [facility]     Phone: [phone]  Zone 1: ALL CLEAR  Continue to monitor your symptoms:  . BP reading is less than 140 (top number) or less than 90 (bottom number)  . No right upper stomach pain . No headaches or seeing spots . No feeling nauseated or throwing up . No swelling in face and hands  Zone 2:  CAUTION Call your doctor's office for any of the following:  . BP reading is greater than 140 (top number) or greater than 90 (bottom number)  . Stomach pain under your ribs in the middle or right side . Headaches or seeing spots . Feeling nauseated or throwing up . Swelling in face and hands  Zone 3: EMERGENCY  Seek immediate medical care if you have any of the following:  . BP reading is greater than160 (top number) or greater than 110 (bottom number) . Severe headaches not improving with Tylenol . Serious difficulty catching your breath . Any worsening symptoms from Zone 2     Call the office [phone]) or go to [facility] if:  You begin to have strong, frequent contractions  Your water breaks.  Sometimes it is a big gush of fluid, sometimes it is just a trickle that keeps getting your panties wet or running down your legs  You have vaginal bleeding.  It is normal to have a small amount of spotting if your cervix was checked.   You don't feel your baby moving like normal.  If you don't, get you something to eat and drink and lay down and focus on feeling your baby move.   If your baby is still not moving like normal, you should call the office or go to  [facility].  Second Trimester of Pregnancy The second trimester is from week 13 through week 28, months 4 through 6. The second trimester is often a time when you feel your best. Your body has also adjusted to being pregnant, and you begin to feel better physically. Usually, morning sickness has lessened or quit completely, you may have more energy, and you may have an increase in appetite. The second trimester is also a time when the fetus is growing rapidly. At the end of the sixth month, the fetus is about 9 inches long and weighs about 1 pounds. You will likely begin to feel the baby move (quickening) between 18 and 20 weeks of the pregnancy. BODY CHANGES Your body goes through many changes during pregnancy. The  changes vary from woman to woman.   Your weight will continue to increase. You will notice your lower abdomen bulging out.  You may begin to get stretch marks on your hips, abdomen, and breasts.  You may develop headaches that can be relieved by medicines approved by your health care provider.  You may urinate more often because the fetus is pressing on your bladder.  You may develop or continue to have heartburn as a result of your pregnancy.  You may develop constipation because certain hormones are causing the muscles that push waste through your intestines to slow down.  You may develop hemorrhoids or swollen, bulging veins (varicose veins).  You may have back pain because of the weight gain and pregnancy hormones relaxing your joints between the bones in your pelvis and as a result of a shift in weight and the muscles that support your balance.  Your breasts will continue to grow and be tender.  Your gums may bleed and may be sensitive to brushing and flossing.  Dark spots or blotches (chloasma, mask of pregnancy) may develop on your face. This will likely fade after the baby is born.  A dark line from your belly button to the pubic area (linea nigra) may appear. This will likely fade after the baby is born.  You may have changes in your hair. These can include thickening of your hair, rapid growth, and changes in texture. Some women also have hair loss during or after pregnancy, or hair that feels dry or thin. Your hair will most likely return to normal after your baby is born. WHAT TO EXPECT AT YOUR PRENATAL VISITS During a routine prenatal visit:  You will be weighed to make sure you and the fetus are growing normally.  Your blood pressure will be taken.  Your abdomen will be measured to track your baby's growth.  The fetal heartbeat will be listened to.  Any test results from the previous visit will be discussed. Your health care provider may ask you:  How you are  feeling.  If you are feeling the baby move.  If you have had any abnormal symptoms, such as leaking fluid, bleeding, severe headaches, or abdominal cramping.  If you have any questions. Other tests that may be performed during your second trimester include:  Blood tests that check for:  Low iron levels (anemia).  Gestational diabetes (between 24 and 28 weeks).  Rh antibodies.  Urine tests to check for infections, diabetes, or protein in the urine.  An ultrasound to confirm the proper growth and development of the baby.  An amniocentesis to check for possible genetic problems.  Fetal screens for spina bifida and Down syndrome. HOME CARE INSTRUCTIONS   Avoid all smoking,  herbs, alcohol, and unprescribed drugs. These chemicals affect the formation and growth of the baby.  Follow your health care provider's instructions regarding medicine use. There are medicines that are either safe or unsafe to take during pregnancy.  Exercise only as directed by your health care provider. Experiencing uterine cramps is a good sign to stop exercising.  Continue to eat regular, healthy meals.  Wear a good support bra for breast tenderness.  Do not use hot tubs, steam rooms, or saunas.  Wear your seat belt at all times when driving.  Avoid raw meat, uncooked cheese, cat litter boxes, and soil used by cats. These carry germs that can cause birth defects in the baby.  Take your prenatal vitamins.  Try taking a stool softener (if your health care provider approves) if you develop constipation. Eat more high-fiber foods, such as fresh vegetables or fruit and whole grains. Drink plenty of fluids to keep your urine clear or pale yellow.  Take warm sitz baths to soothe any pain or discomfort caused by hemorrhoids. Use hemorrhoid cream if your health care provider approves.  If you develop varicose veins, wear support hose. Elevate your feet for 15 minutes, 3-4 times a day. Limit salt in your  diet.  Avoid heavy lifting, wear low heel shoes, and practice good posture.  Rest with your legs elevated if you have leg cramps or low back pain.  Visit your dentist if you have not gone yet during your pregnancy. Use a soft toothbrush to brush your teeth and be gentle when you floss.  A sexual relationship may be continued unless your health care provider directs you otherwise.  Continue to go to all your prenatal visits as directed by your health care provider. SEEK MEDICAL CARE IF:   You have dizziness.  You have mild pelvic cramps, pelvic pressure, or nagging pain in the abdominal area.  You have persistent nausea, vomiting, or diarrhea.  You have a bad smelling vaginal discharge.  You have pain with urination. SEEK IMMEDIATE MEDICAL CARE IF:   You have a fever.  You are leaking fluid from your vagina.  You have spotting or bleeding from your vagina.  You have severe abdominal cramping or pain.  You have rapid weight gain or loss.  You have shortness of breath with chest pain.  You notice sudden or extreme swelling of your face, hands, ankles, feet, or legs.  You have not felt your baby move in over an hour.  You have severe headaches that do not go away with medicine.  You have vision changes. Document Released: 05/24/2001 Document Revised: 06/04/2013 Document Reviewed: 07/31/2012 [facility] Patient Information 2015 [place], [place]. This information is not intended to replace advice given to you by your health care provider. Make sure you discuss any questions you have with your health care provider.     Coronavirus (COVID-19) Are you at risk?  Are you at risk for the Coronavirus (COVID-19)?  To be considered HIGH RISK for Coronavirus (COVID-19), you have to meet the following criteria:  . Traveled to [place], [place], [place], [place] or [place]; or in the [place] to [place], [place], [place], or [place]; and have fever, cough, and shortness of  breath within the last 2 weeks of travel OR . Been in close contact with a person diagnosed with COVID-19 within the last 2 weeks and have fever, cough, and shortness of breath . IF YOU DO NOT MEET THESE CRITERIA, YOU ARE CONSIDERED LOW RISK FOR  COVID-19.  What to do if you are HIGH RISK for COVID-19?  Marland Kitchen If you are having a medical emergency, call 911. . Seek medical care right away. Before you go to a doctor's office, urgent care or emergency department, call ahead and tell them about your recent travel, contact with someone diagnosed with COVID-19, and your symptoms. You should receive instructions from your physician's office regarding next steps of care.  . When you arrive at healthcare provider, tell the healthcare staff immediately you have returned from visiting [place], [place], [place], [place] or [place]; or traveled in the [place] to [place], [place], [place], or [place]; in the last two weeks or you have been in close contact with a person diagnosed with COVID-19 in the last 2 weeks.   . Tell the health care staff about your symptoms: fever, cough and shortness of breath. . After you have been seen by a medical provider, you will be either: o Tested for (COVID-19) and discharged home on quarantine except to seek medical care if symptoms worsen, and asked to  - Stay home and avoid contact with others until you get your results (4-5 days)  - Avoid travel on public transportation if possible (such as bus, train, or airplane) or o Sent to the Emergency Department by EMS for evaluation, COVID-19 testing, and possible admission depending on your condition and test results.  What to do if you are LOW RISK for COVID-19?  Reduce your risk of any infection by using the same precautions used for avoiding the common cold or flu:  Marland Kitchen Wash your hands often with soap and warm water for at least 20 seconds.  If soap and water are not readily available, use an alcohol-based hand sanitizer  with at least 60% alcohol.  . If coughing or sneezing, cover your mouth and nose by coughing or sneezing into the elbow areas of your shirt or coat, into a tissue or into your sleeve (not your hands). . Avoid shaking hands with others and consider head nods or verbal greetings only. . Avoid touching your eyes, nose, or mouth with unwashed hands.  . Avoid close contact with people who are sick. . Avoid places or events with large numbers of people in one location, like concerts or sporting events. . Carefully consider travel plans you have or are making. . If you are planning any travel outside or inside the [place], visit the CDC's Travelers' Health webpage for the latest health notices. . If you have some symptoms but not all symptoms, continue to monitor at home and seek medical attention if your symptoms worsen. . If you are having a medical emergency, call 911.   ADDITIONAL HEALTHCARE OPTIONS FOR PATIENTS  [facility] / e-Visit: [email or web address]         [facility]: [phone]  [facility]: [phone]                   [facility]: [phone]

## 2018-10-25 NOTE — Progress Notes (Signed)
   TELEHEALTH VIRTUAL OBSTETRICS VISIT ENCOUNTER NOTE Patient name: Gloria Lyons MRN [id number]  Date of birth: 19-Dec-1990  I connected with patient on 10/26/18 at  4:00 PM EDT by [facility] and verified that I am speaking with the correct person using two identifiers. Due to COVID-19 recommendations, pt is not currently in our office.    I discussed the limitations, risks, security and privacy concerns of performing an evaluation and management service by telephone and the availability of in person appointments. I also discussed with the patient that there may be a patient responsible charge related to this service. The patient expressed understanding and agreed to proceed.  Chief Complaint:   Routine Prenatal Visit  History of Present Illness:   Gloria Lyons is a 28 y.o. G2P1001 female at [redacted]w[redacted]d with an Estimated Date of Delivery: 02/13/19 being evaluated today for ongoing management of a low-risk pregnancy.  Today she reports no complaints. Contractions: Not present. Vag. Bleeding: None.  Movement: Present. denies leaking of fluid. Review of Systems:   Pertinent items are noted in HPI Denies abnormal vaginal discharge w/ itching/odor/irritation, headaches, visual changes, shortness of breath, chest pain, abdominal pain, severe nausea/vomiting, or problems with urination or bowel movements unless otherwise stated above. Pertinent History Reviewed:  Reviewed past medical,surgical, social, obstetrical and family history.  Reviewed problem list, medications and allergies. Physical Assessment:   Vitals:   10/25/18 1640  BP: 121/76  Pulse: 95  Weight: 224 lb 9.6 oz (101.9 kg)  Body mass index is 37.38 kg/m.        Physical Examination:   General:  Alert, oriented and cooperative.   Mental Status: Normal mood and affect perceived. Normal judgment and thought content.  Rest of physical exam deferred due to type of encounter  No results found for this or any previous visit (from the past 24  hour(s)).  Assessment & Plan:  1) Pregnancy G2P1001 at [redacted]w[redacted]d with an Estimated Date of Delivery: 02/13/19   2) H/O pre-e, continue ASA  3) Hypothyroidism> no meds, recheck TSH w/ PN2   Labs/procedures today: none  Plan:  Continue routine obstetrical care. Check bp weekly, let us know if >140/90.   Reviewed: Preterm labor symptoms and general obstetric precautions including but not limited to vaginal bleeding, contractions, leaking of fluid and fetal movement were reviewed in detail with the patient. The patient was advised to call back or seek an in-person office evaluation/go to [facility] at [facility] for any urgent or concerning symptoms. All questions were answered. Please refer to After Visit Summary for other counseling recommendations.   I provided 15 minutes of non-face-to-face time during this encounter.  Follow-up: Return in about 4 weeks (around 11/22/2018) for LROB, PN2, TSH.  No orders of the defined types were placed in this encounter.  Cheral Marker CNM, [facility] 10/25/2018

## 2018-11-21 ENCOUNTER — Encounter: Payer: Self-pay | Admitting: *Deleted

## 2018-11-22 ENCOUNTER — Other Ambulatory Visit: Payer: Federal, State, Local not specified - PPO

## 2018-11-22 ENCOUNTER — Ambulatory Visit (INDEPENDENT_AMBULATORY_CARE_PROVIDER_SITE_OTHER): Payer: Federal, State, Local not specified - PPO | Admitting: Women's Health

## 2018-11-22 ENCOUNTER — Encounter: Payer: Self-pay | Admitting: Women's Health

## 2018-11-22 ENCOUNTER — Other Ambulatory Visit: Payer: Self-pay

## 2018-11-22 VITALS — BP 125/76 | HR 103 | Wt 225.4 lb

## 2018-11-22 DIAGNOSIS — Z3483 Encounter for supervision of other normal pregnancy, third trimester: Secondary | ICD-10-CM

## 2018-11-22 DIAGNOSIS — E039 Hypothyroidism, unspecified: Secondary | ICD-10-CM

## 2018-11-22 DIAGNOSIS — O99283 Endocrine, nutritional and metabolic diseases complicating pregnancy, third trimester: Secondary | ICD-10-CM

## 2018-11-22 DIAGNOSIS — Z1389 Encounter for screening for other disorder: Secondary | ICD-10-CM

## 2018-11-22 DIAGNOSIS — Z3A28 28 weeks gestation of pregnancy: Secondary | ICD-10-CM

## 2018-11-22 DIAGNOSIS — Z331 Pregnant state, incidental: Secondary | ICD-10-CM

## 2018-11-22 DIAGNOSIS — Z23 Encounter for immunization: Secondary | ICD-10-CM | POA: Diagnosis not present

## 2018-11-22 LAB — POCT URINALYSIS DIPSTICK OB
Blood, UA: NEGATIVE
Glucose, UA: NEGATIVE
Ketones, UA: NEGATIVE
Leukocytes, UA: NEGATIVE
Nitrite, UA: NEGATIVE

## 2018-11-22 NOTE — Patient Instructions (Signed)
Gloria Lyons, I greatly value your feedback.  If you receive a survey following your visit with [place] today, we appreciate you taking the time to fill it out.  Thanks, [name], CNM, [facility]  [place]!!! It is now [place] at [facility] ([place], [place] [address]) Entrance located off of [place] parking   Home Blood Pressure Monitoring for Patients   Your provider has recommended that you check your blood pressure (BP) at least once a week at home. If you do not have a blood pressure cuff at home, one will be provided for you. Contact your provider if you have not received your monitor within 1 week.   Helpful Tips for Accurate Home Blood Pressure Checks  . Don't smoke, exercise, or drink caffeine 30 minutes before checking your BP . Use the restroom before checking your BP (a full bladder can raise your pressure) . Relax in a comfortable upright chair . Feet on the ground . Left arm resting comfortably on a flat surface at the level of your heart . Legs uncrossed . Back supported . Sit quietly and don't talk . Place the cuff on your bare arm . Adjust snuggly, so that only two fingertips can fit between your skin and the top of the cuff . Check 2 readings separated by at least one minute . Keep a log of your BP readings . For a visual, please reference this diagram: http://ccnc.care/bpdiagram  Provider Name: [facility]     Phone: [phone]  Zone 1: ALL CLEAR  Continue to monitor your symptoms:  . BP reading is less than 140 (top number) or less than 90 (bottom number)  . No right upper stomach pain . No headaches or seeing spots . No feeling nauseated or throwing up . No swelling in face and hands  Zone 2: CAUTION Call your doctor's office for any of the following:  . BP reading is greater than 140 (top number) or greater than 90 (bottom number)  . Stomach pain under your ribs in the middle  or right side . Headaches or seeing spots . Feeling nauseated or throwing up . Swelling in face and hands  Zone 3: EMERGENCY  Seek immediate medical care if you have any of the following:  . BP reading is greater than160 (top number) or greater than 110 (bottom number) . Severe headaches not improving with Tylenol . Serious difficulty catching your breath . Any worsening symptoms from Zone 2     Call the office [phone]) or go to [facility] if:  You begin to have strong, frequent contractions  Your water breaks.  Sometimes it is a big gush of fluid, sometimes it is just a trickle that keeps getting your panties wet or running down your legs  You have vaginal bleeding.  It is normal to have a small amount of spotting if your cervix was checked.   You don't feel your baby moving like normal.  If you don't, get you something to eat and drink and lay down and focus on feeling your baby move.  You should feel at least 10 movements in 2 hours.  If you don't, you should call the office or go to [facility].    Tdap Vaccine  It is recommended that you get the Tdap vaccine during the third trimester of EACH pregnancy to help protect your baby from getting pertussis (whooping cough)  27-36 weeks  is the BEST time to do this so that you can pass the protection on to your baby. During pregnancy is better than after pregnancy, but if you are unable to get it during pregnancy it will be offered at the hospital.   You can get this vaccine with [place], at the health department, your family doctor, or some local pharmacies  Everyone who will be around your baby should also be up-to-date on their vaccines before the baby comes. Adults (who are not pregnant) only need 1 dose of Tdap during adulthood.   Third Trimester of Pregnancy The third trimester is from week 29 through week 42, months 7 through 9. The third trimester is a time when the fetus is growing rapidly. At the end of the ninth  month, the fetus is about 20 inches in length and weighs 6-10 pounds.  BODY CHANGES Your body goes through many changes during pregnancy. The changes vary from woman to woman.   Your weight will continue to increase. You can expect to gain 25-35 pounds (11-16 kg) by the end of the pregnancy.  You may begin to get stretch marks on your hips, abdomen, and breasts.  You may urinate more often because the fetus is moving lower into your pelvis and pressing on your bladder.  You may develop or continue to have heartburn as a result of your pregnancy.  You may develop constipation because certain hormones are causing the muscles that push waste through your intestines to slow down.  You may develop hemorrhoids or swollen, bulging veins (varicose veins).  You may have pelvic pain because of the weight gain and pregnancy hormones relaxing your joints between the bones in your pelvis. Backaches may result from overexertion of the muscles supporting your posture.  You may have changes in your hair. These can include thickening of your hair, rapid growth, and changes in texture. Some women also have hair loss during or after pregnancy, or hair that feels dry or thin. Your hair will most likely return to normal after your baby is born.  Your breasts will continue to grow and be tender. A yellow discharge may leak from your breasts called colostrum.  Your belly button may stick out.  You may feel short of breath because of your expanding uterus.  You may notice the fetus "dropping," or moving lower in your abdomen.  You may have a bloody mucus discharge. This usually occurs a few days to a week before labor begins.  Your cervix becomes thin and soft (effaced) near your due date. WHAT TO EXPECT AT YOUR PRENATAL EXAMS  You will have prenatal exams every 2 weeks until week 36. Then, you will have weekly prenatal exams. During a routine prenatal visit:  You will be weighed to make sure you and the  fetus are growing normally.  Your blood pressure is taken.  Your abdomen will be measured to track your baby's growth.  The fetal heartbeat will be listened to.  Any test results from the previous visit will be discussed.  You may have a cervical check near your due date to see if you have effaced. At around 36 weeks, your caregiver will check your cervix. At the same time, your caregiver will also perform a test on the secretions of the vaginal tissue. This test is to determine if a type of bacteria, Group B streptococcus, is present. Your caregiver will explain this further. Your caregiver may ask you:  What your birth plan is.  How you  are feeling.  If you are feeling the baby move.  If you have had any abnormal symptoms, such as leaking fluid, bleeding, severe headaches, or abdominal cramping.  If you have any questions. Other tests or screenings that may be performed during your third trimester include:  Blood tests that check for low iron levels (anemia).  Fetal testing to check the health, activity level, and growth of the fetus. Testing is done if you have certain medical conditions or if there are problems during the pregnancy. FALSE LABOR You may feel small, irregular contractions that eventually go away. These are called Braxton Hicks contractions, or false labor. Contractions may last for hours, days, or even weeks before true labor sets in. If contractions come at regular intervals, intensify, or become painful, it is best to be seen by your caregiver.  SIGNS OF LABOR   Menstrual-like cramps.  Contractions that are 5 minutes apart or less.  Contractions that start on the top of the uterus and spread down to the lower abdomen and back.  A sense of increased pelvic pressure or back pain.  A watery or bloody mucus discharge that comes from the vagina. If you have any of these signs before the 37th week of pregnancy, call your caregiver right away. You need to go to  the hospital to get checked immediately. HOME CARE INSTRUCTIONS   Avoid all smoking, herbs, alcohol, and unprescribed drugs. These chemicals affect the formation and growth of the baby.  Follow your caregiver's instructions regarding medicine use. There are medicines that are either safe or unsafe to take during pregnancy.  Exercise only as directed by your caregiver. Experiencing uterine cramps is a good sign to stop exercising.  Continue to eat regular, healthy meals.  Wear a good support bra for breast tenderness.  Do not use hot tubs, steam rooms, or saunas.  Wear your seat belt at all times when driving.  Avoid raw meat, uncooked cheese, cat litter boxes, and soil used by cats. These carry germs that can cause birth defects in the baby.  Take your prenatal vitamins.  Try taking a stool softener (if your caregiver approves) if you develop constipation. Eat more high-fiber foods, such as fresh vegetables or fruit and whole grains. Drink plenty of fluids to keep your urine clear or pale yellow.  Take warm sitz baths to soothe any pain or discomfort caused by hemorrhoids. Use hemorrhoid cream if your caregiver approves.  If you develop varicose veins, wear support hose. Elevate your feet for 15 minutes, 3-4 times a day. Limit salt in your diet.  Avoid heavy lifting, wear low heal shoes, and practice good posture.  Rest a lot with your legs elevated if you have leg cramps or low back pain.  Visit your dentist if you have not gone during your pregnancy. Use a soft toothbrush to brush your teeth and be gentle when you floss.  A sexual relationship may be continued unless your caregiver directs you otherwise.  Do not travel far distances unless it is absolutely necessary and only with the approval of your caregiver.  Take prenatal classes to understand, practice, and ask questions about the labor and delivery.  Make a trial run to the hospital.  Pack your hospital  bag.  Prepare the baby's nursery.  Continue to go to all your prenatal visits as directed by your caregiver. SEEK MEDICAL CARE IF:  You are unsure if you are in labor or if your water has broken.  You have dizziness.  You have mild pelvic cramps, pelvic pressure, or nagging pain in your abdominal area.  You have persistent nausea, vomiting, or diarrhea.  You have a bad smelling vaginal discharge.  You have pain with urination. SEEK IMMEDIATE MEDICAL CARE IF:   You have a fever.  You are leaking fluid from your vagina.  You have spotting or bleeding from your vagina.  You have severe abdominal cramping or pain.  You have rapid weight loss or gain.  You have shortness of breath with chest pain.  You notice sudden or extreme swelling of your face, hands, ankles, feet, or legs.  You have not felt your baby move in over an hour.  You have severe headaches that do not go away with medicine.  You have vision changes. Document Released: 05/24/2001 Document Revised: 06/04/2013 Document Reviewed: 07/31/2012 [facility] Patient Information 2015 [place], [place]. This information is not intended to replace advice given to you by your health care provider. Make sure you discuss any questions you have with your health care provider.

## 2018-11-22 NOTE — Progress Notes (Signed)
   LOW-RISK PREGNANCY VISIT Patient name: Gloria Lyons MRN [id number]  Date of birth: December 06, 1990 Chief Complaint:   Routine Prenatal Visit  History of Present Illness:   Gloria Lyons is a 28 y.o. G2P1001 female at [redacted]w[redacted]d with an Estimated Date of Delivery: 02/13/19 being seen today for ongoing management of a low-risk pregnancy.  Today she reports no complaints. Contractions: Not present.  .  Movement: Present. denies leaking of fluid. Review of Systems:   Pertinent items are noted in HPI Denies abnormal vaginal discharge w/ itching/odor/irritation, headaches, visual changes, shortness of breath, chest pain, abdominal pain, severe nausea/vomiting, or problems with urination or bowel movements unless otherwise stated above. Pertinent History Reviewed:  Reviewed past medical,surgical, social, obstetrical and family history.  Reviewed problem list, medications and allergies. Physical Assessment:   Vitals:   11/22/18 0945  BP: 125/76  Pulse: (!) 103  Weight: 225 lb 6.4 oz (102.2 kg)  Body mass index is 37.51 kg/m.        Physical Examination:   General appearance: Well appearing, and in no distress  Mental status: Alert, oriented to person, place, and time  Skin: Warm & dry  Cardiovascular: Normal heart rate noted  Respiratory: Normal respiratory effort, no distress  Abdomen: Soft, gravid, nontender  Pelvic: Cervical exam deferred         Extremities: Edema: Trace  Fetal Status: Fetal Heart Rate (bpm): 147 Fundal Height: 28 cm Movement: Present    Results for orders placed or performed in visit on 11/22/18 (from the past 24 hour(s))  POC Urinalysis Dipstick OB   Collection Time: 11/22/18  9:50 AM  Result Value Ref Range   Color, UA     Clarity, UA     Glucose, UA Negative Negative   Bilirubin, UA     Ketones, UA neg    Spec Grav, UA     Blood, UA neg    pH, UA     POC,PROTEIN,UA Trace Negative, Trace, Small (1+), Moderate (2+), Large (3+), 4+   Urobilinogen, UA     Nitrite, UA neg    Leukocytes, UA Negative Negative   Appearance     Odor      Assessment & Plan:  1) Low-risk pregnancy G2P1001 at [redacted]w[redacted]d with an Estimated Date of Delivery: 02/13/19   2) H/O pre-e, continue ASA  3) Hypothyroidism> no meds, recheck today   Meds: No orders of the defined types were placed in this encounter.  Labs/procedures today: pn2, tdap  Plan:  Continue routine obstetrical care   Reviewed: Preterm labor symptoms and general obstetric precautions including but not limited to vaginal bleeding, contractions, leaking of fluid and fetal movement were reviewed in detail with the patient.  All questions were answered  Follow-up: Return in about 4 weeks (around 12/20/2018) for LROB webex.  Orders Placed This Encounter  Procedures  . Tdap vaccine greater than or equal to 7yo IM  . TSH  . POC Urinalysis Dipstick OB   Roma Schanz CNM, [facility] 11/22/2018 10:26 AM

## 2018-11-23 LAB — GLUCOSE TOLERANCE, 2 HOURS W/ 1HR
Glucose, 1 hour: 163 mg/dL (ref 65–179)
Glucose, 2 hour: 85 mg/dL (ref 65–152)
Glucose, Fasting: 81 mg/dL (ref 65–91)

## 2018-11-23 LAB — CBC
Hematocrit: 33.3 % — ABNORMAL LOW (ref 34.0–46.6)
Hemoglobin: 11.5 g/dL (ref 11.1–15.9)
MCH: 28.9 pg (ref 26.6–33.0)
MCHC: 34.5 g/dL (ref 31.5–35.7)
MCV: 84 fL (ref 79–97)
Platelets: 182 10*3/uL (ref 150–450)
RBC: 3.98 x10E6/uL (ref 3.77–5.28)
RDW: 12 % (ref 11.7–15.4)
WBC: 10.6 10*3/uL (ref 3.4–10.8)

## 2018-11-23 LAB — ANTIBODY SCREEN: Antibody Screen: NEGATIVE

## 2018-11-23 LAB — HIV ANTIBODY (ROUTINE TESTING W REFLEX): HIV Screen 4th Generation wRfx: NONREACTIVE

## 2018-11-23 LAB — RPR: RPR Ser Ql: NONREACTIVE

## 2018-11-23 LAB — TSH: TSH: 1.33 u[IU]/mL (ref 0.450–4.500)

## 2018-12-20 ENCOUNTER — Other Ambulatory Visit: Payer: Self-pay

## 2018-12-20 ENCOUNTER — Encounter: Payer: Self-pay | Admitting: Advanced Practice Midwife

## 2018-12-20 ENCOUNTER — Ambulatory Visit (INDEPENDENT_AMBULATORY_CARE_PROVIDER_SITE_OTHER): Payer: Federal, State, Local not specified - PPO | Admitting: Advanced Practice Midwife

## 2018-12-20 VITALS — BP 132/76 | HR 90 | Wt 231.4 lb

## 2018-12-20 DIAGNOSIS — Z3483 Encounter for supervision of other normal pregnancy, third trimester: Secondary | ICD-10-CM

## 2018-12-20 DIAGNOSIS — Z3A32 32 weeks gestation of pregnancy: Secondary | ICD-10-CM

## 2018-12-20 NOTE — Patient Instructions (Addendum)
Gloria Lyons, I greatly value your feedback.  If you receive a survey following your visit with [place] today, we appreciate you taking the time to fill it out.  Thanks, Nigel Berthold, CNM   Call the office [phone]) or go to [facility] if:  You begin to have strong, frequent contractions  Your water breaks.  Sometimes it is a big gush of fluid, sometimes it is just a trickle that keeps getting your panties wet or running down your legs  You have vaginal bleeding.  It is normal to have a small amount of spotting if your cervix was checked.   You don't feel your baby moving like normal.  If you don't, get you something to eat and drink and lay down and focus on feeling your baby move.  You should feel at least 10 movements in 2 hours.  If you don't, you should call the office or go to [facility].    Tdap Vaccine  It is recommended that you get the Tdap vaccine during the third trimester of EACH pregnancy to help protect your baby from getting pertussis (whooping cough)  27-36 weeks is the BEST time to do this so that you can pass the protection on to your baby. During pregnancy is better than after pregnancy, but if you are unable to get it during pregnancy it will be offered at the hospital.   You will be offered this vaccine in the office after 27 weeks. If you do not have health insurance, you can get this vaccine at the health department or your family doctor  Everyone who will be around your baby should also be up-to-date on their vaccines. Adults (who are not pregnant) only need 1 dose of Tdap during adulthood.   Third Trimester of Pregnancy The third trimester is from week 29 through week 42, months 7 through 9. The third trimester is a time when the fetus is growing rapidly. At the end of the ninth month, the fetus is about 20 inches in length and weighs 6-10 pounds.  BODY CHANGES Your body goes through many changes during pregnancy. The changes vary from woman to  woman.   Your weight will continue to increase. You can expect to gain 25-35 pounds (11-16 kg) by the end of the pregnancy.  You may begin to get stretch marks on your hips, abdomen, and breasts.  You may urinate more often because the fetus is moving lower into your pelvis and pressing on your bladder.  You may develop or continue to have heartburn as a result of your pregnancy.  You may develop constipation because certain hormones are causing the muscles that push waste through your intestines to slow down.  You may develop hemorrhoids or swollen, bulging veins (varicose veins).  You may have pelvic pain because of the weight gain and pregnancy hormones relaxing your joints between the bones in your pelvis. Backaches may result from overexertion of the muscles supporting your posture.  You may have changes in your hair. These can include thickening of your hair, rapid growth, and changes in texture. Some women also have hair loss during or after pregnancy, or hair that feels dry or thin. Your hair will most likely return to normal after your baby is born.  Your breasts will continue to grow and be tender. A yellow discharge may leak from your breasts called colostrum.  Your belly button may stick out.  You may feel short of breath because of your expanding uterus.  You may notice the fetus "dropping," or moving lower in your abdomen.  You may have a bloody mucus discharge. This usually occurs a few days to a week before labor begins.  Your cervix becomes thin and soft (effaced) near your due date. WHAT TO EXPECT AT YOUR PRENATAL EXAMS  You will have prenatal exams every 2 weeks until week 36. Then, you will have weekly prenatal exams. During a routine prenatal visit:  You will be weighed to make sure you and the fetus are growing normally.  Your blood pressure is taken.  Your abdomen will be measured to track your baby's growth.  The fetal heartbeat will be listened  to.  Any test results from the previous visit will be discussed.  You may have a cervical check near your due date to see if you have effaced. At around 36 weeks, your caregiver will check your cervix. At the same time, your caregiver will also perform a test on the secretions of the vaginal tissue. This test is to determine if a type of bacteria, Group B streptococcus, is present. Your caregiver will explain this further. Your caregiver may ask you:  What your birth plan is.  How you are feeling.  If you are feeling the baby move.  If you have had any abnormal symptoms, such as leaking fluid, bleeding, severe headaches, or abdominal cramping.  If you have any questions. Other tests or screenings that may be performed during your third trimester include:  Blood tests that check for low iron levels (anemia).  Fetal testing to check the health, activity level, and growth of the fetus. Testing is done if you have certain medical conditions or if there are problems during the pregnancy. FALSE LABOR You may feel small, irregular contractions that eventually go away. These are called Braxton Hicks contractions, or false labor. Contractions may last for hours, days, or even weeks before true labor sets in. If contractions come at regular intervals, intensify, or become painful, it is best to be seen by your caregiver.  SIGNS OF LABOR   Menstrual-like cramps.  Contractions that are 5 minutes apart or less.  Contractions that start on the top of the uterus and spread down to the lower abdomen and back.  A sense of increased pelvic pressure or back pain.  A watery or bloody mucus discharge that comes from the vagina. If you have any of these signs before the 37th week of pregnancy, call your caregiver right away. You need to go to the hospital to get checked immediately. HOME CARE INSTRUCTIONS   Avoid all smoking, herbs, alcohol, and unprescribed drugs. These chemicals affect the  formation and growth of the baby.  Follow your caregiver's instructions regarding medicine use. There are medicines that are either safe or unsafe to take during pregnancy.  Exercise only as directed by your caregiver. Experiencing uterine cramps is a good sign to stop exercising.  Continue to eat regular, healthy meals.  Wear a good support bra for breast tenderness.  Do not use hot tubs, steam rooms, or saunas.  Wear your seat belt at all times when driving.  Avoid raw meat, uncooked cheese, cat litter boxes, and soil used by cats. These carry germs that can cause birth defects in the baby.  Take your prenatal vitamins.  Try taking a stool softener (if your caregiver approves) if you develop constipation. Eat more high-fiber foods, such as fresh vegetables or fruit and whole grains. Drink plenty of fluids to keep your urine  clear or pale yellow.  Take warm sitz baths to soothe any pain or discomfort caused by hemorrhoids. Use hemorrhoid cream if your caregiver approves.  If you develop varicose veins, wear support hose. Elevate your feet for 15 minutes, 3-4 times a day. Limit salt in your diet.  Avoid heavy lifting, wear low heal shoes, and practice good posture.  Rest a lot with your legs elevated if you have leg cramps or low back pain.  Visit your dentist if you have not gone during your pregnancy. Use a soft toothbrush to brush your teeth and be gentle when you floss.  A sexual relationship may be continued unless your caregiver directs you otherwise.  Do not travel far distances unless it is absolutely necessary and only with the approval of your caregiver.  Take prenatal classes to understand, practice, and ask questions about the labor and delivery.  Make a trial run to the hospital.  Pack your hospital bag.  Prepare the baby's nursery.  Continue to go to all your prenatal visits as directed by your caregiver. SEEK MEDICAL CARE IF:  You are unsure if you are in  labor or if your water has broken.  You have dizziness.  You have mild pelvic cramps, pelvic pressure, or nagging pain in your abdominal area.  You have persistent nausea, vomiting, or diarrhea.  You have a bad smelling vaginal discharge.  You have pain with urination. SEEK IMMEDIATE MEDICAL CARE IF:   You have a fever.  You are leaking fluid from your vagina.  You have spotting or bleeding from your vagina.  You have severe abdominal cramping or pain.  You have rapid weight loss or gain.  You have shortness of breath with chest pain.  You notice sudden or extreme swelling of your face, hands, ankles, feet, or legs.  You have not felt your baby move in over an hour.  You have severe headaches that do not go away with medicine.  You have vision changes. Document Released: 05/24/2001 Document Revised: 06/04/2013 Document Reviewed: 07/31/2012 [facility] Patient Information 2015 [place]. This information is not intended to replace advice given to you by your health care provider. Make sure you discuss any questions you have with your health care provider.   Kinesiology taping for pregnancy:  Youtube has good vidoes of "how tos" for lower back, pelvic, hip pain; swelling of feet, etc

## 2018-12-20 NOTE — Progress Notes (Signed)
   TELEHEALTH VIRTUAL OBSTETRICS VISIT ENCOUNTER NOTE  I connected with Gloria Lyons on 12/20/18 at  3:00 PM EDT by telephone at home and verified that I am speaking with the correct person using two identifiers.   I discussed the limitations, risks, security and privacy concerns of performing an evaluation and management service by telephone and the availability of in person appointments. I also discussed with the patient that there may be a patient responsible charge related to this service. The patient expressed understanding and agreed to proceed.  Subjective:  Gloria Lyons is a 28 y.o. G2P1001 at [redacted]w[redacted]d being followed for ongoing prenatal care.  She is currently monitored for the following issues for this low-risk pregnancy and has Hypothyroidism; Hx of preeclampsia, prior pregnancy, currently pregnant; and Supervision of normal pregnancy on their problem list.  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 . Reports fetal movement. Denies any contractions, bleeding or leaking of fluid.   The following portions of the patient's history were reviewed and updated as appropriate: allergies, current medications, past family history, past medical history, past social history, past surgical history and problem list.   Objective:   General:  Alert, oriented and cooperative.   Mental Status: Normal mood and affect perceived. Normal judgment and thought content.  Rest of physical exam deferred due to type of encounter  Assessment and Plan:  Pregnancy: G2P1001 at [redacted]w[redacted]d There are no diagnoses linked to this encounter. Preterm labor symptoms and general obstetric precautions including but not limited to vaginal bleeding, contractions, leaking of fluid and fetal movement were reviewed in detail with the patient.  I discussed the assessment and treatment plan with  the patient. The patient was provided an opportunity to ask questions and all were answered. The patient agreed with the plan and demonstrated an understanding of the instructions. The patient was advised to call back or seek an in-person office evaluation/go to [facility] at [facility] for any urgent or concerning symptoms. Please refer to After Visit Summary for other counseling recommendations.   I provided 12 minutes of non-face-to-face time during this encounter.  Return for Gloria Lyons.(face to face per request  Future Appointments  Date Time Provider [place]  01/03/2019  4:00 PM [name], [place], CNM [facility]    Christin Fudge, [place] for [employer], [place]

## 2019-01-03 ENCOUNTER — Ambulatory Visit (INDEPENDENT_AMBULATORY_CARE_PROVIDER_SITE_OTHER): Payer: Federal, State, Local not specified - PPO | Admitting: Advanced Practice Midwife

## 2019-01-03 ENCOUNTER — Other Ambulatory Visit: Payer: Self-pay

## 2019-01-03 VITALS — BP 129/82 | HR 72 | Wt 232.0 lb

## 2019-01-03 DIAGNOSIS — Z3A34 34 weeks gestation of pregnancy: Secondary | ICD-10-CM

## 2019-01-03 DIAGNOSIS — O26843 Uterine size-date discrepancy, third trimester: Secondary | ICD-10-CM

## 2019-01-03 DIAGNOSIS — Z3483 Encounter for supervision of other normal pregnancy, third trimester: Secondary | ICD-10-CM

## 2019-01-03 DIAGNOSIS — Z331 Pregnant state, incidental: Secondary | ICD-10-CM

## 2019-01-03 DIAGNOSIS — Z1389 Encounter for screening for other disorder: Secondary | ICD-10-CM

## 2019-01-03 LAB — POCT URINALYSIS DIPSTICK OB
Blood, UA: NEGATIVE
Glucose, UA: NEGATIVE
Ketones, UA: NEGATIVE
Leukocytes, UA: NEGATIVE
Nitrite, UA: NEGATIVE
POC,PROTEIN,UA: NEGATIVE

## 2019-01-03 NOTE — Progress Notes (Signed)
LOW-RISK PREGNANCY VISIT Patient name: Gloria Lyons MRN [id number]  Date of birth: 10/31/1990 Chief Complaint:   Routine Prenatal Visit  History of Present Illness:   Gloria Lyons is a 28 y.o. G2P1001 female at [redacted]w[redacted]d with an Estimated Date of Delivery: 02/13/19 being seen today for ongoing management of a low-risk pregnancy.  Today she reports no complaints. Contractions: Not present. Vag. Bleeding: None.  Movement: Present. denies leaking of fluid. Review of Systems:   Pertinent items are noted in HPI Denies abnormal vaginal discharge w/ itching/odor/irritation, headaches, visual changes, shortness of breath, chest pain, abdominal pain, severe nausea/vomiting, or problems with urination or bowel movements unless otherwise stated above.  Pertinent History Reviewed:  Medical & Surgical Hx:   Past Medical History:  Diagnosis Date  . Hypothyroidism    stated several years ago and does not take meds for it   Past Surgical History:  Procedure Laterality Date  . NO PAST SURGERIES     Family History  Problem Relation Age of Onset  . Cancer Paternal Grandfather   . Diabetes Paternal Grandfather   . Cancer Maternal Grandmother   . Diabetes Maternal Grandmother   . Diabetes Maternal Grandfather   . Leukemia Father   . Hypertension Father   . Heart attack Mother   . Seizures Mother   . Hypertension Mother   . Hypertension Paternal Aunt   . Hypertension Paternal Aunt     Current Outpatient Medications:  .  aspirin EC 81 MG tablet, Take 162 mg by mouth daily., Disp: , Rfl:  .  Blood Pressure Monitor MISC, For regular home bp monitoring during pregnancy, Disp: 1 each, Rfl: 0 .  prenatal vitamin w/FE, FA (PRENATAL 1 + 1) 27-1 MG TABS tablet, Take 1 tablet by mouth daily at 12 noon., Disp: 30 each, Rfl: 12 Social History: Reviewed -  reports that she is a non-smoker but has been exposed to tobacco smoke. She has never used smokeless tobacco.  Physical Assessment:   Vitals:   01/03/19 1552  BP: 129/82  Pulse: 72  Weight: 232 lb (105.2 kg)  Body mass index is 38.61 kg/m.        Physical Examination:   General appearance: Well appearing, and in no distress  Mental status: Alert, oriented to person, place, and time  Skin: Warm & dry  Cardiovascular: Normal heart rate noted  Respiratory: Normal respiratory effort, no distress  Abdomen: Soft, gravid, nontender  Pelvic: Cervical exam deferred         Extremities: Edema: Trace  Fetal Status:     Movement: Present    Results for orders placed or performed in visit on 01/03/19 (from the past 24 hour(s))  POC Urinalysis Dipstick OB   Collection Time: 01/03/19  3:54 PM  Result Value Ref Range   Color, UA     Clarity, UA     Glucose, UA Negative Negative   Bilirubin, UA     Ketones, UA neg    Spec Grav, UA     Blood, UA neg    pH, UA     POC,PROTEIN,UA Negative Negative, Trace, Small (1+), Moderate (2+), Large (3+), 4+   Urobilinogen, UA     Nitrite, UA neg    Leukocytes, UA Negative Negative   Appearance     Odor      Assessment & Plan:  1) Low-risk pregnancy G2P1001 at [redacted]w[redacted]d with an Estimated Date of Delivery: 02/13/19   2) size<dates,    Labs/procedures/US today:  Plan:  Continue routine obstetrical care check EFW/AFI    Follow-up: Return for asap for EFW/AFI/LROB.  Orders Placed This Encounter  Procedures  . US OB Follow Up  . POC Urinalysis Dipstick OB   Jacklyn ShellFrances [name] [facility] 01/03/2019 4:23 PM

## 2019-01-03 NOTE — Addendum Note (Signed)
Addended by: Christin Fudge on: 01/03/2019 04:29 PM   Modules accepted: Orders

## 2019-01-07 ENCOUNTER — Telehealth: Payer: Self-pay | Admitting: Advanced Practice Midwife

## 2019-01-07 NOTE — Telephone Encounter (Signed)
Pt having Braxton Hicks contractions off and on, at work. Pt works at [employer]. Has happened some at night. Has noticed it 2 weeks. + baby movement. No bleeding and no leaking of fluid. Advised to keep herself well hydrated. If she notices decreased baby movement, bleeding or leaking fluid, call office or go to [facility] for eval. Pt voiced understanding. [place]

## 2019-01-07 NOTE — Telephone Encounter (Signed)
Pt is almost 35 weeks preg having braxton hicks would like to talk to a nurse

## 2019-01-11 ENCOUNTER — Ambulatory Visit (HOSPITAL_COMMUNITY)
Admission: RE | Admit: 2019-01-11 | Discharge: 2019-01-11 | Disposition: A | Discharge status: Home / Self Care | Payer: Federal, State, Local not specified - PPO | Source: Ambulatory Visit | Attending: Advanced Practice Midwife | Admitting: Advanced Practice Midwife

## 2019-01-11 ENCOUNTER — Other Ambulatory Visit: Payer: Self-pay

## 2019-01-11 ENCOUNTER — Other Ambulatory Visit: Payer: Self-pay | Admitting: Advanced Practice Midwife

## 2019-01-11 DIAGNOSIS — O99213 Obesity complicating pregnancy, third trimester: Secondary | ICD-10-CM

## 2019-01-11 DIAGNOSIS — O26843 Uterine size-date discrepancy, third trimester: Secondary | ICD-10-CM | POA: Diagnosis not present

## 2019-01-11 DIAGNOSIS — O09293 Supervision of pregnancy with other poor reproductive or obstetric history, third trimester: Secondary | ICD-10-CM | POA: Diagnosis not present

## 2019-01-11 DIAGNOSIS — Z3A35 35 weeks gestation of pregnancy: Secondary | ICD-10-CM

## 2019-01-11 DIAGNOSIS — Z363 Encounter for antenatal screening for malformations: Secondary | ICD-10-CM

## 2019-01-14 ENCOUNTER — Encounter: Payer: Self-pay | Admitting: *Deleted

## 2019-01-15 ENCOUNTER — Telehealth: Payer: Self-pay | Admitting: Women's Health

## 2019-01-15 NOTE — Telephone Encounter (Signed)
Patient called, concerned about bp.  Current reading is 134/86, highest today was 152/90.    [phone]

## 2019-01-15 NOTE — Telephone Encounter (Signed)
Patient states she was at work and just started "feeling weird" so she went home.  Took her BP which was 152/90.  She then retook her blood pressure an hour later and it was 134/86. Denies vision changes, headaches or right upper gastric pain. Advised patient to continue to monitor BP since she is scheduled for Thursday and if BP is 150/100's or if she developed symptoms,l  to call our office or [facility]. Pt verbalized understanding.

## 2019-01-17 ENCOUNTER — Ambulatory Visit (INDEPENDENT_AMBULATORY_CARE_PROVIDER_SITE_OTHER): Payer: Federal, State, Local not specified - PPO | Admitting: Women's Health

## 2019-01-17 ENCOUNTER — Other Ambulatory Visit: Payer: Self-pay

## 2019-01-17 ENCOUNTER — Encounter: Payer: Self-pay | Admitting: Women's Health

## 2019-01-17 VITALS — BP 128/86 | HR 98 | Wt 238.4 lb

## 2019-01-17 DIAGNOSIS — Z1389 Encounter for screening for other disorder: Secondary | ICD-10-CM

## 2019-01-17 DIAGNOSIS — Z3A36 36 weeks gestation of pregnancy: Secondary | ICD-10-CM

## 2019-01-17 DIAGNOSIS — Z3483 Encounter for supervision of other normal pregnancy, third trimester: Secondary | ICD-10-CM | POA: Diagnosis not present

## 2019-01-17 DIAGNOSIS — Z331 Pregnant state, incidental: Secondary | ICD-10-CM

## 2019-01-17 LAB — POCT URINALYSIS DIPSTICK OB
Blood, UA: NEGATIVE
Glucose, UA: NEGATIVE
Ketones, UA: NEGATIVE
Leukocytes, UA: NEGATIVE
Nitrite, UA: NEGATIVE

## 2019-01-17 NOTE — Progress Notes (Signed)
   LOW-RISK PREGNANCY VISIT Patient name: Gloria Lyons MRN [id number]  Date of birth: 08/21/1990 Chief Complaint:   Routine Prenatal Visit (gbs-gc,chl)  History of Present Illness:   Gloria Lyons is a 28 y.o. G2P1001 female at [redacted]w[redacted]d with an Estimated Date of Delivery: 02/13/19 being seen today for ongoing management of a low-risk pregnancy.  Today she reports bp was 140/90 the other day, had mild headache, better now. Denies ha, visual changes, ruq/epigastric pain, n/v.   Contractions: Irregular.  .  Movement: Present. denies leaking of fluid. Review of Systems:   Pertinent items are noted in HPI Denies abnormal vaginal discharge w/ itching/odor/irritation, headaches, visual changes, shortness of breath, chest pain, abdominal pain, severe nausea/vomiting, or problems with urination or bowel movements unless otherwise stated above. Pertinent History Reviewed:  Reviewed past medical,surgical, social, obstetrical and family history.  Reviewed problem list, medications and allergies. Physical Assessment:   Vitals:   01/17/19 1021  BP: 128/86  Pulse: 98  Weight: 238 lb 6.4 oz (108.1 kg)  Body mass index is 39.67 kg/m.        Physical Examination:   General appearance: Well appearing, and in no distress  Mental status: Alert, oriented to person, place, and time  Skin: Warm & dry  Cardiovascular: Normal heart rate noted  Respiratory: Normal respiratory effort, no distress  Abdomen: Soft, gravid, nontender  Pelvic: Cervical exam performed  Dilation: Closed Effacement (%): Thick Station: -3  Extremities: Edema: Trace  Fetal Status: Fetal Heart Rate (bpm): 144 Fundal Height: 34 cm Movement: Present Presentation: Vertex  Results for orders placed or performed in visit on 01/17/19 (from the past 24 hour(s))  POC Urinalysis Dipstick OB   Collection Time: 01/17/19 10:27 AM  Result Value Ref Range   Color, UA     Clarity, UA     Glucose, UA Negative Negative   Bilirubin, UA     Ketones, UA neg    Spec Grav, UA     Blood, UA neg    pH, UA     POC,PROTEIN,UA Trace Negative, Trace, Small (1+), Moderate (2+), Large (3+), 4+   Urobilinogen, UA     Nitrite, UA neg    Leukocytes, UA Negative Negative   Appearance     Odor      Assessment & Plan:  1) Low-risk pregnancy G2P1001 at [redacted]w[redacted]d with an Estimated Date of Delivery: 02/13/19   2) H/O pre-e, bp elevated at home the other day, normal since, asymptomatic, check bp's at home, let us know if consistently >140/90, reviewed pre-e s/s, reasons to seek care   Meds: No orders of the defined types were placed in this encounter.  Labs/procedures today: gbs, gc/ct, sve  Plan:  Continue routine obstetrical care   Reviewed: Preterm labor symptoms and general obstetric precautions including but not limited to vaginal bleeding, contractions, leaking of fluid and fetal movement were reviewed in detail with the patient.  All questions were answered.   Follow-up: Return in about 1 week (around 01/24/2019) for [place], in person.  Orders Placed This Encounter  Procedures  . Strep Gp B NAA  . GC/Chlamydia Probe Amp  . POC Urinalysis Dipstick OB   Roma Schanz CNM, [facility] 01/17/2019 10:57 AM

## 2019-01-17 NOTE — Patient Instructions (Signed)
Gloria Lyons, I greatly value your feedback.  If you receive a survey following your visit with [place] today, we appreciate you taking the time to fill it out.  Thanks, Gloria Lyons, CNM, [name]  [place]!!! It is now [place] [facility] ([place],  [address]) Entrance located off of [place] parking   Go to [email or web address] to register for FREE online childbirth classes    Call the office [phone]) or go to [facility] if:  You begin to have strong, frequent contractions  Your water breaks.  Sometimes it is a big gush of fluid, sometimes it is just a trickle that keeps getting your panties wet or running down your legs  You have vaginal bleeding.  It is normal to have a small amount of spotting if your cervix was checked.   You don't feel your baby moving like normal.  If you don't, get you something to eat and drink and lay down and focus on feeling your baby move.  You should feel at least 10 movements in 2 hours.  If you don't, you should call the office or go to [place] Blood Pressure Monitoring for Patients   Your provider has recommended that you check your blood pressure (BP) at least once a week at home. If you do not have a blood pressure cuff at home, one will be provided for you. Contact your provider if you have not received your monitor within 1 week.   Helpful Tips for Accurate Home Blood Pressure Checks   Don't smoke, exercise, or drink caffeine 30 minutes before checking your BP  Use the restroom before checking your BP (a full bladder can raise your pressure)  Relax in a comfortable upright chair  Feet on the ground  Left arm resting comfortably on a flat surface at the level of your heart  Legs uncrossed  Back supported  Sit quietly and don't talk  Place the cuff on your bare arm  Adjust snuggly, so that only two fingertips can fit between your skin  and the top of the cuff  Check 2 readings separated by at least one minute  Keep a log of your BP readings  For a visual, please reference this diagram: http://ccnc.care/bpdiagram  Provider Name: Gloria Lyons OB/GYN     Phone: [phone]  Zone 1: ALL CLEAR  Continue to monitor your symptoms:   BP reading is less than 140 (top number) or less than 90 (bottom number)   No right upper stomach pain  No headaches or seeing spots  No feeling nauseated or throwing up  No swelling in face and hands  Zone 2: CAUTION Call your doctor's office for any of the following:   BP reading is greater than 140 (top number) or greater than 90 (bottom number)   Stomach pain under your ribs in the middle or right side  Headaches or seeing spots  Feeling nauseated or throwing up  Swelling in face and hands  Zone 3: EMERGENCY  Seek immediate medical care if you have any of the following:   BP reading is greater than160 (top number) or greater than 110 (bottom number)  Severe headaches not improving with Tylenol  Serious difficulty catching your breath  Any worsening symptoms from Zone 2    Braxton Hicks Contractions Contractions of the uterus can occur throughout pregnancy, but they are not always a sign that you are  in labor. You may have practice contractions called Braxton Hicks contractions. These false labor contractions are sometimes confused with true labor. What are Gloria Lyons contractions? Braxton Hicks contractions are tightening movements that occur in the muscles of the uterus before labor. Unlike true labor contractions, these contractions do not result in opening (dilation) and thinning of the cervix. Toward the end of pregnancy (32-34 weeks), Braxton Hicks contractions can happen more often and may become stronger. These contractions are sometimes difficult to tell apart from true labor because they can be very uncomfortable. You should not feel embarrassed if you go to  the hospital with false labor. Sometimes, the only way to tell if you are in true labor is for your health care provider to look for changes in the cervix. The health care provider will do a physical exam and may monitor your contractions. If you are not in true labor, the exam should show that your cervix is not dilating and your water has not broken. If there are no other health problems associated with your pregnancy, it is completely safe for you to be sent home with false labor. You may continue to have Braxton Hicks contractions until you go into true labor. How to tell the difference between true labor and false labor True labor  Contractions last 30-70 seconds.  Contractions become very regular.  Discomfort is usually felt in the top of the uterus, and it spreads to the lower abdomen and low back.  Contractions do not go away with walking.  Contractions usually become more intense and increase in frequency.  The cervix dilates and gets thinner. False labor  Contractions are usually shorter and not as strong as true labor contractions.  Contractions are usually irregular.  Contractions are often felt in the front of the lower abdomen and in the groin.  Contractions may go away when you walk around or change positions while lying down.  Contractions get weaker and are shorter-lasting as time goes on.  The cervix usually does not dilate or become thin. Follow these instructions at home:   Take over-the-counter and prescription medicines only as told by your health care provider.  Keep up with your usual exercises and follow other instructions from your health care provider.  Eat and drink lightly if you think you are going into labor.  If Braxton Hicks contractions are making you uncomfortable: ? Change your position from lying down or resting to walking, or change from walking to resting. ? Sit and rest in a tub of warm water. ? Drink enough fluid to keep your urine  pale yellow. Dehydration may cause these contractions. ? Do slow and deep breathing several times an hour.  Keep all follow-up prenatal visits as told by your health care provider. This is important. Contact a health care provider if:  You have a fever.  You have continuous pain in your abdomen. Get help right away if:  Your contractions become stronger, more regular, and closer together.  You have fluid leaking or gushing from your vagina.  You pass blood-tinged mucus (bloody show).  You have bleeding from your vagina.  You have low back pain that you never had before.  You feel your babys head pushing down and causing pelvic pressure.  Your baby is not moving inside you as much as it used to. Summary  Contractions that occur before labor are called Braxton Hicks contractions, false labor, or practice contractions.  Braxton Hicks contractions are usually shorter, weaker, farther apart, and  less regular than true labor contractions. True labor contractions usually become progressively stronger and regular, and they become more frequent.  Manage discomfort from [facility] contractions by changing position, resting in a warm bath, drinking plenty of water, or practicing deep breathing. This information is not intended to replace advice given to you by your health care provider. Make sure you discuss any questions you have with your health care provider. Document Released: 10/13/2016 Document Revised: 05/12/2017 Document Reviewed: 10/13/2016 Elsevier Patient Education  2020 [employer].

## 2019-01-19 LAB — STREP GP B NAA: Strep Gp B NAA: NEGATIVE

## 2019-01-23 LAB — GC/CHLAMYDIA PROBE AMP
Chlamydia trachomatis, NAA: NEGATIVE
Neisseria Gonorrhoeae by PCR: NEGATIVE

## 2019-01-24 ENCOUNTER — Encounter: Payer: Self-pay | Admitting: Advanced Practice Midwife

## 2019-01-24 ENCOUNTER — Other Ambulatory Visit: Payer: Self-pay

## 2019-01-24 ENCOUNTER — Ambulatory Visit (INDEPENDENT_AMBULATORY_CARE_PROVIDER_SITE_OTHER): Payer: Federal, State, Local not specified - PPO | Admitting: Advanced Practice Midwife

## 2019-01-24 VITALS — BP 122/75 | HR 84 | Wt 238.0 lb

## 2019-01-24 DIAGNOSIS — Z331 Pregnant state, incidental: Secondary | ICD-10-CM

## 2019-01-24 DIAGNOSIS — Z1389 Encounter for screening for other disorder: Secondary | ICD-10-CM

## 2019-01-24 DIAGNOSIS — Z3A37 37 weeks gestation of pregnancy: Secondary | ICD-10-CM

## 2019-01-24 DIAGNOSIS — Z3483 Encounter for supervision of other normal pregnancy, third trimester: Secondary | ICD-10-CM

## 2019-01-24 LAB — POCT URINALYSIS DIPSTICK OB
Blood, UA: NEGATIVE
Glucose, UA: NEGATIVE
Ketones, UA: NEGATIVE
Leukocytes, UA: NEGATIVE
Nitrite, UA: NEGATIVE
POC,PROTEIN,UA: NEGATIVE

## 2019-01-24 NOTE — Progress Notes (Signed)
LOW-RISK PREGNANCY VISIT Patient name: Gloria Lyons MRN [id number]  Date of birth: 06/29/1990 Chief Complaint:   Routine Prenatal Visit  History of Present Illness:   Gloria Lyons is a 28 y.o. G41P1001 female at [redacted]w[redacted]d with an Estimated Date of Delivery: 02/13/19 being seen today for ongoing management of a low-risk pregnancy.  Today she reports having some occ BPs >90,usually in the evenings. . Contractions: Irregular.  .  Movement: Present. denies leaking of fluid. Review of Systems:   Pertinent items are noted in HPI Denies abnormal vaginal discharge w/ itching/odor/irritation, headaches, visual changes, shortness of breath, chest pain, abdominal pain, severe nausea/vomiting, or problems with urination or bowel movements unless otherwise stated above.  Pertinent History Reviewed:  Medical & Surgical Hx:   Past Medical History:  Diagnosis Date  . Hypothyroidism    stated several years ago and does not take meds for it   Past Surgical History:  Procedure Laterality Date  . NO PAST SURGERIES     Family History  Problem Relation Age of Onset  . Cancer Paternal Grandfather   . Diabetes Paternal Grandfather   . Cancer Maternal Grandmother   . Diabetes Maternal Grandmother   . Diabetes Maternal Grandfather   . Leukemia Father   . Hypertension Father   . Heart attack Mother   . Seizures Mother   . Hypertension Mother   . Hypertension Paternal Aunt   . Hypertension Paternal Aunt     Current Outpatient Medications:  .  aspirin EC 81 MG tablet, Take 162 mg by mouth daily., Disp: , Rfl:  .  Blood Pressure Monitor MISC, For regular home bp monitoring during pregnancy, Disp: 1 each, Rfl: 0 .  prenatal vitamin w/FE, FA (PRENATAL 1 + 1) 27-1 MG TABS tablet, Take 1 tablet by mouth daily at 12 noon., Disp: 30 each, Rfl: 12 Social History: Reviewed -  reports that she is a non-smoker but has been exposed to tobacco smoke. She has never used smokeless tobacco.  Physical Assessment:    Vitals:   01/24/19 1456  BP: 122/75  Pulse: 84  Weight: 238 lb (108 kg)  Body mass index is 39.61 kg/m.        Physical Examination:   General appearance: Well appearing, and in no distress  Mental status: Alert, oriented to person, place, and time  Skin: Warm & dry  Cardiovascular: Normal heart rate noted  Respiratory: Normal respiratory effort, no distress  Abdomen: Soft, gravid, nontender  Pelvic: Cervical exam deferred         Extremities: Edema: Trace  Fetal Status:     Movement: Present    Results for orders placed or performed in visit on 01/24/19 (from the past 24 hour(s))  POC Urinalysis Dipstick OB   Collection Time: 01/24/19  3:04 PM  Result Value Ref Range   Color, UA     Clarity, UA     Glucose, UA Negative Negative   Bilirubin, UA     Ketones, UA neg    Spec Grav, UA     Blood, UA neg    pH, UA     POC,PROTEIN,UA Negative Negative, Trace, Small (1+), Moderate (2+), Large (3+), 4+   Urobilinogen, UA     Nitrite, UA neg    Leukocytes, UA Negative Negative   Appearance     Odor      Assessment & Plan:  1) Low-risk pregnancy G2P1001 at [redacted]w[redacted]d with an Estimated Date of Delivery: 02/13/19   2)  borderline BPs at home, normal here. Will do in person visits to check urine protein, etc.    Labs/procedures/US today: none  Plan:  Continue routine obstetrical care    Follow-up: Return in about 1 week (around 01/31/2019) for LROB.  Orders Placed This Encounter  Procedures  . POC Urinalysis Dipstick OB   Jacklyn ShellFrances [name] [facility] 01/24/2019 3:29 PM

## 2019-01-24 NOTE — Patient Instructions (Signed)

## 2019-01-31 ENCOUNTER — Ambulatory Visit (INDEPENDENT_AMBULATORY_CARE_PROVIDER_SITE_OTHER): Payer: Federal, State, Local not specified - PPO | Admitting: Advanced Practice Midwife

## 2019-01-31 ENCOUNTER — Other Ambulatory Visit: Payer: Self-pay

## 2019-01-31 ENCOUNTER — Encounter: Payer: Self-pay | Admitting: Advanced Practice Midwife

## 2019-01-31 VITALS — BP 134/84 | HR 76 | Wt 238.0 lb

## 2019-01-31 DIAGNOSIS — Z331 Pregnant state, incidental: Secondary | ICD-10-CM

## 2019-01-31 DIAGNOSIS — Z3483 Encounter for supervision of other normal pregnancy, third trimester: Secondary | ICD-10-CM

## 2019-01-31 DIAGNOSIS — Z1389 Encounter for screening for other disorder: Secondary | ICD-10-CM

## 2019-01-31 DIAGNOSIS — Z3A38 38 weeks gestation of pregnancy: Secondary | ICD-10-CM

## 2019-01-31 LAB — POCT URINALYSIS DIPSTICK OB
Blood, UA: NEGATIVE
Glucose, UA: NEGATIVE
Ketones, UA: NEGATIVE
Nitrite, UA: NEGATIVE
POC,PROTEIN,UA: NEGATIVE

## 2019-01-31 NOTE — Patient Instructions (Signed)

## 2019-01-31 NOTE — Progress Notes (Signed)
LOW-RISK PREGNANCY VISIT Patient name: Gloria Lyons MRN [id number]  Date of birth: 02-25-1991 Chief Complaint:   Routine Prenatal Visit (? losing mucus plug)  History of Present Illness:   ALVERNA FAWLEY is a 28 y.o. G90P1001 female at [redacted]w[redacted]d with an Estimated Date of Delivery: 02/13/19 being seen today for ongoing management of a low-risk pregnancy.  Today she reports trying EPO.  Starting mat leave Saturday.. Contractions: Irregular. Vag. Bleeding: None.  Movement: Present. denies leaking of fluid. Review of Systems:   Pertinent items are noted in HPI Denies abnormal vaginal discharge w/ itching/odor/irritation, headaches, visual changes, shortness of breath, chest pain, abdominal pain, severe nausea/vomiting, or problems with urination or bowel movements unless otherwise stated above.  Pertinent History Reviewed:  Medical & Surgical Hx:   Past Medical History:  Diagnosis Date  . Hypothyroidism    stated several years ago and does not take meds for it   Past Surgical History:  Procedure Laterality Date  . NO PAST SURGERIES     Family History  Problem Relation Age of Onset  . Cancer Paternal Grandfather   . Diabetes Paternal Grandfather   . Cancer Maternal Grandmother   . Diabetes Maternal Grandmother   . Diabetes Maternal Grandfather   . Leukemia Father   . Hypertension Father   . Heart attack Mother   . Seizures Mother   . Hypertension Mother   . Hypertension Paternal Aunt   . Hypertension Paternal Aunt     Current Outpatient Medications:  .  aspirin EC 81 MG tablet, Take 162 mg by mouth daily., Disp: , Rfl:  .  Blood Pressure Monitor MISC, For regular home bp monitoring during pregnancy, Disp: 1 each, Rfl: 0 .  prenatal vitamin w/FE, FA (PRENATAL 1 + 1) 27-1 MG TABS tablet, Take 1 tablet by mouth daily at 12 noon., Disp: 30 each, Rfl: 12 Social History: Reviewed -  reports that she is a non-smoker but has been exposed to tobacco smoke. She has never used smokeless  tobacco.  Physical Assessment:   Vitals:   01/31/19 1531  BP: 134/84  Pulse: 76  Weight: 238 lb (108 kg)  Body mass index is 39.61 kg/m.        Physical Examination:   General appearance: Well appearing, and in no distress  Mental status: Alert, oriented to person, place, and time  Skin: Warm & dry  Cardiovascular: Normal heart rate noted  Respiratory: Normal respiratory effort, no distress  Abdomen: Soft, gravid, nontender  Pelvic: Cervical exam performed  Dilation: Closed Effacement (%): 50 Station: -2 super soft  Extremities: Edema: Trace  Fetal Status:     Movement: Present Presentation: Vertex  Results for orders placed or performed in visit on 01/31/19 (from the past 24 hour(s))  POC Urinalysis Dipstick OB   Collection Time: 01/31/19  3:33 PM  Result Value Ref Range   Color, UA     Clarity, UA     Glucose, UA Negative Negative   Bilirubin, UA     Ketones, UA neg    Spec Grav, UA     Blood, UA neg    pH, UA     POC,PROTEIN,UA Negative Negative, Trace, Small (1+), Moderate (2+), Large (3+), 4+   Urobilinogen, UA     Nitrite, UA neg    Leukocytes, UA Trace (A) Negative   Appearance     Odor      Assessment & Plan:  1) Low-risk pregnancy G2P1001 at [redacted]w[redacted]d with an Estimated  Date of Delivery: 02/13/19    Labs/procedures/US today: none  Plan:  Continue routine obstetrical care    Follow-up: Return in about 1 week (around 02/07/2019) for LROB.  Orders Placed This Encounter  Procedures  . POC Urinalysis Dipstick OB   Jacklyn ShellFrances [name] CNM 01/31/2019 4:08 PM

## 2019-02-07 ENCOUNTER — Ambulatory Visit (INDEPENDENT_AMBULATORY_CARE_PROVIDER_SITE_OTHER): Payer: Federal, State, Local not specified - PPO | Admitting: Medical

## 2019-02-07 ENCOUNTER — Encounter: Payer: Self-pay | Admitting: Medical

## 2019-02-07 ENCOUNTER — Inpatient Hospital Stay (HOSPITAL_COMMUNITY): Payer: Federal, State, Local not specified - PPO | Admitting: Anesthesiology

## 2019-02-07 ENCOUNTER — Other Ambulatory Visit: Payer: Self-pay

## 2019-02-07 ENCOUNTER — Telehealth (HOSPITAL_COMMUNITY): Payer: Self-pay | Admitting: *Deleted

## 2019-02-07 ENCOUNTER — Inpatient Hospital Stay (HOSPITAL_COMMUNITY)
Admission: AD | Admit: 2019-02-07 | Discharge: 2019-02-10 | DRG: 798 | Disposition: A | Discharge status: Home / Self Care | Payer: Federal, State, Local not specified - PPO | Attending: Family Medicine | Admitting: Family Medicine

## 2019-02-07 ENCOUNTER — Encounter (HOSPITAL_COMMUNITY): Payer: Self-pay | Admitting: *Deleted

## 2019-02-07 VITALS — BP 140/86 | HR 83 | Wt 245.0 lb

## 2019-02-07 DIAGNOSIS — Z3A39 39 weeks gestation of pregnancy: Secondary | ICD-10-CM | POA: Diagnosis not present

## 2019-02-07 DIAGNOSIS — E038 Other specified hypothyroidism: Secondary | ICD-10-CM

## 2019-02-07 DIAGNOSIS — Z7722 Contact with and (suspected) exposure to environmental tobacco smoke (acute) (chronic): Secondary | ICD-10-CM | POA: Diagnosis not present

## 2019-02-07 DIAGNOSIS — Z302 Encounter for sterilization: Secondary | ICD-10-CM

## 2019-02-07 DIAGNOSIS — O480 Post-term pregnancy: Secondary | ICD-10-CM | POA: Diagnosis not present

## 2019-02-07 DIAGNOSIS — Z3483 Encounter for supervision of other normal pregnancy, third trimester: Secondary | ICD-10-CM

## 2019-02-07 DIAGNOSIS — O133 Gestational [pregnancy-induced] hypertension without significant proteinuria, third trimester: Secondary | ICD-10-CM

## 2019-02-07 DIAGNOSIS — Z20828 Contact with and (suspected) exposure to other viral communicable diseases: Secondary | ICD-10-CM | POA: Diagnosis present

## 2019-02-07 DIAGNOSIS — O134 Gestational [pregnancy-induced] hypertension without significant proteinuria, complicating childbirth: Secondary | ICD-10-CM | POA: Diagnosis not present

## 2019-02-07 DIAGNOSIS — O99214 Obesity complicating childbirth: Secondary | ICD-10-CM | POA: Diagnosis present

## 2019-02-07 DIAGNOSIS — O09299 Supervision of pregnancy with other poor reproductive or obstetric history, unspecified trimester: Secondary | ICD-10-CM

## 2019-02-07 DIAGNOSIS — Z23 Encounter for immunization: Secondary | ICD-10-CM | POA: Diagnosis not present

## 2019-02-07 DIAGNOSIS — O09293 Supervision of pregnancy with other poor reproductive or obstetric history, third trimester: Secondary | ICD-10-CM

## 2019-02-07 DIAGNOSIS — Z1389 Encounter for screening for other disorder: Secondary | ICD-10-CM

## 2019-02-07 LAB — COMPREHENSIVE METABOLIC PANEL
ALT: 14 U/L (ref 0–44)
AST: 15 U/L (ref 15–41)
Albumin: 2.8 g/dL — ABNORMAL LOW (ref 3.5–5.0)
Alkaline Phosphatase: 150 U/L — ABNORMAL HIGH (ref 38–126)
Anion gap: 8 (ref 5–15)
BUN: 8 mg/dL (ref 6–20)
CO2: 22 mmol/L (ref 22–32)
Calcium: 9.5 mg/dL (ref 8.9–10.3)
Chloride: 105 mmol/L (ref 98–111)
Creatinine, Ser: 0.56 mg/dL (ref 0.44–1.00)
GFR calc Af Amer: >60 mL/min (ref >60)
GFR calc non Af Amer: >60 mL/min (ref >60)
Glucose, Bld: 85 mg/dL (ref 70–99)
Potassium: 4.2 mmol/L (ref 3.5–5.1)
Sodium: 135 mmol/L (ref 135–145)
Total Bilirubin: 0.4 mg/dL (ref 0.3–1.2)
Total Protein: 5.8 g/dL — ABNORMAL LOW (ref 6.5–8.1)

## 2019-02-07 LAB — SARS CORONAVIRUS 2 BY RT PCR (HOSPITAL ORDER, PERFORMED IN ~~LOC~~ HOSPITAL LAB): SARS Coronavirus 2: NEGATIVE

## 2019-02-07 LAB — POCT URINALYSIS DIPSTICK OB
Blood, UA: NEGATIVE
Glucose, UA: NEGATIVE
Ketones, UA: NEGATIVE
Nitrite, UA: NEGATIVE
POC,PROTEIN,UA: NEGATIVE

## 2019-02-07 LAB — ABO/RH: ABO/RH(D): B POS

## 2019-02-07 LAB — CBC
HCT: 35.3 % — ABNORMAL LOW (ref 36.0–46.0)
HCT: 33.8 % — ABNORMAL LOW (ref 36.0–46.0)
Hemoglobin: 11.2 g/dL — ABNORMAL LOW (ref 12.0–15.0)
Hemoglobin: 10.8 g/dL — ABNORMAL LOW (ref 12.0–15.0)
MCH: 26.9 pg (ref 26.0–34.0)
MCH: 26.8 pg (ref 26.0–34.0)
MCHC: 31.7 g/dL (ref 30.0–36.0)
MCHC: 32 g/dL (ref 30.0–36.0)
MCV: 84.9 fL (ref 80.0–100.0)
MCV: 83.9 fL (ref 80.0–100.0)
Platelets: 198 10*3/uL (ref 150–400)
Platelets: 189 10*3/uL (ref 150–400)
RBC: 4.16 MIL/uL (ref 3.87–5.11)
RBC: 4.03 MIL/uL (ref 3.87–5.11)
RDW: 14.6 % (ref 11.5–15.5)
RDW: 14.7 % (ref 11.5–15.5)
WBC: 11 10*3/uL — ABNORMAL HIGH (ref 4.0–10.5)
WBC: 13.1 10*3/uL — ABNORMAL HIGH (ref 4.0–10.5)
nRBC: 0 % (ref 0.0–0.2)
nRBC: 0 % (ref 0.0–0.2)

## 2019-02-07 LAB — TYPE AND SCREEN
ABO/RH(D): B POS
Antibody Screen: NEGATIVE

## 2019-02-07 LAB — PROTEIN / CREATININE RATIO, URINE
Creatinine, Urine: 115.94 mg/dL
Protein Creatinine Ratio: 0.1 mg/mg{Cre} (ref 0.00–0.15)
Total Protein, Urine: 12 mg/dL

## 2019-02-07 MED ORDER — ONDANSETRON HCL 4 MG/2ML IJ SOLN: 4.0000 mg | Freq: Four times a day (QID) | INTRAMUSCULAR | Status: DC | PRN

## 2019-02-07 MED ORDER — OXYCODONE-ACETAMINOPHEN 5-325 MG PO TABS: 2.0000 | ORAL_TABLET | ORAL | Status: DC | PRN

## 2019-02-07 MED ORDER — TERBUTALINE SULFATE 1 MG/ML IJ SOLN: 0.2500 mg | Freq: Once | INTRAMUSCULAR | Status: DC | PRN

## 2019-02-07 MED ORDER — SOD CITRATE-CITRIC ACID 500-334 MG/5ML PO SOLN: 30.0000 mL | ORAL | Status: DC | PRN

## 2019-02-07 MED ORDER — ACETAMINOPHEN 325 MG PO TABS: 650.0000 mg | ORAL_TABLET | ORAL | Status: DC | PRN

## 2019-02-07 MED ORDER — LACTATED RINGERS IV SOLN: 500.0000 mL | Freq: Once | INTRAVENOUS | Status: DC

## 2019-02-07 MED ORDER — PHENYLEPHRINE 40 MCG/ML (10ML) SYRINGE FOR IV PUSH (FOR BLOOD PRESSURE SUPPORT): 80.0000 ug | PREFILLED_SYRINGE | INTRAVENOUS | Status: DC | PRN

## 2019-02-07 MED ORDER — SODIUM CHLORIDE (PF) 0.9 % IJ SOLN
INTRAMUSCULAR | Status: DC | PRN
  Administered 2019-02-07: 12 mL/h via EPIDURAL

## 2019-02-07 MED ORDER — LACTATED RINGERS IV SOLN
INTRAVENOUS | Status: DC
  Administered 2019-02-07 (×2): via INTRAVENOUS

## 2019-02-07 MED ORDER — OXYTOCIN 40 UNITS IN NORMAL SALINE INFUSION - SIMPLE MED
2.5000 [IU]/h | INTRAVENOUS | Status: DC
  Administered 2019-02-08: 01:00:00 2.5 [IU]/h via INTRAVENOUS

## 2019-02-07 MED ORDER — FENTANYL-BUPIVACAINE-NACL 0.5-0.125-0.9 MG/250ML-% EP SOLN: 12.0000 mL/h | EPIDURAL | Status: DC | PRN

## 2019-02-07 MED ORDER — LIDOCAINE HCL (PF) 1 % IJ SOLN
INTRAMUSCULAR | Status: DC | PRN
  Administered 2019-02-07: 5 mL via EPIDURAL

## 2019-02-07 MED ORDER — LIDOCAINE HCL (PF) 1 % IJ SOLN: 30.0000 mL | INTRAMUSCULAR | Status: DC | PRN

## 2019-02-07 MED ORDER — EPHEDRINE 5 MG/ML INJ: 10.0000 mg | INTRAVENOUS | Status: DC | PRN

## 2019-02-07 MED ORDER — LACTATED RINGERS IV SOLN: 500.0000 mL | INTRAVENOUS | Status: DC | PRN

## 2019-02-07 MED ORDER — OXYTOCIN 40 UNITS IN NORMAL SALINE INFUSION - SIMPLE MED
1.0000 m[IU]/min | INTRAVENOUS | Status: DC
  Administered 2019-02-07: 2 m[IU]/min via INTRAVENOUS
  Filled 2019-02-07: qty 1000

## 2019-02-07 MED ORDER — DIPHENHYDRAMINE HCL 50 MG/ML IJ SOLN: 12.5000 mg | INTRAMUSCULAR | Status: DC | PRN

## 2019-02-07 MED ORDER — LACTATED RINGERS IV SOLN: INTRAVENOUS | Status: DC

## 2019-02-07 MED ORDER — LACTATED RINGERS IV SOLN
500.0000 mL | Freq: Once | INTRAVENOUS | Status: AC
  Administered 2019-02-07: 22:00:00 500 mL via INTRAVENOUS

## 2019-02-07 MED ORDER — OXYCODONE-ACETAMINOPHEN 5-325 MG PO TABS: 1.0000 | ORAL_TABLET | ORAL | Status: DC | PRN

## 2019-02-07 MED ORDER — OXYTOCIN BOLUS FROM INFUSION: 500.0000 mL | Freq: Once | INTRAVENOUS | Status: DC

## 2019-02-07 MED ORDER — FENTANYL-BUPIVACAINE-NACL 0.5-0.125-0.9 MG/250ML-% EP SOLN
12.0000 mL/h | EPIDURAL | Status: DC | PRN
  Filled 2019-02-07: qty 250

## 2019-02-07 MED ORDER — OXYTOCIN 40 UNITS IN NORMAL SALINE INFUSION - SIMPLE MED: 2.5000 [IU]/h | INTRAVENOUS | Status: DC

## 2019-02-07 MED ORDER — LACTATED RINGERS IV SOLN
500.0000 mL | INTRAVENOUS | Status: DC | PRN
  Administered 2019-02-07: 500 mL via INTRAVENOUS

## 2019-02-07 MED ORDER — OXYTOCIN BOLUS FROM INFUSION
500.0000 mL | Freq: Once | INTRAVENOUS | Status: AC
  Administered 2019-02-08: 500 mL via INTRAVENOUS

## 2019-02-07 MED ORDER — ONDANSETRON HCL 4 MG/2ML IJ SOLN
4.0000 mg | Freq: Four times a day (QID) | INTRAMUSCULAR | Status: DC | PRN
Start: 2019-02-07 — End: 2019-02-07

## 2019-02-07 MED ORDER — LIDOCAINE HCL (PF) 1 % IJ SOLN
30.0000 mL | INTRAMUSCULAR | Status: DC | PRN
Start: 2019-02-07 — End: 2019-02-07

## 2019-02-07 NOTE — MAU Note (Signed)
Pt sent from office for elevated b/p. Reports mild headache hands and feet swelling. Good fetal movement. Denies any leaking and reports some mild cramping after MD stripped her membranes.

## 2019-02-07 NOTE — MAU Provider Note (Addendum)
Chief Complaint:  Hypertension   First Provider Initiated Contact with Patient 02/07/19 1328     HPI: Gloria Lyons is a 28 y.o. G2P1001 at 3654w1d who presents to maternity admissions [facility] elevated blood pressure. Patient reports elevated SBP over the past month (mostly 130s, a few readings in 140s). She reports current headache of 2/10 [facility] asked if she had a headache, describes it as a "dull ache" in the bilateral temporal area. Headache comes and goes. She also reports seeing spots in her vision and sometimes feels like things in her vision are moving towards her even if they are not. She reports "stretching pains" mostly in her mid abdomen from the baby. Denies RUQ pain. She has been feeling irregular contractions. She also reports difficulty urinating (feels like her bladder is full but can't go) but denies pain or burning with urination. Denies n/v, constipation, fevers, or chills. Denies leakage of fluid or vaginal bleeding. Good fetal movement.   Pregnancy Course:   Past Medical History:  Diagnosis Date  . Hypothyroidism    stated several years ago and does not take meds for it   OB History  Gravida Para Term Preterm AB Living  2 1 1     1   SAB TAB Ectopic Multiple Live Births        0 1    # Outcome Date GA Lbr Len/2nd Weight Sex Delivery Anes PTL Lv  2 Current           1 Term 08/25/17 5352w4d 03:16 / 00:31 3975 g F Vag-Spont EPI  LIV   Past Surgical History:  Procedure Laterality Date  . NO PAST SURGERIES     Family History  Problem Relation Age of Onset  . Cancer Paternal Grandfather   . Diabetes Paternal Grandfather   . Cancer Maternal Grandmother   . Diabetes Maternal Grandmother   . Diabetes Maternal Grandfather   . Leukemia Father   . Hypertension Father   . Heart attack Mother   . Seizures Mother   . Hypertension Mother   . Hypertension Paternal Aunt   . Hypertension Paternal Aunt     Social History   Tobacco Use  . Smoking status: Passive Smoke Exposure - Never Smoker  . Smokeless tobacco: Never Used  Substance Use Topics  . Alcohol use: No    Comment: not now  . Drug use: No   No Known Allergies Medications Prior to Admission  Medication Sig Dispense Refill Last Dose  . aspirin EC 81 MG tablet Take 162 mg by mouth daily.   02/07/2019 at Unknown time  . Calcium Carbonate Antacid (TUMS PO) Take by mouth as needed.   02/07/2019 at Unknown time  . prenatal vitamin w/FE, FA (PRENATAL 1 + 1) 27-1 MG TABS tablet Take 1 tablet by mouth daily at 12 noon. 30 each 12 02/07/2019 at Unknown time  . Blood Pressure Monitor MISC For regular home bp monitoring during pregnancy 1 each 0     I have reviewed patient's Past Medical Hx, Surgical Hx, Family Hx, Social Hx, medications and allergies.   ROS:  Review of Systems  Constitutional: Negative for chills and fever.  Eyes: Positive for visual disturbance (feels like things are moving towards her in her vision, spots).  Gastrointestinal: Positive for abdominal pain ("stretching", no RUQ pain). Negative for constipation, nausea and vomiting.  Genitourinary: Positive for difficulty urinating and vaginal discharge (clear  mucus). Negative for dysuria and vaginal bleeding.       Irregular contractions  Neurological: Positive for numbness (in hands after sleeping on side, thinks it is due to the position she sleeps in) and headaches.    Physical Exam   Patient Vitals for the past 24 hrs:  BP Temp Pulse Resp Height Weight  02/07/19 1416 125/82 - 72 - - -  02/07/19 1401 128/79 - 74 - - -  02/07/19 1346 (!) 136/92 - 85 - - -  02/07/19 1331 127/80 - 82 - - -  02/07/19 1323 132/76 - 78 - - -  02/07/19 1307 (!) 131/93 98.6 F (37 C) 92 18 5\' 5"  (1.651 m) 109.8 kg    Constitutional: Well-developed, well-nourished female in no acute distress.  Cardiovascular: normal rate & rhythm, no murmur Respiratory: normal effort, lung sounds  clear throughout GI: Abd soft, non-tender, gravid appropriate for gestational age. Pos BS x 4 MS: Extremities nontender, no edema, normal ROM Neurologic: Alert and oriented x 4.  DTR's 2+ throughout.     FHT: 135-140 baseline, good accelerations, no decelerations, reactive strip Toco: irregular contractions   Labs: Results for orders placed or performed during the hospital encounter of 02/07/19 (from the past 24 hour(s))  CBC     Status: Abnormal   Collection Time: 02/07/19 12:56 PM  Result Value Ref Range   WBC 11.0 (H) 4.0 - 10.5 K/uL   RBC 4.16 3.87 - 5.11 MIL/uL   Hemoglobin 11.2 (L) 12.0 - 15.0 g/dL   HCT 86.5 (L) 78.4 - 69.6 %   MCV 84.9 80.0 - 100.0 fL   MCH 26.9 26.0 - 34.0 pg   MCHC 31.7 30.0 - 36.0 g/dL   RDW 29.5 28.4 - 13.2 %   Platelets 198 150 - 400 K/uL   nRBC 0.0 0.0 - 0.2 %  Comprehensive metabolic panel     Status: Abnormal   Collection Time: 02/07/19 12:56 PM  Result Value Ref Range   Sodium 135 135 - 145 mmol/L   Potassium 4.2 3.5 - 5.1 mmol/L   Chloride 105 98 - 111 mmol/L   CO2 22 22 - 32 mmol/L   Glucose, Bld 85 70 - 99 mg/dL   BUN 8 6 - 20 mg/dL   Creatinine, Ser 4.40 0.44 - 1.00 mg/dL   Calcium 9.5 8.9 - 10.2 mg/dL   Total Protein 5.8 (L) 6.5 - 8.1 g/dL   Albumin 2.8 (L) 3.5 - 5.0 g/dL   AST 15 15 - 41 U/L   ALT 14 0 - 44 U/L   Alkaline Phosphatase 150 (H) 38 - 126 U/L   Total Bilirubin 0.4 0.3 - 1.2 mg/dL   GFR calc non Af Amer >60 >60 mL/min   GFR calc Af Amer >60 >60 mL/min   Anion gap 8 5 - 15  Protein / creatinine ratio, urine     Status: None   Collection Time: 02/07/19  1:06 PM  Result Value Ref Range   Creatinine, Urine 115.94 mg/dL   Total Protein, Urine 12 mg/dL   Protein Creatinine Ratio 0.10 0.00 - 0.15 mg/mg[Cre]    Imaging:  n/a  MAU Course: Orders Placed This Encounter  Procedures  . CBC  . Comprehensive metabolic panel  . Protein / creatinine ratio, urine   No orders of the defined types were placed in this  encounter.   MDM: CBC: WBC 11.0, platelets 198 CMP: AST 15, ALT 14 PCr: 0.10  Pt has gestational hypertension based  on at home elevated SBP > 140 and office reading today. Workup negative for preeclampsia.  Assessment: 1. Gestational hypertension, third trimester   2. [redacted] weeks gestation of pregnancy     Plan: Admitted to L&D for induction of labor given diagnosis of gestational hypertension.   Allergies as of 02/07/2019   No Known Allergies      Jones Broom, Medical Student 02/07/2019 3:09 PM        I confirm that I have verified the information documented in the medical student's note and that I have also personally reperformed the history, physical exam and all medical decision making activities of this service and have verified that all service and findings are accurately documented in this student's note.   Pt with hx of preeclampsia in pregnancy last year. New onset hypertension at 39+ wks of pregnancy. Also reports some elevations on her home cuff recently. Mild headache that she declines medication for. PEC labs negative. Will admit to birthing suites for IOL per consult with Dr. Ilda Basset.   NST:  Baseline: 135 bpm, Variability: Good {> 6 bpm), Accelerations: Reactive and Decelerations: Absent   Jorje Guild, NP 02/07/2019 3:39 PM

## 2019-02-07 NOTE — Anesthesia Procedure Notes (Addendum)
Epidural Patient location during procedure: OB Start time: 02/07/2019 10:58 PM End time: 02/07/2019 11:08 PM  Staffing Anesthesiologist: Barnet Glasgow, MD Performed: anesthesiologist   Preanesthetic Checklist Completed: patient identified, site marked, surgical consent, pre-op evaluation, timeout performed, IV checked, risks and benefits discussed and monitors and equipment checked  Epidural Patient position: sitting Prep: site prepped and draped and DuraPrep Patient monitoring: continuous pulse ox and blood pressure Approach: midline Location: L3-L4 Injection technique: LOR air  Needle:  Needle type: Tuohy  Needle gauge: 17 G Needle length: 9 cm and 9 Needle insertion depth: 7 cm Catheter type: closed end flexible Catheter size: 19 Gauge Catheter at skin depth: 13 cm Test dose: negative  Assessment Events: blood not aspirated, injection not painful, no injection resistance, negative IV test and no paresthesia  Additional Notes Patient identified. Risks/Benefits/Options discussed with patient including but not limited to bleeding, infection, nerve damage, paralysis, failed block, incomplete pain control, headache, blood pressure changes, nausea, vomiting, reactions to medication both or allergic, itching and postpartum back pain. Confirmed with bedside nurse the patient's most recent platelet count. Confirmed with patient that they are not currently taking any anticoagulation, have any bleeding history or any family history of bleeding disorders. Patient expressed understanding and wished to proceed. All questions were answered. Sterile technique was used throughout the entire procedure. Please see nursing notes for vital signs. Test dose was given through epidural needle and negative prior to continuing to dose epidural or start infusion. Warning signs of high block given to the patient including shortness of breath, tingling/numbness in hands, complete motor block, or any  concerning symptoms with instructions to call for help. Patient was given instructions on fall risk and not to get out of bed. All questions and concerns addressed with instructions to call with any issues. 1  Attempt (S) . Patient tolerated procedure well.

## 2019-02-07 NOTE — Patient Instructions (Signed)
Fetal Movement Counts Patient Name: ________________________________________________ Patient Due Date: ____________________ What is a fetal movement count?  A fetal movement count is the number of times that you feel your baby move during a certain amount of time. This may also be called a fetal kick count. A fetal movement count is recommended for every pregnant woman. You may be asked to start counting fetal movements as early as week 28 of your pregnancy. Pay attention to when your baby is most active. You may notice your baby's sleep and wake cycles. You may also notice things that make your baby move more. You should do a fetal movement count:  When your baby is normally most active.  At the same time each day. A good time to count movements is while you are resting, after having something to eat and drink. How do I count fetal movements? 1. Find a quiet, comfortable area. Sit, or lie down on your side. 2. Write down the date, the start time and stop time, and the number of movements that you felt between those two times. Take this information with you to your health care visits. 3. For 2 hours, count kicks, flutters, swishes, rolls, and jabs. You should feel at least 10 movements during 2 hours. 4. You may stop counting after you have felt 10 movements. 5. If you do not feel 10 movements in 2 hours, have something to eat and drink. Then, keep resting and counting for 1 hour. If you feel at least 4 movements during that hour, you may stop counting. Contact a health care provider if:  You feel fewer than 4 movements in 2 hours.  Your baby is not moving like he or she usually does. Date: ____________ Start time: ____________ Stop time: ____________ Movements: ____________ Date: ____________ Start time: ____________ Stop time: ____________ Movements: ____________ Date: ____________ Start time: ____________ Stop time: ____________ Movements: ____________ Date: ____________ Start time:  ____________ Stop time: ____________ Movements: ____________ Date: ____________ Start time: ____________ Stop time: ____________ Movements: ____________ Date: ____________ Start time: ____________ Stop time: ____________ Movements: ____________ Date: ____________ Start time: ____________ Stop time: ____________ Movements: ____________ Date: ____________ Start time: ____________ Stop time: ____________ Movements: ____________ Date: ____________ Start time: ____________ Stop time: ____________ Movements: ____________ This information is not intended to replace advice given to you by your health care provider. Make sure you discuss any questions you have with your health care provider. Document Released: 06/29/2006 Document Revised: 06/19/2018 Document Reviewed: 07/09/2015 Elsevier Patient Education  2020 [employer] Braxton Hicks Contractions Contractions of the uterus can occur throughout pregnancy, but they are not always a sign that you are in labor. You may have practice contractions called Braxton Hicks contractions. These false labor contractions are sometimes confused with true labor. What are Braxton Hicks contractions? Braxton Hicks contractions are tightening movements that occur in the muscles of the uterus before labor. Unlike true labor contractions, these contractions do not result in opening (dilation) and thinning of the cervix. Toward the end of pregnancy (32-34 weeks), Braxton Hicks contractions can happen more often and may become stronger. These contractions are sometimes difficult to tell apart from true labor because they can be very uncomfortable. You should not feel embarrassed if you go to the hospital with false labor. Sometimes, the only way to tell if you are in true labor is for your health care provider to look for changes in the cervix. The health care provider will do a physical exam and may monitor your contractions. If you   are not in true labor, the exam should show  that your cervix is not dilating and your water has not broken. If there are no other health problems associated with your pregnancy, it is completely safe for you to be sent home with false labor. You may continue to have Braxton Hicks contractions until you go into true labor. How to tell the difference between true labor and false labor True labor  Contractions last 30-70 seconds.  Contractions become very regular.  Discomfort is usually felt in the top of the uterus, and it spreads to the lower abdomen and low back.  Contractions do not go away with walking.  Contractions usually become more intense and increase in frequency.  The cervix dilates and gets thinner. False labor  Contractions are usually shorter and not as strong as true labor contractions.  Contractions are usually irregular.  Contractions are often felt in the front of the lower abdomen and in the groin.  Contractions may go away when you walk around or change positions while lying down.  Contractions get weaker and are shorter-lasting as time goes on.  The cervix usually does not dilate or become thin. Follow these instructions at home:   Take over-the-counter and prescription medicines only as told by your health care provider.  Keep up with your usual exercises and follow other instructions from your health care provider.  Eat and drink lightly if you think you are going into labor.  If Braxton Hicks contractions are making you uncomfortable: ? Change your position from lying down or resting to walking, or change from walking to resting. ? Sit and rest in a tub of warm water. ? Drink enough fluid to keep your urine pale yellow. Dehydration may cause these contractions. ? Do slow and deep breathing several times an hour.  Keep all follow-up prenatal visits as told by your health care provider. This is important. Contact a health care provider if:  You have a fever.  You have continuous pain in  your abdomen. Get help right away if:  Your contractions become stronger, more regular, and closer together.  You have fluid leaking or gushing from your vagina.  You pass blood-tinged mucus (bloody show).  You have bleeding from your vagina.  You have low back pain that you never had before.  You feel your baby's head pushing down and causing pelvic pressure.  Your baby is not moving inside you as much as it used to. Summary  Contractions that occur before labor are called Braxton Hicks contractions, false labor, or practice contractions.  Braxton Hicks contractions are usually shorter, weaker, farther apart, and less regular than true labor contractions. True labor contractions usually become progressively stronger and regular, and they become more frequent.  Manage discomfort from Braxton Hicks contractions by changing position, resting in a warm bath, drinking plenty of water, or practicing deep breathing. This information is not intended to replace advice given to you by your health care provider. Make sure you discuss any questions you have with your health care provider. Document Released: 10/13/2016 Document Revised: 05/12/2017 Document Reviewed: 10/13/2016 Elsevier Patient Education  2020 [employer]  

## 2019-02-07 NOTE — H&P (Addendum)
OBSTETRIC ADMISSION HISTORY AND PHYSICAL  Gloria Lyons is a 28 y.o. female G2P1001 with IUP at 1339w1d by 14 week U/S presenting for IOL for gHTN. She reports a mild headache. She reports +FMs, No LOF, no VB, no blurry vision, or peripheral edema, and RUQ pain.  She plans on breast feeding. She request BTL for birth control. [facility]   Dating: By 14 week U/S --->  Estimated Date of Delivery: 02/13/19  Sono:  @[redacted]w[redacted]d , CWD, normal anatomy, cephalic presentation, 3062g, 16%88% EFW  Prenatal History/Complications: gHTN Pre-eclampsia during previous pregnancy  Past Medical History: Past Medical History:  Diagnosis Date  . Hypothyroidism    stated several years ago and does not take meds for it    Past Surgical History: Past Surgical History:  Procedure Laterality Date  . NO PAST SURGERIES      Obstetrical History: OB History    Gravida  2   Para  1   Term  1   Preterm      AB      Living  1     SAB      TAB      Ectopic      Multiple  0   Live Births  1           Social History: Social History   Socioeconomic History  . Marital status: Married    Spouse name: Melvyn NovasBill Kittel  . Number of children: 1  . Years of education: Not on file  . Highest education level: Not on file  Occupational History  . Not on file  Social Needs  . Financial resource strain: Not on file  . Food insecurity    Worry: Not on file    Inability: Not on file  . Transportation needs    Medical: Not on file    Non-medical: Not on file  Tobacco Use  . Smoking status: Passive Smoke Exposure - Never Smoker  . Smokeless tobacco: Never Used  Substance and Sexual Activity  . Alcohol use: No    Comment: not now  . Drug use: No  . Sexual activity: Yes    Birth control/protection: None  Lifestyle  . Physical activity    Days per week: Not on file    Minutes per session: Not on file  . Stress: Not on file  Relationships  . Social Wellsite geologistconnections     Talks on phone: Not on file    Gets together: Not on file    Attends religious service: Not on file    Active member of club or organization: Not on file    Attends meetings of clubs or organizations: Not on file    Relationship status: Not on file  Other Topics Concern  . Not on file  Social History Narrative  . Not on file    Family History: Family History  Problem Relation Age of Onset  . Cancer Paternal Grandfather   . Diabetes Paternal Grandfather   . Cancer Maternal Grandmother   . Diabetes Maternal Grandmother   . Diabetes Maternal Grandfather   . Leukemia Father   . Hypertension Father   . Heart attack Mother   . Seizures Mother   . Hypertension Mother   . Hypertension Paternal Aunt   . Hypertension Paternal Aunt     Allergies: No Known Allergies  Medications Prior to Admission  Medication Sig Dispense Refill Last Dose  . aspirin EC 81 MG tablet Take  162 mg by mouth daily.   02/07/2019 at Unknown time  . Calcium Carbonate Antacid (TUMS PO) Take by mouth as needed.   02/07/2019 at Unknown time  . prenatal vitamin w/FE, FA (PRENATAL 1 + 1) 27-1 MG TABS tablet Take 1 tablet by mouth daily at 12 noon. 30 each 12 02/07/2019 at Unknown time  . Blood Pressure Monitor MISC For regular home bp monitoring during pregnancy 1 each 0      Review of Systems   All systems reviewed and negative except as stated in HPI  Blood pressure 121/65, pulse 78, temperature 98.5 F (36.9 C), temperature source Oral, resp. rate 18, height 5\' 5"  (1.651 m), weight 109.8 kg, last menstrual period 03/21/2018, not currently breastfeeding. General appearance: alert, cooperative and appears stated age Lungs: clear to auscultation bilaterally Heart: regular rate and rhythm Abdomen: soft, non-tender; bowel sounds normal Presentation: cephalic Fetal monitoringBaseline: 140 bpm, Variability: Good {> 6 bpm) and Accelerations: Reactive Uterine activityNone Dilation: 3 Effacement (%): 50,  60 Station: -1 Exam by:: Dr. Homero Fellers  Prenatal labs: ABO, Rh: --/--/PENDING (08/27 1614) Antibody: PENDING (08/27 1614) Rubella: 6.31 (03/16 1612) RPR: Non Reactive (06/11 0924)  HBsAg: Negative (03/16 1612)  HIV: Non Reactive (06/11 0924)  GBS: Negative (08/06 1400)  2 hr Glucola 81/163/85 - negative Genetic screening  declined Anatomy US normal  Prenatal Transfer Tool  Maternal Diabetes: No Genetic Screening: Declined Maternal Ultrasounds/Referrals: Normal Fetal Ultrasounds or other Referrals:  None Maternal Substance Abuse:  No Significant Maternal Medications:  Meds include: Other:  Significant Maternal Lab Results: Group B Strep negative  Results for orders placed or performed during the hospital encounter of 02/07/19 (from the past 24 hour(s))  CBC   Collection Time: 02/07/19 12:56 PM  Result Value Ref Range   WBC 11.0 (H) 4.0 - 10.5 K/uL   RBC 4.16 3.87 - 5.11 MIL/uL   Hemoglobin 11.2 (L) 12.0 - 15.0 g/dL   HCT 31.2 (L) 81.1 - 88.6 %   MCV 84.9 80.0 - 100.0 fL   MCH 26.9 26.0 - 34.0 pg   MCHC 31.7 30.0 - 36.0 g/dL   RDW 77.3 73.6 - 68.1 %   Platelets 198 150 - 400 K/uL   nRBC 0.0 0.0 - 0.2 %  Comprehensive metabolic panel   Collection Time: 02/07/19 12:56 PM  Result Value Ref Range   Sodium 135 135 - 145 mmol/L   Potassium 4.2 3.5 - 5.1 mmol/L   Chloride 105 98 - 111 mmol/L   CO2 22 22 - 32 mmol/L   Glucose, Bld 85 70 - 99 mg/dL   BUN 8 6 - 20 mg/dL   Creatinine, Ser 5.94 0.44 - 1.00 mg/dL   Calcium 9.5 8.9 - 70.7 mg/dL   Total Protein 5.8 (L) 6.5 - 8.1 g/dL   Albumin 2.8 (L) 3.5 - 5.0 g/dL   AST 15 15 - 41 U/L   ALT 14 0 - 44 U/L   Alkaline Phosphatase 150 (H) 38 - 126 U/L   Total Bilirubin 0.4 0.3 - 1.2 mg/dL   GFR calc non Af Amer >60 >60 mL/min   GFR calc Af Amer >60 >60 mL/min   Anion gap 8 5 - 15  Protein / creatinine ratio, urine   Collection Time: 02/07/19  1:06 PM  Result Value Ref Range   Creatinine, Urine 115.94 mg/dL   Total Protein,  Urine 12 mg/dL   Protein Creatinine Ratio 0.10 0.00 - 0.15 mg/mg[Cre]  Type and screen [place]   Collection Time: 02/07/19  4:14 PM  Result Value Ref Range   ABO/RH(D) PENDING    Antibody Screen PENDING    Sample Expiration      02/10/2019,2359 Performed at [facility], [place] [address], [place], [place] [address]   Results for orders placed or performed in visit on 02/07/19 (from the past 24 hour(s))  POC Urinalysis Dipstick OB   Collection Time: 02/07/19 11:17 AM  Result Value Ref Range   Color, UA     Clarity, UA     Glucose, UA Negative Negative   Bilirubin, UA     Ketones, UA neg    Spec Grav, UA     Blood, UA neg    pH, UA     POC,PROTEIN,UA Negative Negative, Trace, Small (1+), Moderate (2+), Large (3+), 4+   Urobilinogen, UA     Nitrite, UA neg    Leukocytes, UA Moderate (2+) (A) Negative   Appearance     Odor      Patient Active Problem List   Diagnosis Date Noted  . Labor and delivery, indication for care 02/07/2019  . Post-dates pregnancy 02/07/2019  . Supervision of normal pregnancy 08/27/2018  . Hx of preeclampsia, prior pregnancy, currently pregnant 08/24/2017  . Hypothyroidism     Assessment/Plan:  CAMEY EDELL is a 28 y.o. G2P1001 at [redacted]w[redacted]d here for IOL for gHTN.  #Labor: Favorable cervix. Bishop score of 8. Plan to start Pit following small meal. #Pain: Iv pain meds for now. Epidural upon request #FWB:  Category I #ID:  GBS neg #MOF: Breast #MOC: BTL #Circ:  Yes, in hospital #gHTN: elevated BP in clinic this am and in MAU.  [place] labs negative for pre-e. Will continue to monitor.  Matilde Haymaker, MD  02/07/2019, 5:09 PM  I saw and evaluated the patient. I agree with the findings and the plan of care as documented in the resident's note. IOL for gHTN. Pre-E labs negative. EFW 3700g.   Barrington Ellison, MD [facility] Fellow, [facility] for [employer], [place]

## 2019-02-07 NOTE — Anesthesia Preprocedure Evaluation (Addendum)
Anesthesia Evaluation  Patient identified by MRN, date of birth, ID band Patient awake    Reviewed: Allergy & Precautions, NPO status , Patient's Chart, lab work & pertinent test results  Airway Mallampati: II  TM Distance: >3 FB Neck ROM: Full    Dental no notable dental hx. (+) Teeth Intact   Pulmonary neg pulmonary ROS,    Pulmonary exam normal breath sounds clear to auscultation       Cardiovascular hypertension, Pt. on medications Normal cardiovascular exam Rhythm:Regular Rate:Normal  Gestational HTN   Neuro/Psych negative neurological ROS  negative psych ROS   GI/Hepatic negative GI ROS, Neg liver ROS,   Endo/Other  Hypothyroidism   Renal/GU negative Renal ROS     Musculoskeletal   Abdominal (+) + obese,   Peds  Hematology negative hematology ROS (+) Hgb 10.8 Plt 189   Anesthesia Other Findings   Reproductive/Obstetrics (+) Pregnancy G2P1                            Lab Results  Component Value Date   WBC 13.1 (H) 02/07/2019   HGB 10.8 (L) 02/07/2019   HCT 33.8 (L) 02/07/2019   MCV 83.9 02/07/2019   PLT 189 02/07/2019    Lab Results  Component Value Date   CREATININE 0.56 02/07/2019   BUN 8 02/07/2019   NA 135 02/07/2019   K 4.2 02/07/2019   CL 105 02/07/2019   CO2 22 02/07/2019    Anesthesia Physical Anesthesia Plan  ASA: III  Anesthesia Plan: Epidural   Post-op Pain Management:    Induction:   PONV Risk Score and Plan:   Airway Management Planned:   Additional Equipment:   Intra-op Plan:   Post-operative Plan:   Informed Consent: I have reviewed the patients History and Physical, chart, labs and discussed the procedure including the risks, benefits and alternatives for the proposed anesthesia with the patient or authorized representative who has indicated his/her understanding and acceptance.     Dental advisory given  Plan Discussed with:    Anesthesia Plan Comments:         Anesthesia Quick Evaluation

## 2019-02-07 NOTE — Telephone Encounter (Signed)
Preadmission screen  

## 2019-02-07 NOTE — Progress Notes (Signed)
   PRENATAL VISIT NOTE  Subjective:  Gloria Lyons is a 28 y.o. G2P1001 at [redacted]w[redacted]d being seen today for ongoing prenatal care.  She is currently monitored for the following issues for this high-risk pregnancy and has Hypothyroidism; Hx of preeclampsia, prior pregnancy, currently pregnant; and Supervision of normal pregnancy on their problem list.  Patient reports headache and occasional contractions.  Contractions: Irregular. Vag. Bleeding: None.  Movement: Present. Denies leaking of fluid.   The following portions of the patient's history were reviewed and updated as appropriate: allergies, current medications, past family history, past medical history, past social history, past surgical history and problem list.   Objective:   Vitals:   02/07/19 1117  BP: 140/86  Pulse: 83  Weight: 245 lb (111.1 kg)    Fetal Status: Fetal Heart Rate (bpm): 144 Fundal Height: 36 cm Movement: Present  Presentation: Vertex  General:  Alert, oriented and cooperative. Patient is in no acute distress.  Skin: Skin is warm and dry. No rash noted.   Cardiovascular: Normal heart rate noted  Respiratory: Normal respiratory effort, no problems with respiration noted  Abdomen: Soft, gravid, appropriate for gestational age.  Pain/Pressure: Present     Pelvic: Cervical exam performed Dilation: 2.5 Effacement (%): 50 Station: -2  Extremities: Normal range of motion.  Edema: Trace  Mental Status: Normal mood and affect. Normal behavior. Normal judgment and thought content.   Assessment and Plan:  Pregnancy: G2P1001 at [redacted]w[redacted]d 1. Encounter for supervision of other normal pregnancy in third trimester - IOL scheduled for 41 weeks, orders entered in Epic  2. Screening for genitourinary condition - POC Urinalysis Dipstick OB  3. Hx of preeclampsia, prior pregnancy, currently pregnant - Initial and repeat BP in the office today > 140/90 - Patient states at home BP is in that range often as well, but we have no  documented elevated BP prior to today in the office  - Discussed with Dr. Elonda Husky, patient needs to go to MAU for further evaluation   4. Other specified hypothyroidism  Term labor symptoms and general obstetric precautions including but not limited to vaginal bleeding, contractions, leaking of fluid and fetal movement were reviewed in detail with the patient. Please refer to After Visit Summary for other counseling recommendations.   Return in about 1 week (around 02/14/2019) for LOB, NST .  Future Appointments  Date Time Provider [place]  02/14/2019  3:30 PM Jonnie Kind, MD [facility]  02/20/2019  7:30 AM [facility] [place] None    Kerry Hough, PA-C

## 2019-02-08 ENCOUNTER — Inpatient Hospital Stay (HOSPITAL_COMMUNITY): Payer: Federal, State, Local not specified - PPO | Admitting: Anesthesiology

## 2019-02-08 ENCOUNTER — Encounter (HOSPITAL_COMMUNITY): Payer: Self-pay | Admitting: *Deleted

## 2019-02-08 ENCOUNTER — Encounter (HOSPITAL_COMMUNITY)
Admission: AD | Disposition: A | Discharge status: Home / Self Care | Payer: Self-pay | Source: Home / Self Care | Attending: Family Medicine

## 2019-02-08 DIAGNOSIS — Z302 Encounter for sterilization: Secondary | ICD-10-CM

## 2019-02-08 DIAGNOSIS — O134 Gestational [pregnancy-induced] hypertension without significant proteinuria, complicating childbirth: Secondary | ICD-10-CM

## 2019-02-08 DIAGNOSIS — Z3A39 39 weeks gestation of pregnancy: Secondary | ICD-10-CM

## 2019-02-08 HISTORY — PX: TUBAL LIGATION: SHX77

## 2019-02-08 SURGERY — LIGATION, FALLOPIAN TUBE, POSTPARTUM
Anesthesia: Epidural | Site: Abdomen | Wound class: Clean

## 2019-02-08 MED ORDER — MIDAZOLAM HCL 2 MG/2ML IJ SOLN
INTRAMUSCULAR | Status: DC | PRN
  Administered 2019-02-08 (×4): 1 mg via INTRAVENOUS

## 2019-02-08 MED ORDER — LACTATED RINGERS IV SOLN
INTRAVENOUS | Status: DC
  Administered 2019-02-08 (×3): via INTRAVENOUS

## 2019-02-08 MED ORDER — ONDANSETRON HCL 4 MG PO TABS: 4.0000 mg | ORAL_TABLET | ORAL | Status: DC | PRN

## 2019-02-08 MED ORDER — ACETAMINOPHEN 325 MG PO TABS: 650.0000 mg | ORAL_TABLET | ORAL | Status: DC | PRN

## 2019-02-08 MED ORDER — SODIUM CHLORIDE 0.9 % IR SOLN
Status: DC | PRN
  Administered 2019-02-08: 1000 mL

## 2019-02-08 MED ORDER — TETANUS-DIPHTH-ACELL PERTUSSIS 5-2.5-18.5 LF-MCG/0.5 IM SUSP: 0.5000 mL | Freq: Once | INTRAMUSCULAR | Status: DC

## 2019-02-08 MED ORDER — IBUPROFEN 600 MG PO TABS
600.0000 mg | ORAL_TABLET | Freq: Four times a day (QID) | ORAL | Status: DC
  Administered 2019-02-08 (×2): 600 mg via ORAL
  Filled 2019-02-08 (×2): qty 1

## 2019-02-08 MED ORDER — LIDOCAINE-EPINEPHRINE (PF) 2 %-1:200000 IJ SOLN
INTRAMUSCULAR | Status: AC
  Filled 2019-02-08: qty 10

## 2019-02-08 MED ORDER — SENNOSIDES-DOCUSATE SODIUM 8.6-50 MG PO TABS: 2.0000 | ORAL_TABLET | ORAL | Status: DC

## 2019-02-08 MED ORDER — KETOROLAC TROMETHAMINE 30 MG/ML IJ SOLN
INTRAMUSCULAR | Status: DC | PRN
  Administered 2019-02-08: 30 mg via INTRAVENOUS

## 2019-02-08 MED ORDER — ACETAMINOPHEN 325 MG PO TABS
650.0000 mg | ORAL_TABLET | Freq: Four times a day (QID) | ORAL | Status: DC | PRN
  Administered 2019-02-08 – 2019-02-10 (×4): 650 mg via ORAL
  Filled 2019-02-08 (×4): qty 2

## 2019-02-08 MED ORDER — KETAMINE HCL 50 MG/5ML IJ SOSY
PREFILLED_SYRINGE | INTRAMUSCULAR | Status: AC
  Filled 2019-02-08: qty 5

## 2019-02-08 MED ORDER — MIDAZOLAM HCL 2 MG/2ML IJ SOLN
INTRAMUSCULAR | Status: AC
  Filled 2019-02-08: qty 2

## 2019-02-08 MED ORDER — COCONUT OIL OIL: 1.0000 "application " | TOPICAL_OIL | Status: DC | PRN

## 2019-02-08 MED ORDER — ZOLPIDEM TARTRATE 5 MG PO TABS: 5.0000 mg | ORAL_TABLET | Freq: Every evening | ORAL | Status: DC | PRN

## 2019-02-08 MED ORDER — BENZOCAINE-MENTHOL 20-0.5 % EX AERO
1.0000 "application " | INHALATION_SPRAY | CUTANEOUS | Status: DC | PRN
  Administered 2019-02-09: 1 via TOPICAL
  Filled 2019-02-08: qty 56

## 2019-02-08 MED ORDER — BENZOCAINE-MENTHOL 20-0.5 % EX AERO: 1.0000 "application " | INHALATION_SPRAY | CUTANEOUS | Status: DC | PRN

## 2019-02-08 MED ORDER — FENTANYL CITRATE (PF) 100 MCG/2ML IJ SOLN
25.0000 ug | INTRAMUSCULAR | Status: DC | PRN
  Administered 2019-02-08: 50 ug via INTRAVENOUS
  Administered 2019-02-08: 25 ug via INTRAVENOUS

## 2019-02-08 MED ORDER — OXYCODONE HCL 5 MG PO TABS
5.0000 mg | ORAL_TABLET | ORAL | Status: DC | PRN
  Administered 2019-02-08 – 2019-02-10 (×7): 5 mg via ORAL
  Filled 2019-02-08 (×8): qty 1

## 2019-02-08 MED ORDER — FENTANYL CITRATE (PF) 100 MCG/2ML IJ SOLN
INTRAMUSCULAR | Status: AC
  Filled 2019-02-08: qty 2

## 2019-02-08 MED ORDER — KETOROLAC TROMETHAMINE 30 MG/ML IJ SOLN
INTRAMUSCULAR | Status: AC
  Filled 2019-02-08: qty 1

## 2019-02-08 MED ORDER — SIMETHICONE 80 MG PO CHEW: 80.0000 mg | CHEWABLE_TABLET | ORAL | Status: DC | PRN

## 2019-02-08 MED ORDER — ONDANSETRON HCL 4 MG/2ML IJ SOLN
INTRAMUSCULAR | Status: AC
  Filled 2019-02-08: qty 2

## 2019-02-08 MED ORDER — ONDANSETRON HCL 4 MG/2ML IJ SOLN: 4.0000 mg | INTRAMUSCULAR | Status: DC | PRN

## 2019-02-08 MED ORDER — ONDANSETRON HCL 4 MG/2ML IJ SOLN
4.0000 mg | INTRAMUSCULAR | Status: DC | PRN
  Administered 2019-02-08: 15:00:00 4 mg via INTRAVENOUS

## 2019-02-08 MED ORDER — FENTANYL CITRATE (PF) 100 MCG/2ML IJ SOLN
INTRAMUSCULAR | Status: DC | PRN
  Administered 2019-02-08: 100 ug via EPIDURAL
  Administered 2019-02-08 (×2): 50 ug via INTRAVENOUS

## 2019-02-08 MED ORDER — BUPIVACAINE HCL (PF) 0.25 % IJ SOLN
INTRAMUSCULAR | Status: AC
  Filled 2019-02-08: qty 30

## 2019-02-08 MED ORDER — DIBUCAINE (PERIANAL) 1 % EX OINT: 1.0000 "application " | TOPICAL_OINTMENT | CUTANEOUS | Status: DC | PRN

## 2019-02-08 MED ORDER — MEASLES, MUMPS & RUBELLA VAC IJ SOLR: 0.5000 mL | Freq: Once | INTRAMUSCULAR | Status: DC

## 2019-02-08 MED ORDER — WITCH HAZEL-GLYCERIN EX PADS
1.0000 "application " | MEDICATED_PAD | CUTANEOUS | Status: DC | PRN
  Administered 2019-02-09: 1 via TOPICAL

## 2019-02-08 MED ORDER — BUPIVACAINE HCL (PF) 0.25 % IJ SOLN
INTRAMUSCULAR | Status: DC | PRN
  Administered 2019-02-08: 17 mL

## 2019-02-08 MED ORDER — ACETAMINOPHEN 10 MG/ML IV SOLN: 1000.0000 mg | Freq: Once | INTRAVENOUS | Status: DC | PRN

## 2019-02-08 MED ORDER — KETAMINE HCL 10 MG/ML IJ SOLN
INTRAMUSCULAR | Status: DC | PRN
  Administered 2019-02-08: 30 mg via INTRAVENOUS

## 2019-02-08 MED ORDER — DIPHENHYDRAMINE HCL 25 MG PO CAPS: 25.0000 mg | ORAL_CAPSULE | Freq: Four times a day (QID) | ORAL | Status: DC | PRN

## 2019-02-08 MED ORDER — IBUPROFEN 600 MG PO TABS
600.0000 mg | ORAL_TABLET | Freq: Three times a day (TID) | ORAL | Status: DC | PRN
  Administered 2019-02-08 – 2019-02-10 (×4): 600 mg via ORAL
  Filled 2019-02-08 (×5): qty 1

## 2019-02-08 MED ORDER — FAMOTIDINE 20 MG PO TABS
40.0000 mg | ORAL_TABLET | Freq: Once | ORAL | Status: AC
  Administered 2019-02-08: 12:00:00 40 mg via ORAL
  Filled 2019-02-08: qty 2

## 2019-02-08 MED ORDER — PRENATAL MULTIVITAMIN CH
1.0000 | ORAL_TABLET | Freq: Every day | ORAL | Status: DC
  Administered 2019-02-09 – 2019-02-10 (×2): 1 via ORAL
  Filled 2019-02-08 (×3): qty 1

## 2019-02-08 MED ORDER — SENNOSIDES-DOCUSATE SODIUM 8.6-50 MG PO TABS
2.0000 | ORAL_TABLET | ORAL | Status: DC
  Administered 2019-02-08 – 2019-02-09 (×2): 2 via ORAL
  Filled 2019-02-08 (×2): qty 2

## 2019-02-08 MED ORDER — MORPHINE SULFATE (PF) 0.5 MG/ML IJ SOLN
INTRAMUSCULAR | Status: AC
  Filled 2019-02-08: qty 10

## 2019-02-08 MED ORDER — METOCLOPRAMIDE HCL 10 MG PO TABS
10.0000 mg | ORAL_TABLET | Freq: Once | ORAL | Status: AC
  Administered 2019-02-08: 12:00:00 10 mg via ORAL
  Filled 2019-02-08: qty 1

## 2019-02-08 MED ORDER — PRENATAL MULTIVITAMIN CH
1.0000 | ORAL_TABLET | Freq: Every day | ORAL | Status: DC
  Filled 2019-02-08: qty 1

## 2019-02-08 MED ORDER — LIDOCAINE-EPINEPHRINE (PF) 2 %-1:200000 IJ SOLN
INTRAMUSCULAR | Status: DC | PRN
  Administered 2019-02-08: 5 mL via INTRADERMAL
  Administered 2019-02-08: 2 mL via INTRADERMAL
  Administered 2019-02-08: 5 mL via INTRADERMAL
  Administered 2019-02-08: 1 mL via INTRADERMAL
  Administered 2019-02-08: 2 mL via INTRADERMAL
  Administered 2019-02-08: 4 mL via INTRADERMAL
  Administered 2019-02-08: 5 mL via INTRADERMAL

## 2019-02-08 MED ORDER — WITCH HAZEL-GLYCERIN EX PADS: 1.0000 "application " | MEDICATED_PAD | CUTANEOUS | Status: DC | PRN

## 2019-02-08 MED ORDER — LIDOCAINE-EPINEPHRINE (PF) 2 %-1:200000 IJ SOLN
INTRAMUSCULAR | Status: AC
  Filled 2019-02-08: qty 20

## 2019-02-08 SURGICAL SUPPLY — 26 items
CLIP FILSHIE TUBAL LIGA STRL (Clip) ×3 IMPLANT
CLOTH BEACON ORANGE TIMEOUT ST (SAFETY) ×3 IMPLANT
DERMABOND ADVANCED (GAUZE/BANDAGES/DRESSINGS) ×2
DERMABOND ADVANCED .7 DNX12 (GAUZE/BANDAGES/DRESSINGS) IMPLANT
DRSG OPSITE POSTOP 3X4 (GAUZE/BANDAGES/DRESSINGS) ×3 IMPLANT
DURAPREP 26ML APPLICATOR (WOUND CARE) ×3 IMPLANT
GAUZE SPONGE 4X4 12PLY STRL LF (GAUZE/BANDAGES/DRESSINGS) ×2 IMPLANT
GLOVE BIO SURGEON STRL SZ7 (GLOVE) ×3 IMPLANT
GLOVE BIOGEL PI IND STRL 7.0 (GLOVE) ×2 IMPLANT
GLOVE BIOGEL PI INDICATOR 7.0 (GLOVE) ×4
GOWN STRL REUS W/TWL LRG LVL3 (GOWN DISPOSABLE) ×6 IMPLANT
NEEDLE HYPO 22GX1.5 SAFETY (NEEDLE) ×3 IMPLANT
NS IRRIG 1000ML POUR BTL (IV SOLUTION) ×3 IMPLANT
PACK ABDOMINAL MINOR (CUSTOM PROCEDURE TRAY) ×3 IMPLANT
PROTECTOR NERVE ULNAR (MISCELLANEOUS) ×3 IMPLANT
SPONGE LAP 4X18 RFD (DISPOSABLE) IMPLANT
SUT PLAIN 2 0 (SUTURE) ×2
SUT PLAIN ABS 2-0 CT1 27XMFL (SUTURE) IMPLANT
SUT VIC AB 0 CT1 27 (SUTURE) ×2
SUT VIC AB 0 CT1 27XBRD ANBCTR (SUTURE) ×1 IMPLANT
SUT VICRYL 4-0 PS2 18IN ABS (SUTURE) ×3 IMPLANT
SYR CONTROL 10ML LL (SYRINGE) ×3 IMPLANT
TAPE CLOTH SURG 4X10 WHT LF (GAUZE/BANDAGES/DRESSINGS) ×2 IMPLANT
TOWEL OR 17X24 6PK STRL BLUE (TOWEL DISPOSABLE) ×6 IMPLANT
TRAY FOLEY CATH SILVER 14FR (SET/KITS/TRAYS/PACK) ×3 IMPLANT
WATER STERILE IRR 1000ML POUR (IV SOLUTION) ×3 IMPLANT

## 2019-02-08 NOTE — Op Note (Signed)
Gloria Lyons 02/07/2019 - 02/08/2019  PREOPERATIVE DIAGNOSIS:  Multiparity, undesired fertility  POSTOPERATIVE DIAGNOSIS:  Multiparity, undesired fertility  PROCEDURE:  Postpartum Bilateral Tubal Sterilization using Filshie Clips   SURGEON: Dr.  Silas [place]  ASSISTANT:  Barrington Ellison, MD  ANESTHESIA:  Epidural and local analgesia using 2.95% Marcaine  COMPLICATIONS:  None immediate.  ESTIMATED BLOOD LOSS: 23 ml.   INDICATIONS: 28 y.o. [id number]  with undesired fertility,status post vaginal delivery, desires permanent sterilization.  Other reversible forms of contraception were discussed with patient; she declines all other modalities. Risks of procedure discussed with patient including but not limited to: risk of regret, permanence of method, bleeding, infection, injury to surrounding organs and need for additional procedures.  Failure risk of 0.5-1% with increased risk of ectopic gestation if pregnancy occurs was also discussed with patient.  Pt understands procedure includes the use of Filshie Clips.     FINDINGS:  Normal uterus and fallopian tubes.  PROCEDURE DETAILS: The patient was taken to the operating room where her epidural anesthesia was dosed up to surgical level and found to be adequate.  She was then placed in the dorsal supine position and prepped and draped in sterile fashion.  After an adequate timeout was performed, attention was turned to the patient's abdomen where a small vertical skin incision was made through the umbilical fold. The incision was taken down to the layer of fascia using the scalpel, and fascia was incised, and extended bilaterally using Mayo scissors. The peritoneum was entered in a sharp fashion. The bowel was distended with gas and impeding visualization.  The uterus was involuted much further than anticipated for being postpartum less than 20 hours.  RN assistant pushed the uterus up from below to bring fundus closer to the umbilical incision.  The  umbilical incision was extended slightly to be able to reach the fallopian tubes.  Attention was then turned to the patient's uterus, and left fallopian tube was identified and followed out to the fimbriated end.  A Filshie clip was placed on the left fallopian tube about 3 cm from the cornual attachment, with care given to incorporate the underlying mesosalpinx.  A similar process was carried out on the right side allowing for bilateral tubal sterilization.  Good hemostasis was noted overall.The instruments were then removed from the patient's abdomen and the fascial incision was repaired with 0 Vicryl, and the skin was closed with a 4-0 Vicryl subcuticular stitch. The patient tolerated the procedure well.  Instrument, sponge, and needle counts were correct times two.  The patient was then taken to the recovery room awake and in stable condition.

## 2019-02-08 NOTE — Anesthesia Preprocedure Evaluation (Signed)
Anesthesia Evaluation  Patient identified by MRN, date of birth, ID band Patient awake    Reviewed: Allergy & Precautions, NPO status , Patient's Chart, lab work & pertinent test results  Airway Mallampati: III  TM Distance: >3 FB Neck ROM: Full    Dental no notable dental hx.    Pulmonary neg pulmonary ROS,    Pulmonary exam normal breath sounds clear to auscultation       Cardiovascular negative cardio ROS Normal cardiovascular exam Rhythm:Regular Rate:Normal     Neuro/Psych negative neurological ROS  negative psych ROS   GI/Hepatic negative GI ROS, Neg liver ROS,   Endo/Other  Hypothyroidism Morbid obesity  Renal/GU negative Renal ROS  negative genitourinary   Musculoskeletal negative musculoskeletal ROS (+)   Abdominal   Peds  Hematology negative hematology ROS (+)   Anesthesia Other Findings PPTL  Reproductive/Obstetrics                             Anesthesia Physical Anesthesia Plan  ASA: III  Anesthesia Plan: Epidural   Post-op Pain Management:    Induction:   PONV Risk Score and Plan: Treatment may vary due to age or medical condition  Airway Management Planned: Natural Airway  Additional Equipment:   Intra-op Plan:   Post-operative Plan:   Informed Consent: I have reviewed the patients History and Physical, chart, labs and discussed the procedure including the risks, benefits and alternatives for the proposed anesthesia with the patient or authorized representative who has indicated his/her understanding and acceptance.       Plan Discussed with: Anesthesiologist  Anesthesia Plan Comments: (Patient identified. Risks, benefits, options discussed with patient including but not limited to bleeding, infection, nerve damage, paralysis, failed block, incomplete pain control, headache, blood pressure changes, nausea, vomiting, reactions to medication, itching, and post  partum back pain. Confirmed with bedside nurse the patient's most recent platelet count. Confirmed with the patient that they are not taking any anticoagulation, have any bleeding history or any family history of bleeding disorders. Patient expressed understanding and wishes to proceed. All questions were answered. )        Anesthesia Quick Evaluation

## 2019-02-08 NOTE — Discharge Summary (Addendum)
Postpartum Discharge Summary     Patient Name: Gloria Lyons DOB: 08-05-1990 MRN: [id number]  Date of admission: 02/07/2019 Delivering Provider: Aviva SignsWILLIAMS, MARIE L   Date of discharge: 02/10/2019  Admitting diagnosis: high BP Intrauterine pregnancy: 8065w2d     Secondary diagnosis:  Active Problems:   Labor and delivery, indication for care   Post-dates pregnancy  Additional problems: none     Discharge diagnosis: Term Pregnancy Delivered and Gestational Hypertension                                                                                                Post partum procedures:none  Augmentation: Pitocin  Complications: None  Hospital course:  Induction of Labor With Vaginal Delivery   28 y.o. yo G2P1001 at 3165w2d was admitted to the hospital 02/07/2019 for induction of labor.  Indication for induction: Gestational hypertension.  Patient had an uncomplicated labor course as follows: Membrane Rupture Time/Date: 12:30 AM ,02/08/2019   Had Pitocin for induction and progressed steadily Progressed quickly from 6>10 in one hour  Intrapartum Procedures: Episiotomy: None [1]                                         Lacerations:  None [1]  Patient had delivery of a Viable infant.  Information for the patient's newborn:  Penni HomansHarris, Boy [place] [[id number]][[id number]]  Delivery Method: Vaginal, Spontaneous(Filed from Delivery Summary)    02/08/2019  Details of delivery can be found in separate delivery note.  Patient had a routine postpartum course. Patient is discharged home 02/10/19.  Magnesium Sulfate recieved: No BMZ received: No  Physical exam  Vitals:   02/09/19 0630 02/09/19 1537 02/09/19 2126 02/10/19 0535  BP: 121/71 123/73 136/64 127/86  Pulse: 78 97 92 89  Resp: 18 16 18 18   Temp: 97.9 F (36.6 C) 97.9 F (36.6 C) 98.8 F (37.1 C) 98.6 F (37 C)  TempSrc: Oral Oral Oral Oral  SpO2: 99% 100% 98% 99%  Weight:      Height:       General: alert, cooperative and no  distress Lochia: appropriate Uterine Fundus: firm Incision: Healing well with no significant drainage, No significant erythema, Dressing is clean, dry, and intact DVT Evaluation: No evidence of DVT seen on physical exam. Labs: Lab Results  Component Value Date   WBC 13.1 (H) 02/07/2019   HGB 10.8 (L) 02/07/2019   HCT 33.8 (L) 02/07/2019   MCV 83.9 02/07/2019   PLT 189 02/07/2019   CMP Latest Ref Rng & Units 02/07/2019  Glucose 70 - 99 mg/dL 85  BUN 6 - 20 mg/dL 8  Creatinine 5.780.44 - 4.691.00 mg/dL 6.290.56  Sodium 528135 - 413145 mmol/L 135  Potassium 3.5 - 5.1 mmol/L 4.2  Chloride 98 - 111 mmol/L 105  CO2 22 - 32 mmol/L 22  Calcium 8.9 - 10.3 mg/dL 9.5  Total Protein 6.5 - 8.1 g/dL 2.4(M5.8(L)  Total Bilirubin 0.3 - 1.2 mg/dL 0.4  Alkaline Phos 38 - 126 U/L 150(H)  AST 15 - 41 U/L 15  ALT 0 - 44 U/L 14    Discharge instruction: per After Visit Summary and "Baby and Me Booklet".  After visit meds:  Allergies as of 02/10/2019   No Known Allergies     Medication List    TAKE these medications   acetaminophen 325 MG tablet Commonly known as: Tylenol Take 2 tablets (650 mg total) by mouth every 6 (six) hours as needed (for pain scale < 4).   aspirin EC 81 MG tablet Take 162 mg by mouth daily.   Blood Pressure Monitor Misc For regular home bp monitoring during pregnancy   oxyCODONE 5 MG immediate release tablet Commonly known as: Oxy IR/ROXICODONE Take 1 tablet (5 mg total) by mouth every 4 (four) hours as needed (pain scale 4-7).   prenatal vitamin w/FE, FA 27-1 MG Tabs tablet Take 1 tablet by mouth daily at 12 noon.   TUMS PO Take by mouth as needed.       Diet: routine diet  Activity: Advance as tolerated. Pelvic rest for 6 weeks.   Outpatient follow up:6 weeks, 1 week Follow up Appt: Future Appointments  Date Time Provider [place]  02/15/2019 11:30 AM [facility] NURSE [facility]  03/15/2019 10:30 AM Roma Schanz, CNM [facility]   Follow up  Visit:   Please schedule this patient for Postpartum visit in: 6 weeks with the following provider: Any provider For C/S patients schedule nurse incision check in weeks 2 weeks: yes Low risk pregnancy complicated by: HTN Delivery mode:  SVD Anticipated Birth Control:  BTL done PP PP Procedures needed: BP check   Newborn Data: Live born female  Birth Weight:   APGAR: 58,   Newborn Delivery   Birth date/time: 02/08/2019 00:36:00 Delivery type: Vaginal, Spontaneous      Baby Feeding: Breast Disposition:home with mother   02/10/2019 Matilde Haymaker, MD   I confirm that I have verified the information documented in the resident student's note and that I have also personally reperformed the history, physical exam and all medical decision making activities of this service and have verified that all service and findings are accurately documented in this student's note.   Wende Mott, [place] 02/10/2019 7:02 PM

## 2019-02-08 NOTE — Anesthesia Postprocedure Evaluation (Signed)
Anesthesia Post Note  Patient: ANELI ZARA  Procedure(s) Performed: AN AD [place]     Patient location during evaluation: Mother Baby Anesthesia Type: Epidural Level of consciousness: awake and alert and oriented Pain management: satisfactory to patient Vital Signs Assessment: post-procedure vital signs reviewed and stable Respiratory status: respiratory function stable Cardiovascular status: stable Postop Assessment: no headache, no backache, epidural receding, patient able to bend at knees, no signs of nausea or vomiting and adequate PO intake Anesthetic complications: no    Last Vitals:  Vitals:   02/08/19 0345 02/08/19 0730  BP: 123/84 120/83  Pulse: 70 78  Resp: 20 20  Temp: 37.1 C 36.9 C  SpO2: 98% 97%    Last Pain:  Vitals:   02/08/19 0730  TempSrc: Oral  PainSc:    Pain Goal:                   Marye Eagen

## 2019-02-08 NOTE — Progress Notes (Signed)
Patient ID: Gloria Lyons, female   DOB: 1990/10/13, 28 y.o.   MRN: [id number] Late entry Rounded earlier  Doing well  Vitals:   02/07/19 2320 02/07/19 2325 02/07/19 2330 02/07/19 2335  BP: 129/80 127/81 126/72 138/78  Pulse: 60 69 76 72  Resp: 18 20 20 20   Temp:      TempSrc:      SpO2: 99% 98% 99% 99%  Weight:      Height:       FHR reassuring Category I UCs q 2-4 min  Cervix deferred  Continue to observe

## 2019-02-08 NOTE — Transfer of Care (Signed)
Immediate Anesthesia Transfer of Care Note  Patient: ADALIA PETTIS  Procedure(s) Performed: POST PARTUM TUBAL LIGATION (N/A Abdomen)  Patient Location: PACU  Anesthesia Type:Epidural  Level of Consciousness: awake, alert , oriented and patient cooperative  Airway & Oxygen Therapy: Patient Spontanous Breathing  Post-op Assessment: Report given to RN and Post -op Vital signs reviewed and stable  Post vital signs: Reviewed and stable  Last Vitals:  Vitals Value Taken Time  BP 128/83 02/08/19 1632  Temp    Pulse 75 02/08/19 1635  Resp 18 02/08/19 1635  SpO2 99 % 02/08/19 1635  Vitals shown include unvalidated device data.  Last Pain:  Vitals:   02/08/19 1245  TempSrc: Oral  PainSc:          Complications: No apparent anesthesia complications

## 2019-02-09 ENCOUNTER — Encounter (HOSPITAL_COMMUNITY): Payer: Self-pay | Admitting: Obstetrics & Gynecology

## 2019-02-09 LAB — RPR: RPR Ser Ql: NONREACTIVE

## 2019-02-09 NOTE — Anesthesia Postprocedure Evaluation (Signed)
Anesthesia Post Note  Patient: Gloria Lyons  Procedure(s) Performed: POST PARTUM TUBAL LIGATION (N/A Abdomen)     Patient location during evaluation: PACU Anesthesia Type: Epidural Level of consciousness: oriented and awake and alert Pain management: pain level controlled Vital Signs Assessment: post-procedure vital signs reviewed and stable Respiratory status: spontaneous breathing, respiratory function stable and patient connected to nasal cannula oxygen Cardiovascular status: blood pressure returned to baseline and stable Postop Assessment: no headache, no backache, no apparent nausea or vomiting and epidural receding Anesthetic complications: no    Last Vitals:  Vitals:   02/09/19 0315 02/09/19 0630  BP: 113/72 121/71  Pulse: 82 78  Resp: 20 18  Temp: 36.7 C 36.6 C  SpO2: 97% 99%    Last Pain:  Vitals:   02/09/19 0630  TempSrc: Oral  PainSc: 4    Pain Goal:                   [name] [name]

## 2019-02-09 NOTE — Progress Notes (Signed)
Post Partum Day 1/POD 1 following BTL Subjective: no complaints, up ad lib, voiding, tolerating PO and + flatus  Objective: Blood pressure 121/71, pulse 78, temperature 97.9 F (36.6 C), temperature source Oral, resp. rate 18, height 5\' 5"  (1.651 m), weight 109.8 kg, last menstrual period 03/21/2018, SpO2 99 %, unknown if currently breastfeeding.  Physical Exam:  General: alert, cooperative, appears stated age and no distress Lochia: appropriate Uterine Fundus: firm Incision: dressing clean dry and intact over BTL site DVT Evaluation: No evidence of DVT seen on physical exam.  Recent Labs    02/07/19 1256 02/07/19 2158  HGB 11.2* 10.8*  HCT 35.3* 33.8*    Assessment/Plan: Plan for discharge tomorrow and Breastfeeding   LOS: 2 days   Darlina Rumpf, CNM 02/09/2019, 11:05 AM

## 2019-02-09 NOTE — Lactation Note (Signed)
This note was copied from a baby's chart. Lactation Consultation Note  Patient Name: Gloria Lyons Date: 02/09/2019 Reason for consult: Initial assessment;Term;Difficult latch;Infant weight loss  22 hours old FT female who is being exclusively BF by his mother, she's a P2 but not experienced BF, she exclusively pumped and bottle feed for 2 months for her first baby due to a difficult latch. Mom is also having trouble latching this baby on due to flat nipples, he is at 4% weight loss. When assisting with hand expression noticed that her tissue was semi-compressible. Even though she has Hx of hypothyroidism, she reported an oversupply with her first baby. Mom started double pumping yesterday and already getting 65 ml of EBM per pumping session. She was also set up with shells and a NS #20 by RN Darleene Cleaver but baby still not able to latch. Mom has an Coleman at home.  Offered assistance with latch and mom agreed to wake baby up to feed, LC changed a very wet diaper, documented in Flowsheets. Took baby STS to mother's left breast in football position but baby would not latch. LC did suck training and he would barely open his mouth, he had a very tight grip with lots of biting, it took several minutes for baby to get into a sucking pattern that wasn't even rhythmical but with some uncoordinated tongue movement. Took him back to the breast in football position and she shortly fell asleep. Documented attempt in Flowsheets.  Mom showed LC the bottles she pumped and she also said she was concerned because baby wasn't taking bottles. Showed parents the pace feeding technique and baby was able to take 20 ml in two consecutive sittings, parents were very pleased. Reviewed normal newborn behavior, cluster feeding, feeding cues, pumping schedule, supplementation guidelines, lactogenesis II and breastmilk storage guidelines.  Feeding plan:  1. Encouraged mom to keep putting baby to the breast 8-12 times/24  hours or sooner if feeding cues are present using NS # 20 PRN 2. Mom will pump every 3 hours after feedings, at least 6- times times/24 hours (she may due less due to Hx of oversupply and the fact that she's getting copious volume already 3. Parents will continue supplementing baby with EBM according to supplementation guidelines per baby's age per hours  BF brochure, BF resources and feeding diary were reviewed. Parents reported all questions and concerns were answered, they're both aware of [place] OP services and will call PRN.  Maternal Data Formula Feeding for Exclusion: No Has patient been taught Hand Expression?: Yes Does the patient have breastfeeding experience prior to this delivery?: Yes  Feeding Feeding Type: Breast Milk  LATCH Score Latch: Repeated attempts needed to sustain latch, nipple held in mouth throughout feeding, stimulation needed to elicit sucking reflex.  Audible Swallowing: None  Type of Nipple: Flat  Comfort (Breast/Nipple): Soft / non-tender  Hold (Positioning): Full assist, staff holds infant at breast  LATCH Score: 4  Interventions Interventions: Breast feeding basics reviewed;Assisted with latch;Skin to skin;Breast massage;Hand express;Breast compression;DEBP;Shells;Support pillows  Lactation Tools Discussed/Used Tools: Nipple [place] Fuel;Bottle Nipple shield size: 20 Shell Type: Inverted Breast pump type: Double-Electric Breast Pump WIC Program: No Pump Review: Setup, frequency, and cleaning;Milk Storage Initiated by:: RN Devin and [place] Date initiated:: 02/08/19   Consult Status Consult Status: Follow-up Date: 02/10/19 Follow-up type: In-patient    [place] 02/09/2019, 1:44 PM

## 2019-02-09 NOTE — Lactation Note (Signed)
This note was copied from a baby's chart. Lactation Consultation Note  Patient Name: Boy Monice Lundy [id number] Date: 02/09/2019  P2, 36 hour female infant. LC entered the room mom and infant asleep.    Maternal Data    Feeding Feeding Type: Breast Milk  LATCH Score                   Interventions    Lactation Tools Discussed/Used     Consult Status      Vicente Serene 02/09/2019, 4:58 AM

## 2019-02-10 MED ORDER — OXYCODONE HCL 5 MG PO TABS: 5.0000 mg | ORAL_TABLET | ORAL | 0 refills | Status: DC | PRN

## 2019-02-10 MED ORDER — ACETAMINOPHEN 325 MG PO TABS: 650.0000 mg | ORAL_TABLET | Freq: Four times a day (QID) | ORAL | Status: DC | PRN

## 2019-02-10 NOTE — Lactation Note (Signed)
This note was copied from a baby's chart. Lactation Consultation Note  Patient Name: Boy Tye Juarez [id number] Date: 02/10/2019 Reason for consult: Follow-up assessment  P2 mother whose infant is now 1 hours old.  Mother desires to pump and bottle feed only.  This was her feeding plan with her first child.  Mother was resting when I arrived.  She has been pumping and obtaining good milk volumes.  She is using the slow flow nipple for supplementation.  Mother informed me that pumping "hurts" a little bit.  Reminded her that pumping should not be painful and I would appreciate the opportunity to observe the next pumping session to help determine the issue. Discussed suction pressure and flange size.   Mother will call when she is ready to pump again.    Per mother, nipples are intact.  Provided comfort gels with instructions for use.  Put them in the refrigerator to chill until the next pumping session.  Mother has a manual pump and a DEBP for home use.  Engorgement prevention/treatment reviewed.  Father present.  RN updated.  Family is looking forward to being discharged home after baby's circumcision today.     Maternal Data    Feeding    LATCH Score                   Interventions    Lactation Tools Discussed/Used     Consult Status Consult Status: Complete Date: 02/10/19 Follow-up type: In-patient    Jamoni Broadfoot R Charbel Los 02/10/2019, 10:28 AM

## 2019-02-10 NOTE — Lactation Note (Signed)
This note was copied from a baby's chart. Lactation Consultation Note  Patient Name: Gloria Lyons [id number] Date: 02/10/2019 Reason for consult: Follow-up assessment  LC Follow Up Visit:  Attempted to visit with mother for the second time, however, she was asleep.  Will return later; have spoken with RN about mother's feeding plan.   Maternal Data    Feeding    LATCH Score                   Interventions    Lactation Tools Discussed/Used     Consult Status Consult Status: Follow-up Date: 02/10/19 Follow-up type: In-patient    Little Ishikawa 02/10/2019, 9:48 AM

## 2019-02-10 NOTE — Discharge Instructions (Signed)

## 2019-02-14 ENCOUNTER — Other Ambulatory Visit: Payer: Federal, State, Local not specified - PPO | Admitting: Obstetrics and Gynecology

## 2019-02-15 ENCOUNTER — Telehealth (INDEPENDENT_AMBULATORY_CARE_PROVIDER_SITE_OTHER): Payer: Federal, State, Local not specified - PPO

## 2019-02-15 ENCOUNTER — Other Ambulatory Visit: Payer: Federal, State, Local not specified - PPO

## 2019-02-15 ENCOUNTER — Other Ambulatory Visit: Payer: Self-pay

## 2019-02-15 VITALS — BP 140/86 | HR 85 | Ht 65.0 in | Wt 223.8 lb

## 2019-02-15 DIAGNOSIS — Z0130 Encounter for examination of blood pressure without abnormal findings: Secondary | ICD-10-CM | POA: Insufficient documentation

## 2019-02-15 DIAGNOSIS — Z0000 Encounter for general adult medical examination without abnormal findings: Secondary | ICD-10-CM | POA: Insufficient documentation

## 2019-02-15 HISTORY — DX: Encounter for general adult medical examination without abnormal findings: Z00.00

## 2019-02-15 NOTE — Progress Notes (Signed)
Pt for blood pressure check 140/86 pulse 85. B/p rechecked 133/88. Spoke kim booker about reading. Advised take blood pressure daily. If B/P greater 140/90 call office. Appointment pp visit 03-15-19. Having no problem. Pad CMA

## 2019-02-18 ENCOUNTER — Other Ambulatory Visit (HOSPITAL_COMMUNITY)
Admission: RE | Admit: 2019-02-18 | Discharge: 2019-02-18 | Disposition: A | Discharge status: Home / Self Care | Payer: Federal, State, Local not specified - PPO | Source: Ambulatory Visit

## 2019-02-20 ENCOUNTER — Encounter (HOSPITAL_COMMUNITY): Payer: Federal, State, Local not specified - PPO

## 2019-03-14 ENCOUNTER — Encounter: Payer: Self-pay | Admitting: *Deleted

## 2019-03-15 ENCOUNTER — Ambulatory Visit (INDEPENDENT_AMBULATORY_CARE_PROVIDER_SITE_OTHER): Payer: Federal, State, Local not specified - PPO | Admitting: Women's Health

## 2019-03-15 ENCOUNTER — Other Ambulatory Visit: Payer: Self-pay

## 2019-03-15 ENCOUNTER — Encounter: Payer: Self-pay | Admitting: Women's Health

## 2019-03-15 NOTE — Patient Instructions (Signed)
Hydrocortisone for your back Dial or Dove for genital area cleaning, warm compresses

## 2019-03-15 NOTE — Progress Notes (Signed)
POSTPARTUM VISIT Patient name: Gloria Lyons MRN [id number]  Date of birth: [birth year] Chief Complaint:   postpartum visit  History of Present Illness:   Gloria Lyons is a 28 y.o. G29P2002 Caucasian female being seen today for a postpartum visit. She is 5 weeks postpartum following a spontaneous vaginal delivery at 39.2 gestational weeks after IOL for GHTN. Anesthesia: epidural. Laceration: none. I have fully reviewed the prenatal and intrapartum course. BTL on 02/08/19. Pregnancy complicated by GHTN. Breaking out on back, hasn't tried anything. 'Sebacceous cysts' vulva Postpartum course has been uncomplicated. Bleeding no bleeding. Bowel function is normal. Bladder function is normal.  Patient is sexually active Contraception method is tubal ligation.    Last pap 02/21/17.  Results were normal .  Patient's last menstrual period was 03/21/2018.  Baby's course has been uncomplicated. Baby is feeding by bottle    Edinburgh Postpartum Depression Screening: negative Edinburgh Postnatal Depression Scale - 03/15/19 1028      Edinburgh Postnatal Depression Scale:  In the Past 7 Days   I have been able to laugh and see the funny side of things.  0    I have looked forward with enjoyment to things.  0    I have blamed myself unnecessarily when things went wrong.  1    I have been anxious or worried for no good reason.  0    I have felt scared or panicky for no good reason.  0    Things have been getting on top of me.  1    I have been so unhappy that I have had difficulty sleeping.  0    I have felt sad or miserable.  1    I have been so unhappy that I have been crying.  1    The thought of harming myself has occurred to me.  0    Edinburgh Postnatal Depression Scale Total  4      Review of Systems:   Pertinent items are noted in HPI Denies Abnormal vaginal discharge w/ itching/odor/irritation, headaches, visual changes, shortness of breath, chest pain, abdominal pain, severe  nausea/vomiting, or problems with urination or bowel movements. Pertinent History Reviewed:  Reviewed past medical,surgical, obstetrical and family history.  Reviewed problem list, medications and allergies. OB History  Gravida Para Term Preterm AB Living  2 2 2     2   SAB TAB Ectopic Multiple Live Births        0 2    # Outcome Date GA Lbr Len/2nd Weight Sex Delivery Anes PTL Lv  2 Term 02/08/19 [redacted]w[redacted]d 03:01 / 00:05 6 lb 8.9 oz (2.975 kg) M Vag-Spont EPI  LIV     Birth Comments: wnl  1 Term 08/25/17 [redacted]w[redacted]d 03:16 / 00:31 8 lb 12.2 oz (3.975 kg) F Vag-Spont EPI  LIV   Physical Assessment:   Vitals:   03/15/19 1025  BP: 111/76  Pulse: 83  Weight: 221 lb (100.2 kg)  Height: 5\' 5"  (1.651 m)  Body mass index is 36.78 kg/m.       Physical Examination:   General appearance: alert, well appearing, and in no distress  Mental status: alert, oriented to person, place, and time  Skin: warm & dry   Cardiovascular: normal heart rate noted   Respiratory: normal respiratory effort, no distress   Back: fine diffusely scattered red bumps/rash all over back/upper buttocks  Breasts: deferred, no complaints   Abdomen: soft, non-tender, umbilical BTL incision well healed, glue remnants removed,  one small suture sticking up- cut  Pelvic: VULVA: small bump underneath skin Rt labia majora, not well circumscribed like cyst, possible developing boil. Slightly tender, no erythema  Rectal: not examined   Extremities: no edema       No results found for this or any previous visit (from the past 24 hour(s)).  Assessment & Plan:  1) Postpartum exam 2) 5 wks s/p SVB and BTL 3) Bottlefeeding 4) Depression screening 5) Vulvar developing boil vs cyst> wash w/ Dial or Dove, warm compresses, let us know if worsening or s/s infection 6) Fine rash back> try hydrocortisone cream BID  Meds: No orders of the defined types were placed in this encounter.   Follow-up: Return in about 1 year (around 03/14/2020) for  Pap & physical.   No orders of the defined types were placed in this encounter.   Cheral Marker CNM, [facility] 03/15/2019 11:12 AM

## 2020-06-15 DIAGNOSIS — R509 Fever, unspecified: Secondary | ICD-10-CM | POA: Diagnosis not present

## 2020-06-15 DIAGNOSIS — B349 Viral infection, unspecified: Secondary | ICD-10-CM | POA: Diagnosis not present

## 2020-06-15 DIAGNOSIS — Z20822 Contact with and (suspected) exposure to covid-19: Secondary | ICD-10-CM | POA: Diagnosis not present

## 2020-06-17 DIAGNOSIS — Z20822 Contact with and (suspected) exposure to covid-19: Secondary | ICD-10-CM | POA: Diagnosis not present

## 2020-09-10 ENCOUNTER — Ambulatory Visit
Admission: EM | Admit: 2020-09-10 | Discharge: 2020-09-10 | Disposition: A | Discharge status: Home / Self Care | Payer: Federal, State, Local not specified - PPO | Attending: Internal Medicine | Admitting: Internal Medicine

## 2020-09-10 ENCOUNTER — Encounter: Payer: Self-pay | Admitting: Emergency Medicine

## 2020-09-10 ENCOUNTER — Other Ambulatory Visit: Payer: Self-pay

## 2020-09-10 DIAGNOSIS — R112 Nausea with vomiting, unspecified: Secondary | ICD-10-CM

## 2020-09-10 DIAGNOSIS — K2970 Gastritis, unspecified, without bleeding: Secondary | ICD-10-CM

## 2020-09-10 DIAGNOSIS — K2990 Gastroduodenitis, unspecified, without bleeding: Secondary | ICD-10-CM | POA: Diagnosis not present

## 2020-09-10 MED ORDER — ONDANSETRON 8 MG PO TBDP: 8.0000 mg | ORAL_TABLET | Freq: Three times a day (TID) | ORAL | 0 refills | Status: DC | PRN

## 2020-09-10 MED ORDER — OMEPRAZOLE 20 MG PO CPDR: 20.0000 mg | DELAYED_RELEASE_CAPSULE | Freq: Every day | ORAL | 0 refills | Status: DC

## 2020-09-10 NOTE — ED Triage Notes (Signed)
N/V/D on Monday and  Tuesday.  No further vomiting or diarrhea but still feels nauseated.  Decreased appetite and thirst.   Has not been able to go to work.

## 2020-09-10 NOTE — ED Provider Notes (Signed)
[facility]    CSN: [id number] Arrival date & time: 09/10/20  0847      History   Chief Complaint No chief complaint on file.   HPI PAMLEA FINDER is a 30 y.o. female who presents because she had N/V/D 4 days ago which lasted x 2 days. She thought is was food poisoning since she ate out the day this started. They have resolved except the nausea is still present, had decreased appetite and feels thristy. Has not been able to go to work. She has been trying to push herself to drink. Is able to take.  Had Covid 06/2020.  LMP 1 week which was normal. Has had a tubal.  Denies blood in stool or coffee ground matter in vomit. Has been having indigestion.    Past Medical History:  Diagnosis Date  . Hypothyroidism    stated several years ago and does not take meds for it    Patient Active Problem List   Diagnosis Date Noted  . H/O preeclampsia 08/24/2017  . Hypothyroidism     Past Surgical History:  Procedure Laterality Date  . NO PAST SURGERIES    . TUBAL LIGATION N/A 02/08/2019   Procedure: POST PARTUM TUBAL LIGATION;  Surgeon: Lesly Dukes, MD;  Location: [facility]S;  Service: Gynecology;  Laterality: N/A;    OB History    Gravida  2   Para  2   Term  2   Preterm      AB      Living  2     SAB      IAB      Ectopic      Multiple  0   Live Births  2            Home Medications    Prior to Admission medications   Medication Sig Start Date End Date Taking? Authorizing Provider  omeprazole (PRILOSEC) 20 MG capsule Take 1 capsule (20 mg total) by mouth daily. 09/10/20  Yes Rodriguez-Southworth, Nettie Elm, PA-C  ondansetron (ZOFRAN ODT) 8 MG disintegrating tablet Take 1 tablet (8 mg total) by mouth every 8 (eight) hours as needed for nausea or vomiting. 09/10/20  Yes Rodriguez-Southworth, Nettie Elm, PA-C  Blood Pressure Monitor MISC For regular home bp monitoring during pregnancy 10/25/18   Cheral Marker, CNM  Multiple Vitamin (MULTIVITAMIN)  tablet Take 1 tablet by mouth daily.    [provider]    Family History Family History  Problem Relation Age of Onset  . Cancer Paternal Grandfather   . Diabetes Paternal Grandfather   . Cancer Maternal Grandmother   . Diabetes Maternal Grandmother   . Diabetes Maternal Grandfather   . Leukemia Father   . Hypertension Father   . Heart attack Mother   . Seizures Mother   . Hypertension Mother   . Hypertension Paternal Aunt   . Hypertension Paternal Aunt     Social History Social History   Tobacco Use  . Smoking status: Passive Smoke Exposure - Never Smoker  . Smokeless tobacco: Never Used  Vaping Use  . Vaping Use: Never used  Substance Use Topics  . Alcohol use: Yes    Comment: occ  . Drug use: No     Allergies   Patient has no known allergies.   Review of Systems Review of Systems  Constitutional: Positive for appetite change, diaphoresis and fatigue. Negative for activity change, chills and fever.  HENT: Negative for congestion.   Respiratory: Negative  for cough.   Gastrointestinal: Positive for abdominal pain and nausea. Negative for diarrhea and vomiting.  Musculoskeletal: Negative for myalgias.  Skin: Negative for rash.  Neurological: Negative for headaches.  Hematological: Negative for adenopathy.     Physical Exam Triage Vital Signs ED Triage Vitals  Enc Vitals Group     BP 09/10/20 0924 (!) 131/94     Pulse Rate 09/10/20 0924 (!) 105     Resp 09/10/20 0924 18     Temp 09/10/20 0924 99.1 F (37.3 C)     Temp Source 09/10/20 0924 Oral     SpO2 09/10/20 0924 96 %     Weight --      Height --      Head Circumference --      Peak Flow --      Pain Score 09/10/20 0922 0     Pain Loc --      Pain Edu? --      Excl. in GC? --    No data found.  Updated Vital Signs BP (!) 131/94 (BP Location: Right Arm)   Pulse (!) 105   Temp 99.1 F (37.3 C) (Oral)   Resp 18   LMP 08/26/2020   SpO2 96%   Visual Acuity Right Eye Distance:    Left Eye Distance:   Bilateral Distance:    Right Eye Near:   Left Eye Near:    Bilateral Near:     Physical Exam Constitutional:      General: She is not in acute distress.    Appearance: She is obese. She is not toxic-appearing.  HENT:     Head: Normocephalic.     Right Ear: External ear normal.     Left Ear: External ear normal.  Eyes:     General: No scleral icterus.    Conjunctiva/sclera: Conjunctivae normal.  Pulmonary:     Effort: Pulmonary effort is normal.  Abdominal:     General: Bowel sounds are normal.     Palpations: Abdomen is soft.     Tenderness: There is abdominal tenderness. There is no guarding or rebound.     Comments: On epigastric area  Musculoskeletal:        General: Normal range of motion.     Cervical back: Neck supple.  Skin:    General: Skin is warm and dry.     Findings: No rash.  Neurological:     Mental Status: She is alert and oriented to person, place, and time.     Gait: Gait normal.  Psychiatric:        Mood and Affect: Mood normal.        Behavior: Behavior normal.        Thought Content: Thought content normal.        Judgment: Judgment normal.      UC Treatments / Results  Labs (all labs ordered are listed, but only abnormal results are displayed) Labs Reviewed - No data to display  EKG   Radiology No results found.  Procedures Procedures (including critical care time)  Medications Ordered in UC Medications - No data to display  Initial Impression / Assessment and Plan / UC Course  I have reviewed the triage vital signs and the nursing notes. Unresolved nausea and possibly gastritis. I placed her on Zofran and Prilosec as noted. Needs to Fu with PCP in a couple of weeks to make sure the epigastric pain has resolved.  Final Clinical Impressions(s) / UC Diagnoses  Final diagnoses:  Non-intractable vomiting with nausea, unspecified vomiting type  Gastritis and gastroduodenitis   Discharge Instructions   None     ED Prescriptions    Medication Sig Dispense Auth. Provider   ondansetron (ZOFRAN ODT) 8 MG disintegrating tablet Take 1 tablet (8 mg total) by mouth every 8 (eight) hours as needed for nausea or vomiting. 20 tablet Rodriguez-Southworth, Nettie Elm, PA-C   omeprazole (PRILOSEC) 20 MG capsule Take 1 capsule (20 mg total) by mouth daily. 15 capsule Rodriguez-Southworth, Nettie Elm, PA-C     PDMP not reviewed this encounter.   Garey Ham, PA-C 09/10/20 1005

## 2021-07-01 ENCOUNTER — Encounter (HOSPITAL_BASED_OUTPATIENT_CLINIC_OR_DEPARTMENT_OTHER): Payer: Self-pay | Admitting: Nurse Practitioner

## 2021-07-01 ENCOUNTER — Other Ambulatory Visit: Payer: Self-pay

## 2021-07-01 ENCOUNTER — Other Ambulatory Visit (HOSPITAL_BASED_OUTPATIENT_CLINIC_OR_DEPARTMENT_OTHER): Payer: Self-pay | Admitting: Nurse Practitioner

## 2021-07-01 ENCOUNTER — Ambulatory Visit (HOSPITAL_BASED_OUTPATIENT_CLINIC_OR_DEPARTMENT_OTHER): Payer: Federal, State, Local not specified - PPO | Admitting: Nurse Practitioner

## 2021-07-01 VITALS — BP 120/84 | HR 81 | Resp 14 | Ht 65.0 in | Wt 259.0 lb

## 2021-07-01 DIAGNOSIS — M25561 Pain in right knee: Secondary | ICD-10-CM | POA: Diagnosis not present

## 2021-07-01 DIAGNOSIS — R635 Abnormal weight gain: Secondary | ICD-10-CM | POA: Insufficient documentation

## 2021-07-01 DIAGNOSIS — F902 Attention-deficit hyperactivity disorder, combined type: Secondary | ICD-10-CM | POA: Insufficient documentation

## 2021-07-01 DIAGNOSIS — Z6841 Body Mass Index (BMI) 40.0 and over, adult: Secondary | ICD-10-CM

## 2021-07-01 DIAGNOSIS — M25562 Pain in left knee: Secondary | ICD-10-CM | POA: Diagnosis not present

## 2021-07-01 DIAGNOSIS — G8929 Other chronic pain: Secondary | ICD-10-CM | POA: Insufficient documentation

## 2021-07-01 DIAGNOSIS — E039 Hypothyroidism, unspecified: Secondary | ICD-10-CM | POA: Diagnosis not present

## 2021-07-01 DIAGNOSIS — R41840 Attention and concentration deficit: Secondary | ICD-10-CM

## 2021-07-01 DIAGNOSIS — Z8759 Personal history of other complications of pregnancy, childbirth and the puerperium: Secondary | ICD-10-CM

## 2021-07-01 DIAGNOSIS — O09299 Supervision of pregnancy with other poor reproductive or obstetric history, unspecified trimester: Secondary | ICD-10-CM | POA: Diagnosis not present

## 2021-07-01 HISTORY — DX: Other chronic pain: G89.29

## 2021-07-01 HISTORY — DX: Abnormal weight gain: R63.5

## 2021-07-01 MED ORDER — OZEMPIC (0.25 OR 0.5 MG/DOSE) 2 MG/1.5ML ~~LOC~~ SOPN: PEN_INJECTOR | SUBCUTANEOUS | 0 refills | Status: AC

## 2021-07-01 NOTE — Patient Instructions (Addendum)
Thank you for choosing [place] at [facility] for your Primary Care needs. I am excited for the opportunity to partner with you to meet your health care goals. It was a pleasure meeting you today!  Recommendations from today's visit: We will get the labs and work to get authorization for the [place]. If there are any issues we will call and discuss.  Judson Roch will set up a time for you to see Dr. Burnard Bunting for your knees.  We will send the referral for the attention specialist to evaluate  Information on diet, exercise, and health maintenance recommendations are listed below. This is information to help you be sure you are on track for optimal health and monitoring.   Please look over this and let us know if you have any questions or if you have completed any of the health maintenance outside of [place] so that we can be sure your records are up to date.  ___________________________________________________________ About Me: I am an Adult-Geriatric Nurse Practitioner with a background in caring for patients for more than 20 years with a strong intensive care background. I provide primary care and sports medicine services to patients age 29 and older within this office. My education had a strong focus on caring for the older adult population, which I am passionate about. I am also the director of the APP Fellowship with [facility].   My desire is to provide you with the best service through preventive medicine and supportive care. I consider you a part of the medical team and value your input. I work diligently to ensure that you are heard and your needs are met in a safe and effective manner. I want you to feel comfortable with me as your provider and want you to know that your health concerns are important to me.  For your information, our office hours are: Monday, Tuesday, and Thursday 8:00 AM - 5:00 PM Wednesday and Friday 8:00 AM - 12:00 PM.   In my time away from the  office I am teaching new APP's within the system and am unavailable, but my partner, Dr. Burnard Bunting is in the office for emergent needs.   If you have questions or concerns, please call our office at [phone] or send [place] a MyChart message and we will respond as quickly as possible.  ____________________________________________________________ MyChart:  For all urgent or time sensitive needs we ask that you please call the office to avoid delays. Our number is [phone]. MyChart is not constantly monitored and due to the large volume of messages a day, replies may take up to 72 business hours.  MyChart Policy: MyChart allows for you to see your visit notes, after visit summary, provider recommendations, lab and tests results, make an appointment, request refills, and contact your provider or the office for non-urgent questions or concerns. Providers are seeing patients during normal business hours and do not have built in time to review MyChart messages.  We ask that you allow a minimum of 3 business days for responses to [employer]. For this reason, please do not send urgent requests through [place]. Please call the office at [phone]. New and ongoing conditions may require a visit. We have virtual and in person visit available for your convenience.  Complex MyChart concerns may require a visit. Your provider may request you schedule a virtual or in person visit to ensure we are providing the best care possible. MyChart messages sent after 11:00 AM on Friday will not be  received by the provider until Monday morning.    Lab and Test Results: You will receive your lab and test results on MyChart as soon as they are completed and results have been sent by the lab or testing facility. Due to this service, you will receive your results BEFORE your provider.  I review lab and tests results each morning prior to seeing patients. Some results require collaboration with other providers to  ensure you are receiving the most appropriate care. For this reason, we ask that you please allow a minimum of 3-5 business days from the time the ALL results have been received for your provider to receive and review lab and test results and contact you about these.  Most lab and test result comments from the provider will be sent through [place]. Your provider may recommend changes to the plan of care, follow-up visits, repeat testing, ask questions, or request an office visit to discuss these results. You may reply directly to this message or call the office at [phone] to provide information for the provider or set up an appointment. In some instances, you will be called with test results and recommendations. Please let us know if this is preferred and we will make note of this in your chart to provide this for you.    If you have not heard a response to your lab or test results in 5 business days from all results returning to [place], please call the office to let us know. We ask that you please avoid calling prior to this time unless there is an emergent concern. Due to high call volumes, this can delay the resulting process.  After Hours: For all non-emergency after hours needs, please call the office at [phone] and select the option to reach the on-call provider service. On-call services are shared between multiple [place] offices and therefore it will not be possible to speak directly with your provider. On-call providers may provide medical advice and recommendations, but are unable to provide refills for maintenance medications.  For all emergency or urgent medical needs after normal business hours, we recommend that you seek care at the closest Urgent Care or Emergency Department to ensure appropriate treatment in a timely manner.  [facility] has a 24 hour emergency room located on the ground floor for your convenience.   Urgent Concerns During the Business  Day Providers are seeing patients from 8AM to [place] with a busy schedule and are most often not able to respond to non-urgent calls until the end of the day or the next business day. If you should have URGENT concerns during the day, please call and speak to the nurse or schedule a same day appointment so that we can address your concern without delay.   Thank you, again, for choosing me as your health care partner. I appreciate your trust and look forward to learning more about you.   Worthy Keeler, DNP, AGNP-c ___________________________________________________________  Health Maintenance Recommendations Screening Testing Mammogram Every 1 -2 years based on history and risk factors Starting at age 81 Pap Smear Ages 21-39 every 3 years Ages 7-65 every 5 years with HPV testing More frequent testing may be required based on results and history Colon Cancer Screening Every 1-10 years based on test performed, risk factors, and history Starting at age 37 Bone Density Screening Every 2-10 years based on history Starting at age 46 for women Recommendations for men differ based on medication usage, history, and risk factors AAA  Screening One time ultrasound Men 12-55 years old who have every smoked Lung Cancer Screening Low Dose Lung CT every 12 months Age 31-80 years with a 30 pack-year smoking history who still smoke or who have quit within the last 15 years  Screening Labs Routine  Labs: Complete Blood Count (CBC), Complete Metabolic Panel (CMP), Cholesterol (Lipid Panel) Every 6-12 months based on history and medications May be recommended more frequently based on current conditions or previous results Hemoglobin A1c Lab Every 3-12 months based on history and previous results Starting at age 62 or earlier with diagnosis of diabetes, high cholesterol, BMI >26, and/or risk factors Frequent monitoring for patients with diabetes to ensure blood sugar control Thyroid Panel (TSH w/ T3  & T4) Every 6 months based on history, symptoms, and risk factors May be repeated more often if on medication HIV One time testing for all patients 27 and older May be repeated more frequently for patients with increased risk factors or exposure Hepatitis C One time testing for all patients 61 and older May be repeated more frequently for patients with increased risk factors or exposure Gonorrhea, Chlamydia Every 12 months for all sexually active persons 13-24 years Additional monitoring may be recommended for those who are considered high risk or who have symptoms PSA Men 19-43 years old with risk factors Additional screening may be recommended from age 43-69 based on risk factors, symptoms, and history  Vaccine Recommendations Tetanus Booster All adults every 10 years Flu Vaccine All patients 6 months and older every year COVID Vaccine All patients 12 years and older Initial dosing with booster May recommend additional booster based on age and health history HPV Vaccine 2 doses all patients age 49-26 Dosing may be considered for patients over 26 Shingles Vaccine (Shingrix) 2 doses all adults 25 years and older Pneumonia (Pneumovax 23) All adults 62 years and older May recommend earlier dosing based on health history Pneumonia (Prevnar 35) All adults 65 years and older Dosed 1 year after Pneumovax 23  Additional Screening, Testing, and Vaccinations may be recommended on an individualized basis based on family history, health history, risk factors, and/or exposure.  __________________________________________________________  Diet Recommendations for All Patients  I recommend that all patients maintain a diet low in saturated fats, carbohydrates, and cholesterol. While this can be challenging at first, it is not impossible and small changes can make big differences.  Things to try: Decreasing the amount of soda, sweet tea, and/or juice to one or less per day and replace with  water While water is always the first choice, if you do not like water you may consider adding a water additive without sugar to improve the taste other sugar free drinks Replace potatoes with a brightly colored vegetable at dinner Use healthy oils, such as canola oil or olive oil, instead of butter or hard margarine Limit your bread intake to two pieces or less a day Replace regular pasta with low carb pasta options Bake, broil, or grill foods instead of frying Monitor portion sizes  Eat smaller, more frequent meals throughout the day instead of large meals  An important thing to remember is, if you love foods that are not great for your health, you don't have to give them up completely. Instead, allow these foods to be a reward when you have done well. Allowing yourself to still have special treats every once in a while is a nice way to tell yourself thank you for working hard to keep yourself healthy.  Also remember that every day is a new day. If you have a bad day and "fall off the wagon", you can still climb right back up and keep moving along on your journey!  We have resources available to help you!  Some websites that may be helpful include: www.http://carter.biz/  Www.VeryWellFit.com _____________________________________________________________  Activity Recommendations for All Patients  I recommend that all adults get at least 20 minutes of moderate physical activity that elevates your heart rate at least 5 days out of the week.  Some examples include: Walking or jogging at a pace that allows you to carry on a conversation Cycling (stationary bike or outdoors) Water aerobics Yoga Weight lifting Dancing If physical limitations prevent you from putting stress on your joints, exercise in a pool or seated in a chair are excellent options.  Do determine your MAXIMUM heart rate for activity: YOUR AGE - 220 = MAX HeartRate   Remember! Do not push yourself too hard.  Start slowly  and build up your pace, speed, weight, time in exercise, etc.  Allow your body to rest between exercise and get good sleep. You will need more water than normal when you are exerting yourself. Do not wait until you are thirsty to drink. Drink with a purpose of getting in at least 8, 8 ounce glasses of water a day plus more depending on how much you exercise and sweat.    If you begin to develop dizziness, chest pain, abdominal pain, jaw pain, shortness of breath, headache, vision changes, lightheadedness, or other concerning symptoms, stop the activity and allow your body to rest. If your symptoms are severe, seek emergency evaluation immediately. If your symptoms are concerning, but not severe, please let us know so that we can recommend further evaluation.

## 2021-07-01 NOTE — Assessment & Plan Note (Signed)
Chronic concerns with weight gain and fluctuations in weight Would like to obtain labs today for further evaluation before recommending bariatric surgery. Discussed etiology of weight gain and common conditions that can cause difficulty with weight maintenance including insulin resistance. Patient is interested in trialing semaglutide to see if this would be helpful for her reaching her long-term weight loss goals. Patient does have risk factors of elevated BMI, elevated blood sugars on labs, family history of cardiovascular disease. Will obtain labs today and evaluate further risks. Medication sent to pharmacy.  Patient instructed that this may need a prior authorization and we will notify if alternative medications are required first. Discussed with patient if she is unable to tolerate the medication or if this medication is not effective for her we could always consider bariatric surgery as a second option.  She is agreeable to this today. We will plan to follow-up in approximately 2 months for weight.

## 2021-07-01 NOTE — Assessment & Plan Note (Signed)
Attention and concentration deficit reported. She has not had any testing for this in the past. It is unclear if deficit is related to true attention deficit disorder or possible stress related reactions in the setting of being the mother of 2 young children and running household. We will send her for evaluation with neuropsychiatry and determine recommendations based off of those findings.

## 2021-07-01 NOTE — Assessment & Plan Note (Signed)
>>  ASSESSMENT AND PLAN FOR ATTENTION AND CONCENTRATION DEFICIT WRITTEN ON 07/01/2021  7:57 PM BY Mickala Laton E, NP  Attention and concentration deficit reported. She has not had any testing for this in the past. It is unclear if deficit is related to true attention deficit disorder or possible stress related reactions in the setting of being the mother of 2 young children and running household. We will send her for evaluation with neuropsychiatry and determine recommendations based off of those findings.

## 2021-07-01 NOTE — Assessment & Plan Note (Signed)
Hypothyroidism historically on labs.  Not currently on any medications for this. We will obtain labs today for evaluation and also add labs to look for antibodies as she has had a history of normal and abnormal labs. Will determine follow-up based on laboratory results.

## 2021-07-01 NOTE — Progress Notes (Signed)
Orma Render, DNP, AGNP-c [facility] [address]   [place], [place] [address] [phone]  New patient visit   Patient: Gloria Lyons   DOB: Feb 24, 1991   31 y.o. Female  MRN: [id number] Visit Date: 07/01/2021  Patient Care Team: Chantalle Defilippo, Coralee Pesa, NP as PCP - General (Nurse Practitioner)  Today's healthcare provider: Orma Render, NP   Chief Complaint  Patient presents with   Establish Care    Patient has not had consisited PCP in the last 3 years   Weight management    Patient is thinking of having gastric sleeve surgery and would like to discuss   Subjective    HPI HPI     Establish Care    Additional comments: Patient has not had consisited PCP in the last 3 years        Weight management    Additional comments: Patient is thinking of having gastric sleeve surgery and would like to discuss      Last edited by Bobby Rumpf, [place] on 07/01/2021  4:05 PM.      Gloria Lyons is a 31 y.o. female who presents today as a new patient to establish care.  Gloria Lyons endorses concerns today with knee pain, weight, and attention deficit symptoms.  Knee pain She endorses bilateral knee pain that has been ongoing since adolescence.  She tells me her pain started in the right knee however now the pain is more severe in the left greater than the right. She feels the pain is located directly behind the kneecap. She describes the pain as dull and achy at rest with sharp pain with use. She endorses cracking and popping when bending the knees and climbing stairs.  She endorses pain is worse when going downhill or down stairs.  She endorses sharp shooting pain when she puts pressure on the kneecap. She is unsure if there is a weather correlation but feels this may be possible.  She did have an MRI in her 53s and reports that the reading showed "bruising".  At that time she was given a brace and some home exercises to perform.  She  has not tried any PT or injections but would be willing to try these. She denies any warmth, redness, or edema to the knee.  Weight She endorses lifelong difficulties with her weight.  She reports in her adult life her weight has been up and down depending on her dietary habits. She has been able to lose approximately 20 to 30 pounds with very strict dieting however reports as soon as she stops she gains the weight back and additional weight as well.  She tells me last year she lost about 20 pounds however gained that back in an additional 30 pounds shortly after stopping the strict dietary habits. She has looked into bariatric surgery (gastric sleeve) and is interested in this. She has tried phentermine in the past however does not like the way the medication made her feel and did not have much success with weight loss while using this. It has been approximately 3 years since she has had labs done.  She does have a family history of diabetes in her maternal grandfather she is unsure if other family members did have diabetes.  She does have a family history of hypertension in both her mother and father. She is open to ideas other than bariatric surgery today if possible.  Attention Symptoms  She endorses concerning symptoms  with attention and focus. She is married and has 2 small children at home for which she cares for during the day.  She reports that her difficulty focusing and paying attention are primarily related to getting things done around the house and managing the home and the children.  She feels that she is able to manage the very basics but feels like there are too many things to think about and she has difficulty focusing on 1 task at a time. She endorses that she always felt some difficulty focusing in school and would often daydream or not pay close attention.  She did make good grades in school and did not get in trouble.  She has never had an evaluation for ADHD but is interested in  receiving 1 to see if there is something that could help with her attention and focus.  She does have a history of hypothyroidism found on laboratory evaluation.  She tells me that more recent labs have not shown any evidence of hypothyroidism however she is concerned because she does have many symptoms associated with this.  She has not been on any medication for this in the past and is not currently on any medication now.  She endorses positive intermittent palpitations, anxiety symptoms, difficulty focusing/concentrating, weight gain, and thinning hair.  Past Medical History:  Diagnosis Date   Hypothyroidism    stated several years ago and does not take meds for it   Past Surgical History:  Procedure Laterality Date   NO PAST SURGERIES     TUBAL LIGATION N/A 02/08/2019   Procedure: POST PARTUM TUBAL LIGATION;  Surgeon: Guss Bunde, MD;  Location: [facility];  Service: Gynecology;  Laterality: N/A;   Family Status  Relation Name Status   PGF Sonia Side Deceased   Sedan  Deceased   Archer Deceased   MGF Jeneen Rinks Deceased   Father Audry Pili Alive   Mother Pam Alive   Daughter Alinda Sierras Alive   Mat Aunt  Alive   Pat Aunt  Alive   Mat Uncle  Alive   Pat Aunt Danna Alive   Son [employer] Alive   Family History  Problem Relation Age of Onset   Cancer Paternal Grandfather    Diabetes Paternal Grandfather    Cancer Maternal Grandmother    Diabetes Maternal Grandmother    Diabetes Maternal Grandfather    Leukemia Father    Hypertension Father    Alcohol abuse Father    Cancer Father    COPD Father    Depression Father    Heart disease Father    Heart attack Mother    Seizures Mother    Hypertension Mother    Anxiety disorder Mother    Depression Mother    Hypertension Paternal Aunt    Hypertension Paternal Aunt    Social History   Socioeconomic History   Marital status: Married    Spouse name: Jaleisha Baloga   Number of children: 2   Years of education: Not on file   Highest education  level: Not on file  Occupational History   Not on file  Tobacco Use   Smoking status: Passive Smoke Exposure - Never Smoker    Passive exposure: Yes   Smokeless tobacco: Never   Tobacco comments:    Both parents smoke. I never have but I had 25 years of second hand smoke.  Vaping Use   Vaping Use: Never used  Substance and Sexual Activity   Alcohol use: Yes  Comment: Maybe once every 2-3 months   Drug use: No   Sexual activity: Yes    Birth control/protection: Surgical    Comment: tubal  Other Topics Concern   Not on file  Social History Narrative   Not on file   Social Determinants of Health   Financial Resource Strain: Not on file  Food Insecurity: Not on file  Transportation Needs: Not on file  Physical Activity: Not on file  Stress: Not on file  Social Connections: Not on file   Outpatient Medications Prior to Visit  Medication Sig   [DISCONTINUED] Blood Pressure Monitor MISC For regular home bp monitoring during pregnancy   [DISCONTINUED] Multiple Vitamin (MULTIVITAMIN) tablet Take 1 tablet by mouth daily.   [DISCONTINUED] omeprazole (PRILOSEC) 20 MG capsule Take 1 capsule (20 mg total) by mouth daily.   [DISCONTINUED] ondansetron (ZOFRAN ODT) 8 MG disintegrating tablet Take 1 tablet (8 mg total) by mouth every 8 (eight) hours as needed for nausea or vomiting.   No facility-administered medications prior to visit.   No Known Allergies  Immunization History  Administered Date(s) Administered   Influenza,inj,Quad PF,6+ Mos 04/11/2017, 08/27/2018   Tdap 11/22/2018    Health Maintenance  Topic Date Due   COVID-19 Vaccine (1) Never done   Hepatitis C Screening  Never done   PAP SMEAR-Modifier  02/22/2020   INFLUENZA VACCINE  07/26/2021 (Originally 01/11/2021)   TETANUS/TDAP  11/21/2028   HIV Screening  Completed   HPV VACCINES  Aged Out    Patient Care Team: Robecca Fulgham, Coralee Pesa, NP as PCP - General (Nurse Practitioner)  Review of Systems All review of systems  negative except what is listed in the HPI   Objective    BP 120/84    Pulse 81    Resp 14    Ht 5\' 5"  (1.651 m)    Wt 259 lb (117.5 kg)    LMP  (LMP Unknown) Comment: tubal ligation   SpO2 99%    BMI 43.10 kg/m  Physical Exam Vitals and nursing note reviewed.  Constitutional:      General: She is not in acute distress.    Appearance: Normal appearance.  Eyes:     Extraocular Movements: Extraocular movements intact.     Conjunctiva/sclera: Conjunctivae normal.     Pupils: Pupils are equal, round, and reactive to light.  Neck:     Vascular: No carotid bruit.  Cardiovascular:     Rate and Rhythm: Normal rate and regular rhythm.     Pulses: Normal pulses.     Heart sounds: Normal heart sounds. No murmur heard. Pulmonary:     Effort: Pulmonary effort is normal.     Breath sounds: Normal breath sounds. No wheezing.  Abdominal:     General: Bowel sounds are normal.     Palpations: Abdomen is soft.  Musculoskeletal:        General: Tenderness present. Normal range of motion.     Cervical back: Normal range of motion.     Right lower leg: No edema.     Left lower leg: No edema.     Comments: Crepitus noted bilaterally in the knees.  Tenderness with direct pressure on the kneecap.  Skin:    General: Skin is warm and dry.     Capillary Refill: Capillary refill takes less than 2 seconds.  Neurological:     General: No focal deficit present.     Mental Status: She is alert and oriented to person, place, and time.  Psychiatric:        Mood and Affect: Mood normal.        Behavior: Behavior normal.        Thought Content: Thought content normal.        Judgment: Judgment normal.    Depression Screen PHQ 2/9 Scores 07/01/2021 08/27/2018 02/21/2017 01/25/2017  PHQ - 2 Score 0 0 0 0  PHQ- 9 Score - 2 0 -   No results found for any visits on 07/01/21.  Assessment & Plan      Problem List Items Addressed This Visit     Hypothyroidism    Hypothyroidism historically on labs.  Not  currently on any medications for this. We will obtain labs today for evaluation and also add labs to look for antibodies as she has had a history of normal and abnormal labs. Will determine follow-up based on laboratory results.      Relevant Medications   Semaglutide,0.25 or 0.5MG /DOS, (OZEMPIC, 0.25 OR 0.5 MG/DOSE,) 2 MG/1.5ML SOPN   Other Relevant Orders   CBC with Differential/Platelet   Comprehensive metabolic panel   Lipid panel   TSH   VITAMIN D 25 Hydroxy (Vit-D Deficiency, Fractures)   T4   T3   Hemoglobin A1c   Thyroid Peroxidase Antibodies (TPO) (REFL)   H/O preeclampsia    History of preeclampsia in both pregnancies.  Blood pressures are normal today at 120/84. Will continue to monitor blood pressures closely.  She is not planning on having any more children at this time.      Relevant Medications   Semaglutide,0.25 or 0.5MG /DOS, (OZEMPIC, 0.25 OR 0.5 MG/DOSE,) 2 MG/1.5ML SOPN   Weight gain - Primary    Chronic concerns with weight gain and fluctuations in weight Would like to obtain labs today for further evaluation before recommending bariatric surgery. Discussed etiology of weight gain and common conditions that can cause difficulty with weight maintenance including insulin resistance. Patient is interested in trialing semaglutide to see if this would be helpful for her reaching her long-term weight loss goals. Patient does have risk factors of elevated BMI, elevated blood sugars on labs, family history of cardiovascular disease. Will obtain labs today and evaluate further risks. Medication sent to pharmacy.  Patient instructed that this may need a prior authorization and we will notify if alternative medications are required first. Discussed with patient if she is unable to tolerate the medication or if this medication is not effective for her we could always consider bariatric surgery as a second option.  She is agreeable to this today. We will plan to follow-up in  approximately 2 months for weight.      Relevant Medications   Semaglutide,0.25 or 0.5MG /DOS, (OZEMPIC, 0.25 OR 0.5 MG/DOSE,) 2 MG/1.5ML SOPN   Attention and concentration deficit    Attention and concentration deficit reported. She has not had any testing for this in the past. It is unclear if deficit is related to true attention deficit disorder or possible stress related reactions in the setting of being the mother of 2 young children and running household. We will send her for evaluation with neuropsychiatry and determine recommendations based off of those findings.      Chronic pain of both knees    Chronic pain in both knees with crepitus, aching, and sharp shooting pain with pressure. Range of motion and gait intact today.  Crepitus present bilaterally.  No signs of acute injury.  No swelling noted. Recommend evaluation with sports medicine for possible imaging and  therapy options. She will follow-up with Dr. Burnard Bunting at her convenience.        Return for With Dr. Burnard Bunting for knees in near future. 2 months VV for Weight.      Jesseca Marsch, Coralee Pesa, NP, DNP, AGNP-C [facility]

## 2021-07-01 NOTE — Assessment & Plan Note (Signed)
History of preeclampsia in both pregnancies.  Blood pressures are normal today at 120/84. Will continue to monitor blood pressures closely.  She is not planning on having any more children at this time.

## 2021-07-01 NOTE — Assessment & Plan Note (Signed)
Chronic pain in both knees with crepitus, aching, and sharp shooting pain with pressure. Range of motion and gait intact today.  Crepitus present bilaterally.  No signs of acute injury.  No swelling noted. Recommend evaluation with sports medicine for possible imaging and therapy options. She will follow-up with Dr. Ihor Dow at her convenience.

## 2021-07-02 LAB — CBC WITH DIFFERENTIAL/PLATELET
Basophils Absolute: 0 10*3/uL (ref 0.0–0.2)
Basos: 0 %
EOS (ABSOLUTE): 0.3 10*3/uL (ref 0.0–0.4)
Eos: 4 %
Hematocrit: 40.1 % (ref 34.0–46.6)
Hemoglobin: 13.5 g/dL (ref 11.1–15.9)
Immature Grans (Abs): 0 10*3/uL (ref 0.0–0.1)
Immature Granulocytes: 0 %
Lymphocytes Absolute: 1.7 10*3/uL (ref 0.7–3.1)
Lymphs: 23 %
MCH: 28 pg (ref 26.6–33.0)
MCHC: 33.7 g/dL (ref 31.5–35.7)
MCV: 83 fL (ref 79–97)
Monocytes Absolute: 0.6 10*3/uL (ref 0.1–0.9)
Monocytes: 8 %
Neutrophils Absolute: 4.9 10*3/uL (ref 1.4–7.0)
Neutrophils: 65 %
Platelets: 269 10*3/uL (ref 150–450)
RBC: 4.82 x10E6/uL (ref 3.77–5.28)
RDW: 12.6 % (ref 11.7–15.4)
WBC: 7.6 10*3/uL (ref 3.4–10.8)

## 2021-07-02 LAB — LIPID PANEL
Chol/HDL Ratio: 3.5 ratio (ref 0.0–4.4)
Cholesterol, Total: 152 mg/dL (ref 100–199)
HDL: 43 mg/dL (ref >39)
LDL Chol Calc (NIH): 74 mg/dL (ref 0–99)
Triglycerides: 214 mg/dL — ABNORMAL HIGH (ref 0–149)
VLDL Cholesterol Cal: 35 mg/dL (ref 5–40)

## 2021-07-02 LAB — COMPREHENSIVE METABOLIC PANEL
ALT: 37 IU/L — ABNORMAL HIGH (ref 0–32)
AST: 22 IU/L (ref 0–40)
Albumin/Globulin Ratio: 1.7 (ref 1.2–2.2)
Albumin: 4.6 g/dL (ref 3.8–4.8)
Alkaline Phosphatase: 68 IU/L (ref 44–121)
BUN/Creatinine Ratio: 12 (ref 9–23)
BUN: 10 mg/dL (ref 6–20)
Bilirubin Total: 0.2 mg/dL (ref 0.0–1.2)
CO2: 20 mmol/L (ref 20–29)
Calcium: 9.8 mg/dL (ref 8.7–10.2)
Chloride: 104 mmol/L (ref 96–106)
Creatinine, Ser: 0.86 mg/dL (ref 0.57–1.00)
Globulin, Total: 2.7 g/dL (ref 1.5–4.5)
Glucose: 90 mg/dL (ref 70–99)
Potassium: 4.5 mmol/L (ref 3.5–5.2)
Sodium: 142 mmol/L (ref 134–144)
Total Protein: 7.3 g/dL (ref 6.0–8.5)
eGFR: 93 mL/min/{1.73_m2} (ref >59)

## 2021-07-02 LAB — HEMOGLOBIN A1C
Est. average glucose Bld gHb Est-mCnc: 114 mg/dL
Hgb A1c MFr Bld: 5.6 % (ref 4.8–5.6)

## 2021-07-02 LAB — T4: T4, Total: 8.3 ug/dL (ref 4.5–12.0)

## 2021-07-02 LAB — VITAMIN D 25 HYDROXY (VIT D DEFICIENCY, FRACTURES): Vit D, 25-Hydroxy: 28.2 ng/mL — ABNORMAL LOW (ref 30.0–100.0)

## 2021-07-02 LAB — THYROID PEROXIDASE ANTIBODY: Thyroperoxidase Ab SerPl-aCnc: 56 IU/mL — ABNORMAL HIGH (ref 0–34)

## 2021-07-02 LAB — T3: T3, Total: 127 ng/dL (ref 71–180)

## 2021-07-02 LAB — TSH: TSH: 2.38 u[IU]/mL (ref 0.450–4.500)

## 2021-07-08 ENCOUNTER — Encounter (HOSPITAL_BASED_OUTPATIENT_CLINIC_OR_DEPARTMENT_OTHER): Payer: Self-pay | Admitting: Nurse Practitioner

## 2021-07-13 ENCOUNTER — Ambulatory Visit (HOSPITAL_BASED_OUTPATIENT_CLINIC_OR_DEPARTMENT_OTHER): Payer: Federal, State, Local not specified - PPO | Admitting: Family Medicine

## 2021-07-13 ENCOUNTER — Other Ambulatory Visit: Payer: Self-pay

## 2021-07-13 ENCOUNTER — Encounter (HOSPITAL_BASED_OUTPATIENT_CLINIC_OR_DEPARTMENT_OTHER): Payer: Self-pay | Admitting: Family Medicine

## 2021-07-13 DIAGNOSIS — M222X2 Patellofemoral disorders, left knee: Secondary | ICD-10-CM | POA: Diagnosis not present

## 2021-07-13 DIAGNOSIS — M222X1 Patellofemoral disorders, right knee: Secondary | ICD-10-CM | POA: Diagnosis not present

## 2021-07-13 NOTE — Patient Instructions (Signed)
  Medication Instructions:  Your physician recommends that you continue on your current medications as directed. Please refer to the Current Medication list given to you today. --If you need a refill on any your medications before your next appointment, please call your pharmacy first. If no refills are authorized on file call the office.--  Follow-Up: Your next appointment:   Your physician recommends that you schedule a follow-up appointment in: AS NEEDED with Dr. de Cuba      Thanks for letting us be apart of your health journey!!        [facility]   Dr. Raymond de Cuba II., MD, MPH               We recommend signing up for the patient portal called "MyChart".  Sign up information is provided on this After Visit Summary.  MyChart is used to connect with patients for Virtual Visits (Telemedicine).  Patients are able to view lab/test results, encounter notes, upcoming appointments, etc.  Non-urgent messages can be sent to your provider as well.   To learn more about what you can do with MyChart, please visit --  [email or web address].    
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
## 2021-08-11 ENCOUNTER — Ambulatory Visit (HOSPITAL_COMMUNITY): Payer: Federal, State, Local not specified - PPO | Attending: Family Medicine | Admitting: Physical Therapy

## 2021-08-11 ENCOUNTER — Other Ambulatory Visit: Payer: Self-pay

## 2021-08-11 ENCOUNTER — Encounter (HOSPITAL_COMMUNITY): Payer: Self-pay | Admitting: Physical Therapy

## 2021-08-11 DIAGNOSIS — M25562 Pain in left knee: Secondary | ICD-10-CM | POA: Insufficient documentation

## 2021-08-11 DIAGNOSIS — R2689 Other abnormalities of gait and mobility: Secondary | ICD-10-CM | POA: Insufficient documentation

## 2021-08-11 DIAGNOSIS — M25561 Pain in right knee: Secondary | ICD-10-CM | POA: Diagnosis not present

## 2021-08-11 DIAGNOSIS — R29898 Other symptoms and signs involving the musculoskeletal system: Secondary | ICD-10-CM | POA: Insufficient documentation

## 2021-08-11 DIAGNOSIS — M6281 Muscle weakness (generalized): Secondary | ICD-10-CM | POA: Insufficient documentation

## 2021-08-11 DIAGNOSIS — M222X1 Patellofemoral disorders, right knee: Secondary | ICD-10-CM | POA: Diagnosis not present

## 2021-08-11 DIAGNOSIS — M222X2 Patellofemoral disorders, left knee: Secondary | ICD-10-CM | POA: Diagnosis not present

## 2021-08-11 NOTE — Therapy (Signed)
[facility] [address] [place], [place], [address] Phone: [phone]   Fax:  [phone]  Physical Therapy Evaluation  Patient Details  Name: Gloria Lyons MRN: [id number] Date of Birth: 13-Apr-1991 Referring Provider (PT): Arlina Robes Gu[place] MD   Encounter Date: 08/11/2021   PT End of Session - 08/11/21 1436     Visit Number 1    Number of Visits 12    Date for PT Re-Evaluation 09/22/21    Authorization Type BCBS (no auth, 50 VL)    Authorization - Visit Number 1    Authorization - Number of Visits 38    PT Start Time [id number]    PT Stop Time 1525    PT Time Calculation (min) 46 min    Activity Tolerance Patient tolerated treatment well    Behavior During Therapy [facility] for tasks assessed/performed             Past Medical History:  Diagnosis Date   Hypothyroidism    stated several years ago and does not take meds for it    Past Surgical History:  Procedure Laterality Date   NO PAST SURGERIES     TUBAL LIGATION N/A 02/08/2019   Procedure: POST PARTUM TUBAL LIGATION;  Surgeon: Guss Bunde, MD;  Location: [facility] LD [facility];  Service: Gynecology;  Laterality: N/A;    There were no vitals filed for this visit.    Subjective Assessment - 08/11/21 1437     Subjective Patient is a 31 y.o. female who presents to physical therapy with referral for Patellofemoral pain syndrome of both knees. Patient states old injury from volley ball on R knee. Both knees are now bothering her. Her MD told her MD would be best bed. Symptoms worse and has difficulty with stairs, down hill, transfers to/from floor. Some days are worse than others. Symptoms ease with Advil and rest. Patient states main goal is to decrease pain.    Limitations Walking;Lifting;House hold activities    Patient Stated Goals decrease symptoms    Currently in Pain? No/denies                [facility] PT Assessment - 08/11/21 0001       Assessment   Medical Diagnosis PFPS  bilateral    Referring Provider (PT) Arlina Robes [place] MD    Onset Date/Surgical Date 08/11/08    Prior Therapy none      Precautions   Precautions None      Restrictions   Weight Bearing Restrictions No      Balance Screen   Has the patient fallen in the past 6 months No    Has the patient had a decrease in activity level because of a fear of falling?  No    Is the patient reluctant to leave their home because of a fear of falling?  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      Cognition   Overall Cognitive Status Within Functional Limits for tasks assessed      Observation/Other Assessments   Observations Ambulates without AD    Focus on Therapeutic Outcomes (FOTO)  56% function      ROM / Strength   AROM / PROM / Strength AROM;Strength      AROM   AROM Assessment Site Knee    Right/Left Knee Right;Left    Right  Knee Extension 3   hyperextension   Right Knee Flexion 135    Left Knee Extension 3   hyperextension   Left Knee Flexion 128      Strength   Strength Assessment Site Hip;Knee;Ankle    Right/Left Hip Right;Left    Right Hip Flexion 5/5    Right Hip Extension 4+/5    Right Hip ABduction 4+/5    Left Hip Flexion 5/5    Left Hip Extension 4+/5    Left Hip ABduction 4+/5    Right/Left Knee Right;Left    Right Knee Flexion 5/5    Right Knee Extension 5/5    Left Knee Flexion 5/5    Left Knee Extension 5/5    Right/Left Ankle Right;Left    Right Ankle Dorsiflexion 5/5    Left Ankle Dorsiflexion 5/5      Special Tests   Other special tests squat: minimal weight shift off LLE; stairs: WFL alternating, forward step down test: bilateral  contralateral hip drop L>R, decreased eccentric control, intermittent dynamic knee valgus secondary to balance deficits                        Objective measurements completed on examination: See above findings.        [facility] Adult PT Treatment/Exercise - 08/11/21 0001       Exercises   Exercises Knee/Hip      Knee/Hip Exercises: Standing   Functional Squat 10 reps      Knee/Hip Exercises: Seated   Long Arc Quad 10 reps    Long Arc Quad Limitations 10 second holds      Knee/Hip Exercises: Sidelying   Hip ABduction 10 reps;Both      Knee/Hip Exercises: Prone   Hip Extension Both;10 reps                     PT Education - 08/11/21 1436     Education Details Patient educated on exam findings, POC, scope of PT, HEP, footwear/inserts    Person(s) Educated Patient    Methods Explanation;Demonstration;Handout    Comprehension Verbalized understanding;Returned demonstration              PT Short Term Goals - 08/11/21 1528       PT SHORT TERM GOAL #1   Title Patient will be independent with HEP in order to improve functional outcomes.    Time 3    Period Weeks    Status New    Target Date 09/01/21               PT Long Term Goals - 08/11/21 1528       PT LONG TERM GOAL #1   Title Patient will report at least 75% improvement in symptoms for improved quality of life.    Time 6    Period Weeks    Status New    Target Date 09/22/21      PT LONG TERM GOAL #2   Title Patient will improve FOTO score by at least 15 points in order to indicate improved tolerance to activity.    Time 6    Period Weeks    Status New    Target Date 09/22/21      PT LONG TERM GOAL #3   Title Patient will be able to perform forward step down test without deviation in order to demonstrate improved LE strength and motor control.    Time 6    Period  Weeks    Status New    Target Date 09/22/21                    Plan - 08/11/21 1525     Clinical Impression Statement Patient is a 31 y.o. female who presents to physical therapy with referral for Patellofemoral pain syndrome of both knees. She presents with pain limited deficits in bilateral knee strength,  endurance, gait,  balance, and functional mobility with ADL. She is having to modify and restrict ADL as indicated by FOTO score as well as subjective information and objective measures which is affecting overall participation. Patient will benefit from skilled physical therapy in order to improve function and reduce impairment.    Personal Factors and Comorbidities Comorbidity 1;Profession;Time since onset of injury/illness/exacerbation    Comorbidities increased BMI    Examination-Activity Limitations Locomotion Level;Transfers;Stand;Stairs;Squat;Lift    Examination-Participation Restrictions Cleaning;Occupation;Meal Prep;Community Activity;Shop;Volunteer;Dorita Sciara    Stability/Clinical Decision Making Stable/Uncomplicated    Clinical Decision Making Low    Rehab Potential Good    PT Frequency Other (comment)   1-2x/week for 6 weeks   PT Duration 6 weeks    PT Treatment/Interventions ADLs/Self Care Home Management;Gait training;Stair training;Ultrasound;DME Instruction;Functional mobility training;Therapeutic activities;Therapeutic exercise;Balance training;Neuromuscular re-education;Patient/family education;Orthotic Fit/Training;Manual techniques;Compression bandaging;Scar mobilization;Passive range of motion;Dry needling;Energy conservation;Taping;Splinting;Spinal Manipulations;Joint Manipulations    PT Next Visit Plan f/u with HEP, quad, glute strength; progress to HEP and gym routine    PT Home Exercise Plan squat, hip abd/ext, LAQ    Consulted and Agree with Plan of Care Patient             Patient will benefit from skilled therapeutic intervention in order to improve the following deficits and impairments:  Difficulty walking, Decreased endurance, Pain, Decreased activity tolerance, Decreased balance, Improper body mechanics, Decreased strength, Decreased mobility  Visit Diagnosis: Pain in both knees, unspecified chronicity  Muscle weakness (generalized)  Other abnormalities of gait and  mobility  Other symptoms and signs involving the musculoskeletal system     Problem List Patient Active Problem List   Diagnosis Date Noted   Patellofemoral pain syndrome of both knees 07/13/2021   Weight gain 07/01/2021   Attention and concentration deficit 07/01/2021   Chronic pain of both knees 07/01/2021   H/O preeclampsia 08/24/2017   Hypothyroidism     3:30 PM, 08/11/21 Mearl Latin PT, DPT Physical Therapist at [facility] [place], [place], [address] Phone: [phone]   Fax:  [phone]  Name: MIKIA COMO MRN: [id number] Date of Birth: 1990/10/22

## 2021-08-11 NOTE — Patient Instructions (Signed)
Access Code: [email or web address] ?URL: [email or web address] ?Date: 08/11/2021 ?Prepared by: Greig Castilla Christal Lagerstrom ? ?Exercises ?Seated Long Arc Quad - 2 x daily - 7 x weekly - 1 sets - 10 reps - 10 second hold ?Sidelying Hip Abduction - 1 x daily - 7 x weekly - 2 sets - 10 reps ?Prone Hip Extension - 1 x daily - 7 x weekly - 2 sets - 10 reps ?Squat - 1 x daily - 7 x weekly - 2 sets - 10 reps ? ?

## 2021-08-18 ENCOUNTER — Other Ambulatory Visit: Payer: Self-pay

## 2021-08-18 ENCOUNTER — Ambulatory Visit (HOSPITAL_COMMUNITY): Payer: Federal, State, Local not specified - PPO

## 2021-08-18 DIAGNOSIS — M25562 Pain in left knee: Secondary | ICD-10-CM | POA: Diagnosis not present

## 2021-08-18 DIAGNOSIS — M222X2 Patellofemoral disorders, left knee: Secondary | ICD-10-CM | POA: Diagnosis not present

## 2021-08-18 DIAGNOSIS — M25561 Pain in right knee: Secondary | ICD-10-CM | POA: Diagnosis not present

## 2021-08-18 DIAGNOSIS — M6281 Muscle weakness (generalized): Secondary | ICD-10-CM | POA: Diagnosis not present

## 2021-08-18 DIAGNOSIS — R2689 Other abnormalities of gait and mobility: Secondary | ICD-10-CM

## 2021-08-18 DIAGNOSIS — R29898 Other symptoms and signs involving the musculoskeletal system: Secondary | ICD-10-CM | POA: Diagnosis not present

## 2021-08-18 DIAGNOSIS — M222X1 Patellofemoral disorders, right knee: Secondary | ICD-10-CM | POA: Diagnosis not present

## 2021-08-18 NOTE — Therapy (Signed)
[facility] ?[address] ?[place], [place], [address] ?Phone: [phone]   Fax:  [phone] ? ?Physical Therapy Treatment ? ?Patient Details  ?Name: Gloria Lyons ?MRN: [id number] ?Date of Birth: Oct 17, 1990 ?Referring Provider (PT): Arlina Robes Guam MD ? ? ?Encounter Date: 08/18/2021 ? ? PT End of Session - 08/18/21 1601   ? ? Visit Number 2   ? Number of Visits 12   ? Date for PT Re-Evaluation 09/22/21   ? Authorization Type BCBS (no auth, 50 VL)   ? Authorization - Visit Number 2   ? Authorization - Number of Visits 50   ? PT Start Time 1600   ? PT Stop Time [id number]   ? PT Time Calculation (min) 45 min   ? Activity Tolerance Patient tolerated treatment well   ? Behavior During Therapy [facility] for tasks assessed/performed   ? ?  ?  ? ?  ? ? ?Past Medical History:  ?Diagnosis Date  ? Hypothyroidism   ? stated several years ago and does not take meds for it  ? ? ?Past Surgical History:  ?Procedure Laterality Date  ? NO PAST SURGERIES    ? TUBAL LIGATION N/A 02/08/2019  ? Procedure: POST PARTUM TUBAL LIGATION;  Surgeon: Guss Bunde, MD;  Location: [facility] LD [facility]S;  Service: Gynecology;  Laterality: N/A;  ? ? ?There were no vitals filed for this visit. ? ? Subjective Assessment - 08/18/21 1602   ? ? Subjective Patient reports no questions about HEP. She is in agreement with all rehab goals set at eval.   ? Limitations Walking;Lifting;House hold activities   ? Patient Stated Goals decrease symptoms   ? Currently in Pain? Yes   ? Pain Score 3    ? Pain Location Knee   ? Pain Orientation Right   ? Pain Descriptors / Indicators Aching   ? Pain Type Chronic pain   ? Pain Onset More than a month ago   ? Pain Frequency Intermittent   ? Multiple Pain Sites No   ? ?  ?  ? ?  ? ? ? ? ? ? ? ? ? ? ? ? ? ? ? ? ? ? ? ? [facility] Adult PT Treatment/Exercise - 08/18/21 0001   ? ?  ? Knee/Hip Exercises: Standing  ? Heel Raises 2 sets;10 reps   ? Forward Step Up 10 reps;Right;Left;Hand Hold: 2   ? Forward  Step Up Limitations with hip hike   ? Step Down Step Height: 4";10 reps;2 sets   ? Functional Squat 10 reps   ? SLS with Vectors 10   ?  ? Knee/Hip Exercises: Seated  ? Long [employer] 2 sets;10 reps   ? Long [employer] Limitations 10 second holds   ?  ? Knee/Hip Exercises: Supine  ? Bridges 10 reps;2 sets   ? Straight Leg Raises 10 reps;2 sets;Strengthening;Right;Left   ?  ? Knee/Hip Exercises: Sidelying  ? Hip ABduction 2 sets;10 reps;Both   ?  ? Knee/Hip Exercises: Prone  ? Hip Extension Both;10 reps;2 sets   ? ?  ?  ? ?  ? ? ? ? ? ? ? ? ? ? PT Education - 08/18/21 1643   ? ? Education Details Review of HEP, added to HEP and issued handout of new exercises   ? Person(s) Educated Patient   ? Methods Explanation;Demonstration;Handout;Verbal cues   ? Comprehension Verbalized understanding;Need further instruction   ? ?  ?  ? ?  ? ? ?  PT Short Term Goals - 08/11/21 1528   ? ?  ? PT SHORT TERM GOAL #1  ? Title Patient will be independent with HEP in order to improve functional outcomes.   ? Time 3   ? Period Weeks   ? Status New   ? Target Date 09/01/21   ? ?  ?  ? ?  ? ? ? ? PT Long Term Goals - 08/11/21 1528   ? ?  ? PT LONG TERM GOAL #1  ? Title Patient will report at least 75% improvement in symptoms for improved quality of life.   ? Time 6   ? Period Weeks   ? Status New   ? Target Date 09/22/21   ?  ? PT LONG TERM GOAL #2  ? Title Patient will improve FOTO score by at least 15 points in order to indicate improved tolerance to activity.   ? Time 6   ? Period Weeks   ? Status New   ? Target Date 09/22/21   ?  ? PT LONG TERM GOAL #3  ? Title Patient will be able to perform forward step down test without deviation in order to demonstrate improved LE strength and motor control.   ? Time 6   ? Period Weeks   ? Status New   ? Target Date 09/22/21   ? ?  ?  ? ?  ? ? ? ? ? ? ? ? Plan - 08/18/21 1611   ? ? Clinical Impression Statement during today's session with review of HEP and review of rehab goals set at eval visit.   Patient able to demonstrate back correctly her initial HEP.  therapist added bilat LE strengthening exercises to improve knee stablity with functional activity.Patient will continue to benefit from skilled therapy services to reduce deficits and improve functional level.   ? Personal Factors and Comorbidities Comorbidity 1;Profession;Time since onset of injury/illness/exacerbation   ? Comorbidities increased BMI   ? Examination-Activity Limitations Locomotion Level;Transfers;Stand;Stairs;Squat;Lift   ? Examination-Participation Restrictions Cleaning;Occupation;Meal Prep;Community Activity;Shop;Volunteer;Yard Work;Laundry   ? Stability/Clinical Decision Making Stable/Uncomplicated   ? Rehab Potential Good   ? PT Frequency Other (comment)   1-2x/week for 6 weeks  ? PT Duration 6 weeks   ? PT Treatment/Interventions ADLs/Self Care Home Management;Gait training;Stair training;Ultrasound;DME Instruction;Functional mobility training;Therapeutic activities;Therapeutic exercise;Balance training;Neuromuscular re-education;Patient/family education;Orthotic Fit/Training;Manual techniques;Compression bandaging;Scar mobilization;Passive range of motion;Dry needling;Energy conservation;Taping;Splinting;Spinal Manipulations;Joint Manipulations   ? PT Next Visit Plan quad, glute strength; progress to HEP and gym routine   ? PT Home Exercise Plan squat, hip abd/ext, LAQ 08/18/21 bridge, SLR   ? Consulted and Agree with Plan of Care Patient   ? ?  ?  ? ?  ? ? ?Patient will benefit from skilled therapeutic intervention in order to improve the following deficits and impairments:  Difficulty walking, Decreased endurance, Pain, Decreased activity tolerance, Decreased balance, Improper body mechanics, Decreased strength, Decreased mobility ? ?Visit Diagnosis: ?Pain in both knees, unspecified chronicity ? ?Muscle weakness (generalized) ? ?Other abnormalities of gait and mobility ? ?Other symptoms and signs involving the musculoskeletal  system ? ? ? ? ?Problem List ?Patient Active Problem List  ? Diagnosis Date Noted  ? Patellofemoral pain syndrome of both knees 07/13/2021  ? Weight gain 07/01/2021  ? Attention and concentration deficit 07/01/2021  ? Chronic pain of both knees 07/01/2021  ? H/O preeclampsia 08/24/2017  ? Hypothyroidism   ? ? ?4:46 PM, 08/18/21 ?[facility]ith[facility] Sherea Liptak ?[facility] ?[place] [phone] ?[phone] ? ? ?  [facility] ?[address] ?[place], [place], [address] ?Phone: [phone]   Fax:  [phone] ? ?Name: Gloria Lyons ?MRN: [id number] ?Date of Birth: Mar 04, 1991 ? ? ? ?

## 2021-08-18 NOTE — Patient Instructions (Signed)
Access Code: [email or web address] ?URL: [email or web address] ?Date: 08/18/2021 ?Prepared by: [facility] - Rehab ? ?Exercises ?Supine Bridge - 1 x daily - 7 x weekly - 1 sets - 10 reps ?Active Straight Leg Raise with Quad Set - 1 x daily - 7 x weekly - 1 sets - 10 reps ? ?

## 2021-08-23 ENCOUNTER — Encounter (HOSPITAL_COMMUNITY): Payer: Federal, State, Local not specified - PPO

## 2021-08-31 ENCOUNTER — Other Ambulatory Visit: Payer: Self-pay

## 2021-08-31 ENCOUNTER — Encounter (HOSPITAL_BASED_OUTPATIENT_CLINIC_OR_DEPARTMENT_OTHER): Payer: Self-pay | Admitting: Nurse Practitioner

## 2021-08-31 ENCOUNTER — Ambulatory Visit (INDEPENDENT_AMBULATORY_CARE_PROVIDER_SITE_OTHER): Payer: Federal, State, Local not specified - PPO | Admitting: Nurse Practitioner

## 2021-08-31 DIAGNOSIS — R748 Abnormal levels of other serum enzymes: Secondary | ICD-10-CM | POA: Insufficient documentation

## 2021-08-31 DIAGNOSIS — R635 Abnormal weight gain: Secondary | ICD-10-CM | POA: Diagnosis not present

## 2021-08-31 DIAGNOSIS — Z6841 Body Mass Index (BMI) 40.0 and over, adult: Secondary | ICD-10-CM | POA: Diagnosis not present

## 2021-08-31 DIAGNOSIS — Z8759 Personal history of other complications of pregnancy, childbirth and the puerperium: Secondary | ICD-10-CM | POA: Diagnosis not present

## 2021-08-31 MED ORDER — SEMAGLUTIDE(0.25 OR 0.5MG/DOS) 2 MG/1.5ML ~~LOC~~ SOPN: 0.5000 mg | PEN_INJECTOR | SUBCUTANEOUS | 0 refills | Status: DC

## 2021-08-31 MED ORDER — SEMAGLUTIDE (1 MG/DOSE) 4 MG/3ML ~~LOC~~ SOPN: 1.0000 mg | PEN_INJECTOR | SUBCUTANEOUS | 6 refills | Status: DC

## 2021-08-31 NOTE — Progress Notes (Signed)
Virtual Visit Encounter telephone visit. ? ? ?I connected with  [name] on 08/31/21 at  3:10 PM EDT by secure audio enabled telemedicine application. I verified that I am speaking with the correct person using two identifiers. ?  ?I introduced myself as a Publishing rights manager with the practice. The limitations of evaluation and management by telemedicine discussed with the patient and the availability of in person appointments. The patient expressed verbal understanding and consent to proceed. ? ?Participating parties in this visit include: Myself and patient ? ?The patient is: Patient Location: Other:  car ?I am: Provider Location: Office/Clinic ?Subjective:   ? ?CC and HPI: Gloria Lyons is a 31 y.o. year old female presenting for follow up of weight. ?Has completed 6 doses of semaglutide for weight loss with current loss of 14 lbs.  ?Tolerating medication well with no SE or concerning sx at this time ?She is working on diet and activity to help improve her health and weight loss efforts ?Feels less hungry and less cravings since starting medication ?Would like to continue ? ?Past medical history, Surgical history, Family history not pertinant except as noted below, Social history, Allergies, and medications have been entered into the medical record, reviewed, and corrections made.  ? ?Review of Systems:  ?All review of systems negative except what is listed in the HPI ? ?Objective:   ? ?Alert and oriented x 4 ?Speaking in clear sentences with no shortness of breath. ?No distress. ? ?Impression and Recommendations:   ? ?Problem List Items Addressed This Visit   ? ? Elevated liver enzymes  ? BMI 40.0-44.9, adult (HCC)  ?  Semaglutide for weight management effective. Loss of 14lbs in 6 weeks.  ?Appropriate diet and activity changes to facilitate increased weight loss ?No side effects noted. ?Recommend continue on 0.5mg  dose for 5 additional weeks then transition to 1mg  dose.  ?Monitor for new symptoms and  report any concerns.  ?Plan to f/u in 3-4 months for evaluation and recommendations or sooner if needed.  ?  ?  ? Relevant Medications  ? Semaglutide,0.25 or 0.5MG /DOS, 2 MG/1.5ML SOPN  ? Semaglutide, 1 MG/DOSE, 4 MG/3ML SOPN  ? Weight gain - Primary  ? Relevant Medications  ? Semaglutide,0.25 or 0.5MG /DOS, 2 MG/1.5ML SOPN  ? Semaglutide, 1 MG/DOSE, 4 MG/3ML SOPN  ? ?Other Visit Diagnoses   ? ? History of pre-eclampsia      ? Relevant Medications  ? Semaglutide,0.25 or 0.5MG /DOS, 2 MG/1.5ML SOPN  ? Semaglutide, 1 MG/DOSE, 4 MG/3ML SOPN  ? ?  ? ? ?orders and follow up as documented in EMR ?I discussed the assessment and treatment plan with the patient. The patient was provided an opportunity to ask questions and all were answered. The patient agreed with the plan and demonstrated an understanding of the instructions. ?  ?The patient was advised to call back or seek an in-person evaluation if the symptoms worsen or if the condition fails to improve as anticipated. ? ?Follow-Up: in 3 months ? ?I provided 15 minutes of non-face-to-face interaction with this non face-to-face encounter including intake, same-day documentation, and chart review.  ? ? , NP , DNP, AGNP-c ?[facility] ?Primary Care & Sports Medicine at [facility] ?[phone] ?[phone] (fax) ? ?

## 2021-08-31 NOTE — Assessment & Plan Note (Signed)
Semaglutide for weight management effective. Loss of 14lbs in 6 weeks.  ?Appropriate diet and activity changes to facilitate increased weight loss ?No side effects noted. ?Recommend continue on 0.5mg  dose for 5 additional weeks then transition to 1mg  dose.  ?Monitor for new symptoms and report any concerns.  ?Plan to f/u in 3-4 months for evaluation and recommendations or sooner if needed.  ?

## 2021-09-01 ENCOUNTER — Encounter (HOSPITAL_COMMUNITY): Payer: Federal, State, Local not specified - PPO | Admitting: Physical Therapy

## 2021-09-04 ENCOUNTER — Encounter (HOSPITAL_BASED_OUTPATIENT_CLINIC_OR_DEPARTMENT_OTHER): Payer: Self-pay | Admitting: Nurse Practitioner

## 2021-09-08 ENCOUNTER — Encounter (HOSPITAL_COMMUNITY): Payer: Federal, State, Local not specified - PPO | Admitting: Physical Therapy

## 2021-09-15 ENCOUNTER — Encounter (HOSPITAL_COMMUNITY): Payer: Federal, State, Local not specified - PPO | Admitting: Physical Therapy

## 2021-09-22 ENCOUNTER — Encounter (HOSPITAL_COMMUNITY): Payer: Federal, State, Local not specified - PPO | Admitting: Physical Therapy

## 2021-09-23 ENCOUNTER — Encounter (HOSPITAL_BASED_OUTPATIENT_CLINIC_OR_DEPARTMENT_OTHER): Payer: Self-pay | Admitting: Nurse Practitioner

## 2021-09-27 ENCOUNTER — Encounter (HOSPITAL_BASED_OUTPATIENT_CLINIC_OR_DEPARTMENT_OTHER): Payer: Self-pay | Admitting: Nurse Practitioner

## 2021-09-27 ENCOUNTER — Ambulatory Visit (INDEPENDENT_AMBULATORY_CARE_PROVIDER_SITE_OTHER): Payer: Federal, State, Local not specified - PPO | Admitting: Nurse Practitioner

## 2021-09-27 VITALS — Ht 65.0 in | Wt 237.0 lb

## 2021-09-27 DIAGNOSIS — K219 Gastro-esophageal reflux disease without esophagitis: Secondary | ICD-10-CM

## 2021-09-27 DIAGNOSIS — R197 Diarrhea, unspecified: Secondary | ICD-10-CM | POA: Diagnosis not present

## 2021-09-27 DIAGNOSIS — Z6841 Body Mass Index (BMI) 40.0 and over, adult: Secondary | ICD-10-CM | POA: Diagnosis not present

## 2021-09-27 MED ORDER — LOPERAMIDE HCL 2 MG PO TABS: ORAL_TABLET | ORAL | 6 refills | Status: DC

## 2021-09-27 MED ORDER — FAMOTIDINE 20 MG PO TABS: 20.0000 mg | ORAL_TABLET | Freq: Two times a day (BID) | ORAL | 6 refills | Status: DC

## 2021-09-27 NOTE — Assessment & Plan Note (Signed)
She is doing fantastic with a 22lb weight loss overall. She has made significant changes to her eating habits and her exercise and is making fantastic choices to help improve her overall health.  ?Side effects of increased indigestion and belching along with intermittent diarrhea are present. At this time, she does not want to stop the medication due to side effects.  ?Recommend famotidine PRN twice a day for reflux symptoms and imodium for diarrhea as needed.  ?Recommend go slow with the imodium to prevent increased loose stools.  ?No alarm symptoms present at this time. ?Plan to increase dosing to 1mg  with next dose. She will let me know if she does not tolerate the dose and if so, we will plan to drop back down to the 0.5mg  for additional time.  ?We will plan to f/u in 6 months or sooner if needed.  ?

## 2021-09-27 NOTE — Patient Instructions (Addendum)
Famotidine 20mg  twice a day as needed for belching ? ?Imodium 2mg - one to two at the first sign of diarrhea, then one additional with each loose stool. Go slow to prevent constipation.  ?

## 2021-09-27 NOTE — Progress Notes (Signed)
Virtual Visit Encounter telephone visit. ? ? ?I connected with  Gloria Lyons on 09/27/21 at  1:50 PM EDT by secure audio telemedicine application. I verified that I am speaking with the correct person using two identifiers. ?  ?I introduced myself as a Publishing rights manager with the practice. The limitations of evaluation and management by telemedicine discussed with the patient and the availability of in person appointments. The patient expressed verbal understanding and consent to proceed. ? ?Participating parties in this visit include: Myself and patient ? ?The patient is: Patient Location: Home ?I am: Provider Location: Office/Clinic ?Subjective:   ? ?CC and HPI: Gloria Lyons is a 31 y.o. year old female presenting for follow up of weight management. ?She has lost 22 Lyons so far. She tells me "this is changing my entire life".  ?She reports her relationship with foods has completely changed and she is able to eat healthy without snacking or breakthrough hunger. She is exercising and has been more active with her children. ?She does endorses increased belching with a "sulfa" taste to them on occasion and loose stools about midway between injections. She would like to know if anything can be done to help mitigate this.  ? ?Past medical history, Surgical history, Family history not pertinant except as noted below, Social history, Allergies, and medications have been entered into the medical record, reviewed, and corrections made.  ? ?Review of Systems:  ?All review of systems negative except what is listed in the HPI ? ?Objective:   ? ?Alert and oriented x 4 ?Speaking in clear sentences with no shortness of breath. ?No distress. ? ?Impression and Recommendations:   ? ?Problem List Items Addressed This Visit   ? ? BMI 40.0-44.9, adult [facility]) - Primary  ?  She is doing fantastic with a 22lb weight loss overall. She has made significant changes to her eating habits and her exercise and is making fantastic choices  to help improve her overall health.  ?Side effects of increased indigestion and belching along with intermittent diarrhea are present. At this time, she does not want to stop the medication due to side effects.  ?Recommend famotidine PRN twice a day for reflux symptoms and imodium for diarrhea as needed.  ?Recommend go slow with the imodium to prevent increased loose stools.  ?No alarm symptoms present at this time. ?Plan to increase dosing to 1mg  with next dose. She will let me know if she does not tolerate the dose and if so, we will plan to drop back down to the 0.5mg  for additional time.  ?We will plan to f/u in 6 months or sooner if needed.  ? ?  ?  ? ?Other Visit Diagnoses   ? ? Gastroesophageal reflux disease, unspecified whether esophagitis present      ? Relevant Medications  ? famotidine (PEPCID) 20 MG tablet  ? loperamide (IMODIUM A-D) 2 MG tablet  ? Diarrhea, unspecified type      ? Relevant Medications  ? loperamide (IMODIUM A-D) 2 MG tablet  ? ?  ? ? ?orders and follow up as documented in EMR ?I discussed the assessment and treatment plan with the patient. The patient was provided an opportunity to ask questions and all were answered. The patient agreed with the plan and demonstrated an understanding of the instructions. ?  ?The patient was advised to call back or seek an in-person evaluation if the symptoms worsen or if the condition fails to improve as anticipated. ? ?Follow-Up: in 6 months ? ?  I provided 20 minutes of non-face-to-face interaction with this non face-to-face encounter including intake, same-day documentation, and chart review.  ? ?Tollie Eth, NP , DNP, AGNP-c ?[facility] ?Primary Care & Sports Medicine at [facility] ?[phone] ?[phone] (fax) ? ?

## 2021-11-11 ENCOUNTER — Encounter (HOSPITAL_BASED_OUTPATIENT_CLINIC_OR_DEPARTMENT_OTHER): Payer: Self-pay | Admitting: Nurse Practitioner

## 2021-11-16 ENCOUNTER — Other Ambulatory Visit (HOSPITAL_BASED_OUTPATIENT_CLINIC_OR_DEPARTMENT_OTHER): Payer: Self-pay

## 2021-11-16 DIAGNOSIS — R635 Abnormal weight gain: Secondary | ICD-10-CM

## 2021-11-16 MED ORDER — SEMAGLUTIDE-WEIGHT MANAGEMENT 1.7 MG/0.75ML ~~LOC~~ SOAJ: 1.7000 mg | Freq: Once | SUBCUTANEOUS | 0 refills | Status: AC

## 2021-11-26 ENCOUNTER — Telehealth (HOSPITAL_BASED_OUTPATIENT_CLINIC_OR_DEPARTMENT_OTHER): Payer: Self-pay

## 2021-11-26 NOTE — Telephone Encounter (Signed)
Key [id number] approved

## 2021-11-26 NOTE — Telephone Encounter (Signed)
Received fax from [facility] that Wegovy 1.7 mg/0.66ml [place] SOAJ  PA was approved Authorization valid from 10/27/21 through 11/26/22. Please advise.

## 2021-11-29 ENCOUNTER — Ambulatory Visit (HOSPITAL_BASED_OUTPATIENT_CLINIC_OR_DEPARTMENT_OTHER): Payer: Federal, State, Local not specified - PPO | Admitting: Nurse Practitioner

## 2021-12-03 ENCOUNTER — Encounter (HOSPITAL_BASED_OUTPATIENT_CLINIC_OR_DEPARTMENT_OTHER): Payer: Self-pay | Admitting: Nurse Practitioner

## 2021-12-03 ENCOUNTER — Ambulatory Visit (HOSPITAL_BASED_OUTPATIENT_CLINIC_OR_DEPARTMENT_OTHER): Payer: Federal, State, Local not specified - PPO | Admitting: Nurse Practitioner

## 2021-12-03 VITALS — BP 128/88 | HR 88 | Ht 65.0 in | Wt 218.0 lb

## 2021-12-03 DIAGNOSIS — R635 Abnormal weight gain: Secondary | ICD-10-CM

## 2021-12-03 DIAGNOSIS — Z6841 Body Mass Index (BMI) 40.0 and over, adult: Secondary | ICD-10-CM | POA: Diagnosis not present

## 2021-12-03 DIAGNOSIS — R112 Nausea with vomiting, unspecified: Secondary | ICD-10-CM

## 2021-12-03 DIAGNOSIS — E038 Other specified hypothyroidism: Secondary | ICD-10-CM | POA: Diagnosis not present

## 2021-12-03 DIAGNOSIS — I951 Orthostatic hypotension: Secondary | ICD-10-CM

## 2021-12-03 DIAGNOSIS — R5382 Chronic fatigue, unspecified: Secondary | ICD-10-CM | POA: Diagnosis not present

## 2021-12-03 MED ORDER — PROMETHAZINE HCL 25 MG PO TABS: 12.5000 mg | ORAL_TABLET | Freq: Four times a day (QID) | ORAL | 3 refills | Status: DC | PRN

## 2021-12-03 MED ORDER — ONDANSETRON HCL 8 MG PO TABS: 8.0000 mg | ORAL_TABLET | Freq: Three times a day (TID) | ORAL | 2 refills | Status: DC | PRN

## 2021-12-03 NOTE — Progress Notes (Signed)
Gloria Clamp, DNP, AGNP-c [facility] & [facility] [address] [place], [place] [address] [phone] Office [phone] Fax  ESTABLISHED PATIENT- Chronic Health and/or Follow-Up Visit  Blood pressure 128/88, pulse 88, height 5\' 5"  (1.651 m), weight 218 lb (98.9 kg), SpO2 98 %.  Follow-up (Patient presents for follow up on Ozempic. She is doing great, just now has nausea. Needs something for the nausea.)   HPI  Gloria Lyons  is a 31 y.o. year old female presenting today for evaluation and management of the following: Ozempic Gloria Lyons reports she is doing very well with the medication.  She is tolerating it well with the exception of mild nausea.  She would like to know if she can have something today to help with the nausea.  She would like to continue with the medication. Dizziness She endorses dizziness with this with movements of her head or moving from the seated to standing position.  She tells me that this is fairly consistent with each time she moves.  She has not passed out but has felt like she might a few times.  She tells me that her water intake is likely not what it should be. Fatigue Gloria Lyons endorses chronic fatigue.  She tells me that she always feels tired.  She did start taking vitamin D however it was making her nauseous and therefore she stopped taking the medication.  She does have a history of abnormal thyroid levels.    ROS All ROS negative with exception of what is listed in HPI  PHYSICAL EXAM Physical Exam Vitals and nursing note reviewed.  Constitutional:      General: She is not in acute distress.    Appearance: Normal appearance.  HENT:     Head: Normocephalic.  Eyes:     Extraocular Movements: Extraocular movements intact.     Conjunctiva/sclera: Conjunctivae normal.     Pupils: Pupils are equal, round, and reactive to light.  Neck:     Vascular: No carotid bruit.  Cardiovascular:     Rate and Rhythm:  Normal rate and regular rhythm.     Pulses: Normal pulses.     Heart sounds: Normal heart sounds. No murmur heard. Pulmonary:     Effort: Pulmonary effort is normal.     Breath sounds: Normal breath sounds. No wheezing.  Abdominal:     General: Bowel sounds are normal. There is no distension.     Palpations: Abdomen is soft.     Tenderness: There is no abdominal tenderness. There is no guarding.  Musculoskeletal:        General: Normal range of motion.     Cervical back: Normal range of motion and neck supple.     Right lower leg: No edema.     Left lower leg: No edema.  Lymphadenopathy:     Cervical: No cervical adenopathy.  Skin:    General: Skin is warm and dry.     Capillary Refill: Capillary refill takes less than 2 seconds.  Neurological:     General: No focal deficit present.     Mental Status: She is alert and oriented to person, place, and time.  Psychiatric:        Mood and Affect: Mood normal.        Behavior: Behavior normal.        Thought Content: Thought content normal.        Judgment: Judgment normal.     ASSESSMENT & PLAN Problem List Items  Addressed This Visit     Weight gain - Primary    Continue with Ozempic at this time.  Plan to increase dose.  If intolerance to increase dose presents patient will contact the office immediately.      Relevant Orders   CBC with Differential/Platelet (Completed)   Comprehensive metabolic panel (Completed)   Lipid panel (Completed)   Hemoglobin A1c (Completed)   Orthostatic hypotension    Patient endorsed symptoms of dizziness with this with movement of the head or moving from a seated to a standing position.  Symptoms are consistent with orthostatic hypotension.  Measurements of blood pressures today are as follows :118/80- sit, 110/74- stand, 112/78- lay. Patient encouraged to maintain proper hydration by consuming at least 64 ounces of fluids a day and avoiding fluids that are high in caffeine or alcohol as these  can cause dehydration.  Recommend slow changes in posture to help reduce severity of symptoms when moving from lying to seated or seated to standing position.  When symptoms do arise lay down immediately and prop the feet above the head if possible. Increased sodium intake may help maintain fluid within the blood vessels.  Also consider compression stockings to help improve circulation to the lower extremities.  Suspect there is a level of dehydration present. We will obtain labs today for monitoring.  If symptoms continue or do not improve with the recommended treatments will send referral for cardiology for further evaluation.      Relevant Orders   CBC with Differential/Platelet (Completed)   Comprehensive metabolic panel (Completed)   Lipid panel (Completed)   Hemoglobin A1c (Completed)   T4, free (Completed)   TSH (Completed)   B12 and Folate Panel (Completed)   Nausea and vomiting    We will send Zofran for treatment of nausea.  Patient may also try over-the-counter remedies such as ginger tea or meclizine.  If symptoms persist or worsening she will contact the office for further evaluation.  Strongly recommend discontinuation of Ozempic if nausea or vomiting occurs that cannot be controlled.      Relevant Medications   ondansetron (ZOFRAN) 8 MG tablet   promethazine (PHENERGAN) 25 MG tablet   Chronic fatigue    We will plan to monitor thyroid levels today and determine if intervention or treatment is necessary.  Recommendations for regular exercise, adequate sleep, balance diet provided to help support energy levels.  No alarm symptoms present at this time.  We will make changes to plan of care based on lab findings.      Relevant Orders   B12 and Folate Panel (Completed)   Hypothyroidism    Historical on labs.  She is not on any medications at this time.  We will repeat labs today for further evaluation and make changes to plan of care based on findings.      Relevant Orders    T4, free (Completed)   TSH (Completed)   BMI 40.0-44.9, adult (HCC)    She is doing fantastic with her diet and exercise changes.  She is experiencing some nausea with the increased dose of Ozempic.  We will send medication for nausea and today.  Encourage patient to continue on current regimen and monitor for new or worsening symptoms.  We will plan to follow-up in approximately 3 months or sooner if needed.      Relevant Medications   OZEMPIC, 2 MG/DOSE, 8 MG/3ML SOPN   Other Relevant Orders   CBC with Differential/Platelet (Completed)   Comprehensive metabolic  panel (Completed)   Lipid panel (Completed)   Hemoglobin A1c (Completed)     FOLLOW-UP Return in about 6 months (around 06/04/2022) for weight management, thyroid.  Gloria Clamp, DNP, AGNP-c 12/03/2021  8:42 AM

## 2021-12-04 LAB — COMPREHENSIVE METABOLIC PANEL
ALT: 31 IU/L (ref 0–32)
AST: 20 IU/L (ref 0–40)
Albumin/Globulin Ratio: 1.8 (ref 1.2–2.2)
Albumin: 4.4 g/dL (ref 3.8–4.8)
Alkaline Phosphatase: 53 IU/L (ref 44–121)
BUN/Creatinine Ratio: 12 (ref 9–23)
BUN: 10 mg/dL (ref 6–20)
Bilirubin Total: 0.4 mg/dL (ref 0.0–1.2)
CO2: 23 mmol/L (ref 20–29)
Calcium: 9.4 mg/dL (ref 8.7–10.2)
Chloride: 103 mmol/L (ref 96–106)
Creatinine, Ser: 0.84 mg/dL (ref 0.57–1.00)
Globulin, Total: 2.4 g/dL (ref 1.5–4.5)
Glucose: 90 mg/dL (ref 70–99)
Potassium: 4.2 mmol/L (ref 3.5–5.2)
Sodium: 140 mmol/L (ref 134–144)
Total Protein: 6.8 g/dL (ref 6.0–8.5)
eGFR: 95 mL/min/{1.73_m2} (ref >59)

## 2021-12-04 LAB — LIPID PANEL
Chol/HDL Ratio: 3 ratio (ref 0.0–4.4)
Cholesterol, Total: 109 mg/dL (ref 100–199)
HDL: 36 mg/dL — ABNORMAL LOW (ref >39)
LDL Chol Calc (NIH): 55 mg/dL (ref 0–99)
Triglycerides: 92 mg/dL (ref 0–149)
VLDL Cholesterol Cal: 18 mg/dL (ref 5–40)

## 2021-12-04 LAB — HEMOGLOBIN A1C
Est. average glucose Bld gHb Est-mCnc: 103 mg/dL
Hgb A1c MFr Bld: 5.2 % (ref 4.8–5.6)

## 2021-12-04 LAB — CBC WITH DIFFERENTIAL/PLATELET
Basophils Absolute: 0 10*3/uL (ref 0.0–0.2)
Basos: 1 %
EOS (ABSOLUTE): 0.2 10*3/uL (ref 0.0–0.4)
Eos: 3 %
Hematocrit: 40.7 % (ref 34.0–46.6)
Hemoglobin: 13.9 g/dL (ref 11.1–15.9)
Immature Grans (Abs): 0 10*3/uL (ref 0.0–0.1)
Immature Granulocytes: 0 %
Lymphocytes Absolute: 1.3 10*3/uL (ref 0.7–3.1)
Lymphs: 23 %
MCH: 28.9 pg (ref 26.6–33.0)
MCHC: 34.2 g/dL (ref 31.5–35.7)
MCV: 85 fL (ref 79–97)
Monocytes Absolute: 0.5 10*3/uL (ref 0.1–0.9)
Monocytes: 9 %
Neutrophils Absolute: 3.6 10*3/uL (ref 1.4–7.0)
Neutrophils: 64 %
Platelets: 254 10*3/uL (ref 150–450)
RBC: 4.81 x10E6/uL (ref 3.77–5.28)
RDW: 12.9 % (ref 11.7–15.4)
WBC: 5.6 10*3/uL (ref 3.4–10.8)

## 2021-12-04 LAB — B12 AND FOLATE PANEL
Folate: 3.4 ng/mL (ref >3.0)
Vitamin B-12: 375 pg/mL (ref 232–1245)

## 2021-12-04 LAB — T4, FREE: Free T4: 1.48 ng/dL (ref 0.82–1.77)

## 2021-12-04 LAB — TSH: TSH: 1.66 u[IU]/mL (ref 0.450–4.500)

## 2021-12-15 NOTE — Assessment & Plan Note (Signed)
Historical on labs.  She is not on any medications at this time.  We will repeat labs today for further evaluation and make changes to plan of care based on findings.

## 2021-12-15 NOTE — Assessment & Plan Note (Signed)
She is doing fantastic with her diet and exercise changes.  She is experiencing some nausea with the increased dose of Ozempic.  We will send medication for nausea and today.  Encourage patient to continue on current regimen and monitor for new or worsening symptoms.  We will plan to follow-up in approximately 3 months or sooner if needed.

## 2021-12-15 NOTE — Assessment & Plan Note (Addendum)
Patient endorsed symptoms of dizziness with this with movement of the head or moving from a seated to a standing position.  Symptoms are consistent with orthostatic hypotension.  Measurements of blood pressures today are as follows :118/80- sit, 110/74- stand, 112/78- lay. Patient encouraged to maintain proper hydration by consuming at least 64 ounces of fluids a day and avoiding fluids that are high in caffeine or alcohol as these can cause dehydration.  Recommend slow changes in posture to help reduce severity of symptoms when moving from lying to seated or seated to standing position.  When symptoms do arise lay down immediately and prop the feet above the head if possible. Increased sodium intake may help maintain fluid within the blood vessels.  Also consider compression stockings to help improve circulation to the lower extremities.  Suspect there is a level of dehydration present. We will obtain labs today for monitoring.  If symptoms continue or do not improve with the recommended treatments will send referral for cardiology for further evaluation.

## 2021-12-22 DIAGNOSIS — R5382 Chronic fatigue, unspecified: Secondary | ICD-10-CM | POA: Insufficient documentation

## 2021-12-22 DIAGNOSIS — R112 Nausea with vomiting, unspecified: Secondary | ICD-10-CM | POA: Insufficient documentation

## 2021-12-22 HISTORY — DX: Nausea with vomiting, unspecified: R11.2

## 2021-12-22 NOTE — Assessment & Plan Note (Signed)
We will plan to monitor thyroid levels today and determine if intervention or treatment is necessary.  Recommendations for regular exercise, adequate sleep, balance diet provided to help support energy levels.  No alarm symptoms present at this time.  We will make changes to plan of care based on lab findings.

## 2021-12-22 NOTE — Assessment & Plan Note (Signed)
We will send Zofran for treatment of nausea.  Patient may also try over-the-counter remedies such as ginger tea or meclizine.  If symptoms persist or worsening she will contact the office for further evaluation.  Strongly recommend discontinuation of Ozempic if nausea or vomiting occurs that cannot be controlled.

## 2021-12-22 NOTE — Assessment & Plan Note (Signed)
Continue with Ozempic at this time.  Plan to increase dose.  If intolerance to increase dose presents patient will contact the office immediately.

## 2021-12-29 ENCOUNTER — Encounter (HOSPITAL_BASED_OUTPATIENT_CLINIC_OR_DEPARTMENT_OTHER): Payer: Self-pay | Admitting: Nurse Practitioner

## 2022-01-05 ENCOUNTER — Other Ambulatory Visit (HOSPITAL_BASED_OUTPATIENT_CLINIC_OR_DEPARTMENT_OTHER): Payer: Self-pay | Admitting: Nurse Practitioner

## 2022-01-05 ENCOUNTER — Other Ambulatory Visit (HOSPITAL_BASED_OUTPATIENT_CLINIC_OR_DEPARTMENT_OTHER): Payer: Self-pay

## 2022-01-05 DIAGNOSIS — R635 Abnormal weight gain: Secondary | ICD-10-CM

## 2022-01-05 DIAGNOSIS — R748 Abnormal levels of other serum enzymes: Secondary | ICD-10-CM

## 2022-01-05 DIAGNOSIS — Z6841 Body Mass Index (BMI) 40.0 and over, adult: Secondary | ICD-10-CM

## 2022-01-05 MED ORDER — OZEMPIC (2 MG/DOSE) 8 MG/3ML ~~LOC~~ SOPN
2.0000 mg | PEN_INJECTOR | SUBCUTANEOUS | 5 refills | Status: DC
  Filled 2022-01-05: qty 3, 28d supply, fill #0
  Filled 2022-01-29: qty 3, 28d supply, fill #1
  Filled 2022-02-26: qty 9, 84d supply, fill #2
  Filled 2022-05-27: qty 3, 28d supply, fill #3

## 2022-01-31 ENCOUNTER — Other Ambulatory Visit (HOSPITAL_BASED_OUTPATIENT_CLINIC_OR_DEPARTMENT_OTHER): Payer: Self-pay

## 2022-02-28 ENCOUNTER — Other Ambulatory Visit (HOSPITAL_BASED_OUTPATIENT_CLINIC_OR_DEPARTMENT_OTHER): Payer: Self-pay

## 2022-04-25 ENCOUNTER — Ambulatory Visit
Admission: EM | Admit: 2022-04-25 | Discharge: 2022-04-25 | Disposition: A | Discharge status: Home / Self Care | Payer: Federal, State, Local not specified - PPO

## 2022-04-25 DIAGNOSIS — J0100 Acute maxillary sinusitis, unspecified: Secondary | ICD-10-CM

## 2022-04-25 DIAGNOSIS — J3089 Other allergic rhinitis: Secondary | ICD-10-CM | POA: Diagnosis not present

## 2022-04-25 DIAGNOSIS — H6993 Unspecified Eustachian tube disorder, bilateral: Secondary | ICD-10-CM | POA: Diagnosis not present

## 2022-04-25 MED ORDER — AMOXICILLIN-POT CLAVULANATE 875-125 MG PO TABS: 1.0000 | ORAL_TABLET | Freq: Two times a day (BID) | ORAL | 0 refills | Status: DC

## 2022-04-25 MED ORDER — METHYLPREDNISOLONE SODIUM SUCC 125 MG IJ SOLR
80.0000 mg | Freq: Once | INTRAMUSCULAR | Status: AC
  Administered 2022-04-25: 80 mg via INTRAMUSCULAR

## 2022-04-25 NOTE — ED Triage Notes (Signed)
Pt reports nasal congestion ad bilateral ear pressure on and off 3 weeks. Zyrtec, Flonase and Sudafed gives some relief.

## 2022-04-26 NOTE — ED Provider Notes (Signed)
[facility]    CSN: [id number] Arrival date & time: 04/25/22  1652      History   Chief Complaint Chief Complaint  Patient presents with   Nasal Congestion    HPI Gloria Lyons Gloria Lyons is a 31 y.o. female.   Presenting today with 3-week history of nasal congestion, bilateral ear pain and pressure and now facial pain and pressure, fatigue, low-grade fevers, malaise.  Denies chest pain, shortness of breath, abdominal pain, nausea vomiting or diarrhea.  Trying Zyrtec, Flonase and Sudafed with minimal relief.  History of seasonal allergies compliant with regimen.    Past Medical History:  Diagnosis Date   Hypothyroidism    stated several years ago and does not take meds for it    Patient Active Problem List   Diagnosis Date Noted   Nausea and vomiting 12/22/2021   Chronic fatigue 12/22/2021   Orthostatic hypotension 12/03/2021   Elevated liver enzymes 08/31/2021   BMI 40.0-44.9, adult (HCC) 08/31/2021   Patellofemoral pain syndrome of both knees 07/13/2021   Weight gain 07/01/2021   Attention and concentration deficit 07/01/2021   Chronic pain of both knees 07/01/2021   H/O preeclampsia 08/24/2017   Hypothyroidism     Past Surgical History:  Procedure Laterality Date   NO PAST SURGERIES     TUBAL LIGATION N/A 02/08/2019   Procedure: POST PARTUM TUBAL LIGATION;  Surgeon: Lesly Dukes, MD;  Location: [facility] LD ORS;  Service: Gynecology;  Laterality: N/A;    OB History     Gravida  2   Para  2   Term  2   Preterm      AB      Living  2      SAB      IAB      Ectopic      Multiple  0   Live Births  2            Home Medications    Prior to Admission medications   Medication Sig Start Date End Date Taking? Authorizing Provider  amoxicillin-clavulanate (AUGMENTIN) 875-125 MG tablet Take 1 tablet by mouth every 12 (twelve) hours. 04/25/22  Yes Particia Nearing, PA-C  cetirizine (ZYRTEC) 10 MG chewable tablet Chew 10 mg by mouth  daily.   Yes [provider]  fluticasone (FLONASE) 50 MCG/ACT nasal spray Place into both nostrils daily.   Yes [provider]  pseudoephedrine (SUDAFED) 120 MG 12 hr tablet Take 120 mg by mouth 2 (two) times daily.   Yes [provider]  famotidine (PEPCID) 20 MG tablet Take 1 tablet (20 mg total) by mouth 2 (two) times daily. 09/27/21   Tollie Eth, NP  loperamide (IMODIUM A-D) 2 MG tablet Take 1-2 (2-4mg ) tabs with the first loose stool. May take an additional 1 (2mg ) tab with each additional watery/loose bowel movement up to a maximum of 8 tabs (16mg ) in 24 hours. 09/27/21   , NP  ondansetron (ZOFRAN) 8 MG tablet Take 1 tablet (8 mg total) by mouth every 8 (eight) hours as needed for nausea or vomiting. 12/03/21   Early, Tollie Eth, NP  OZEMPIC, 2 MG/DOSE, 8 MG/3ML SOPN Inject 2 mg into the skin once a week. 01/05/22   [name], NP  promethazine (PHENERGAN) 25 MG tablet Take 0.5-1 tablets (12.5-25 mg total) by mouth every 6 (six) hours as needed for vomiting. 12/03/21   Early, 07[birth year]1984, NP    Family History Family  History  Problem Relation Age of Onset   Cancer Paternal Grandfather    Diabetes Paternal Grandfather    Cancer Maternal Grandmother    Diabetes Maternal Grandmother    Diabetes Maternal Grandfather    Leukemia Father    Hypertension Father    Alcohol abuse Father    Cancer Father    COPD Father    Depression Father    Heart disease Father    Heart attack Mother    Seizures Mother    Hypertension Mother    Anxiety disorder Mother    Depression Mother    Hypertension Paternal Aunt    Hypertension Paternal Aunt     Social History Social History   Tobacco Use   Smoking status: Passive Smoke Exposure - Never Smoker    Passive exposure: Yes   Smokeless tobacco: Never   Tobacco comments:    Both parents smoke. I never have but I had 25 years of second hand smoke.  Vaping Use   Vaping Use: Never used  Substance Use Topics    Alcohol use: Yes    Comment: Maybe once every 2-3 months   Drug use: No     Allergies   Patient has no known allergies.   Review of Systems Review of Systems Per HPI  Physical Exam Triage Vital Signs ED Triage Vitals  Enc Vitals Group     BP 04/25/22 1820 134/84     Pulse Rate 04/25/22 1820 (!) 104     Resp 04/25/22 1820 18     Temp 04/25/22 1820 99.3 F (37.4 C)     Temp Source 04/25/22 1820 Oral     SpO2 04/25/22 1820 99 %     Weight --      Height --      Head Circumference --      Peak Flow --      Pain Score 04/25/22 1819 0     Pain Loc --      Pain Edu? --      Excl. in GC? --    No data found.  Updated Vital Signs BP 134/84 (BP Location: Right Arm)   Pulse (!) 104   Temp 99.3 F (37.4 C) (Oral)   Resp 18   LMP  (Within Weeks) Comment: 2-3 weeks  SpO2 99%   Visual Acuity Right Eye Distance:   Left Eye Distance:   Bilateral Distance:    Right Eye Near:   Left Eye Near:    Bilateral Near:     Physical Exam Vitals and nursing note reviewed.  Constitutional:      Appearance: Normal appearance.  HENT:     Head: Atraumatic.     Right Ear: External ear normal.     Left Ear: External ear normal.     Ears:     Comments: Bilateral middle ear effusions    Nose: Congestion present.     Mouth/Throat:     Mouth: Mucous membranes are moist.     Pharynx: Posterior oropharyngeal erythema present.  Eyes:     Extraocular Movements: Extraocular movements intact.     Conjunctiva/sclera: Conjunctivae normal.  Cardiovascular:     Rate and Rhythm: Normal rate and regular rhythm.     Heart sounds: Normal heart sounds.  Pulmonary:     Effort: Pulmonary effort is normal.     Breath sounds: Normal breath sounds. No wheezing or rales.  Musculoskeletal:        General: Normal range of motion.  Cervical back: Normal range of motion and neck supple.  Skin:    General: Skin is warm and dry.  Neurological:     Mental Status: She is alert and oriented to  person, place, and time.  Psychiatric:        Mood and Affect: Mood normal.        Thought Content: Thought content normal.      UC Treatments / Results  Labs (all labs ordered are listed, but only abnormal results are displayed) Labs Reviewed - No data to display  EKG   Radiology No results found.  Procedures Procedures (including critical care time)  Medications Ordered in UC Medications  methylPREDNISolone sodium succinate (SOLU-MEDROL) 125 mg/2 mL injection 80 mg (80 mg Intramuscular Given 04/25/22 1849)    Initial Impression / Assessment and Plan / UC Course  I have reviewed the triage vital signs and the nursing notes.  Pertinent labs & imaging results that were available during my care of the patient were reviewed by me and considered in my medical decision making (see chart for details).     Treat with IM Solu-Medrol, Augmentin, continued allergy regimen and nasal sprays.  Discussed other supportive over-the-counter medications and home care additionally.  Return for worsening symptoms.  Final Clinical Impressions(s) / UC Diagnoses   Final diagnoses:  Acute maxillary sinusitis, recurrence not specified  Seasonal allergic rhinitis due to other allergic trigger  Eustachian tube dysfunction, bilateral   Discharge Instructions   None    ED Prescriptions     Medication Sig Dispense Auth. Provider   amoxicillin-clavulanate (AUGMENTIN) 875-125 MG tablet Take 1 tablet by mouth every 12 (twelve) hours. 14 tablet Particia Nearing, [place]      PDMP not reviewed this encounter.   Particia Nearing, [place] 04/26/22 2039

## 2022-05-27 ENCOUNTER — Other Ambulatory Visit: Payer: Self-pay | Admitting: Nurse Practitioner

## 2022-05-27 ENCOUNTER — Other Ambulatory Visit (HOSPITAL_BASED_OUTPATIENT_CLINIC_OR_DEPARTMENT_OTHER): Payer: Self-pay

## 2022-06-16 ENCOUNTER — Ambulatory Visit (HOSPITAL_BASED_OUTPATIENT_CLINIC_OR_DEPARTMENT_OTHER): Payer: Federal, State, Local not specified - PPO | Admitting: Nurse Practitioner

## 2022-06-21 ENCOUNTER — Encounter: Payer: Self-pay | Admitting: Nurse Practitioner

## 2022-06-21 ENCOUNTER — Ambulatory Visit: Payer: Federal, State, Local not specified - PPO | Admitting: Nurse Practitioner

## 2022-06-21 VITALS — BP 112/62 | HR 72 | Ht 65.0 in | Wt 173.8 lb

## 2022-06-21 DIAGNOSIS — R5382 Chronic fatigue, unspecified: Secondary | ICD-10-CM

## 2022-06-21 DIAGNOSIS — M222X1 Patellofemoral disorders, right knee: Secondary | ICD-10-CM

## 2022-06-21 DIAGNOSIS — Z6841 Body Mass Index (BMI) 40.0 and over, adult: Secondary | ICD-10-CM | POA: Diagnosis not present

## 2022-06-21 DIAGNOSIS — M222X2 Patellofemoral disorders, left knee: Secondary | ICD-10-CM

## 2022-06-21 DIAGNOSIS — R635 Abnormal weight gain: Secondary | ICD-10-CM | POA: Diagnosis not present

## 2022-06-21 DIAGNOSIS — E039 Hypothyroidism, unspecified: Secondary | ICD-10-CM | POA: Diagnosis not present

## 2022-06-21 DIAGNOSIS — E559 Vitamin D deficiency, unspecified: Secondary | ICD-10-CM

## 2022-06-21 DIAGNOSIS — I951 Orthostatic hypotension: Secondary | ICD-10-CM | POA: Diagnosis not present

## 2022-06-21 DIAGNOSIS — R748 Abnormal levels of other serum enzymes: Secondary | ICD-10-CM

## 2022-06-21 MED ORDER — SEMAGLUTIDE (2 MG/DOSE) 8 MG/3ML ~~LOC~~ SOPN: 2.0000 mg | PEN_INJECTOR | SUBCUTANEOUS | 3 refills | Status: DC

## 2022-06-21 NOTE — Progress Notes (Unsigned)
  Worthy Keeler, DNP, AGNP-c [facility]  [address] [place], [place] [address] [phone]  ESTABLISHED PATIENT- Chronic Health and/or Follow-Up Visit  Blood pressure 112/62, pulse 72, height 5\' 5"  (1.651 m), weight 173 lb 12.8 oz (78.8 kg), last menstrual period 06/09/2022.    Gloria Lyons is a 32 y.o. year old female presenting today for evaluation and management of the following: Weight Management Gloria Lyons has been working on diet and exercise and using ozempic for management of her weight for about the last year. She tells me she is tolerating the medication well and has had substantial weight loss. She tells me that she feels better than she has felt in a long time.  Her starter weight was 259 lb her weight at home this morning was 168lb. She has lost a total of 91lbs so far.  She would like to loose an additional 15-20lbs to reach her goal weight.  She continues to work on her diet and exercise.   All ROS negative with exception of what is listed above.   PHYSICAL EXAM Physical Exam Vitals and nursing note reviewed.  Constitutional:      General: She is not in acute distress.    Appearance: Normal appearance.  HENT:     Head: Normocephalic.  Eyes:     Extraocular Movements: Extraocular movements intact.     Conjunctiva/sclera: Conjunctivae normal.     Pupils: Pupils are equal, round, and reactive to light.  Neck:     Vascular: No carotid bruit.  Cardiovascular:     Rate and Rhythm: Normal rate and regular rhythm.     Pulses: Normal pulses.     Heart sounds: Normal heart sounds. No murmur heard. Pulmonary:     Effort: Pulmonary effort is normal.     Breath sounds: Normal breath sounds. No wheezing.  Abdominal:     General: Bowel sounds are normal. There is no distension.     Palpations: Abdomen is soft.     Tenderness: There is no abdominal tenderness. There is no guarding.  Musculoskeletal:        General: Normal range of motion.      Cervical back: Normal range of motion and neck supple.     Right lower leg: No edema.     Left lower leg: No edema.  Lymphadenopathy:     Cervical: No cervical adenopathy.  Skin:    General: Skin is warm and dry.     Capillary Refill: Capillary refill takes less than 2 seconds.  Neurological:     General: No focal deficit present.     Mental Status: She is alert and oriented to person, place, and time.  Psychiatric:        Mood and Affect: Mood normal.        Behavior: Behavior normal.        Thought Content: Thought content normal.        Judgment: Judgment normal.    PLAN Problem List Items Addressed This Visit   None   No follow-ups on file.   Worthy Keeler, DNP, AGNP-c 06/21/2022  3:49 PM

## 2022-06-21 NOTE — Patient Instructions (Addendum)
I AM SO PROUD OF YOU!!!!!!!!!!  Please keep up the great work! I will let you know if we need to make any changes based on the labs.   I will let you know what your thyroid looks like and we will decide on medication options that is needed.

## 2022-06-22 LAB — IRON,TIBC AND FERRITIN PANEL
Ferritin: 73 ng/mL (ref 15–150)
Iron Saturation: 24 % (ref 15–55)
Iron: 76 ug/dL (ref 27–159)
Total Iron Binding Capacity: 311 ug/dL (ref 250–450)
UIBC: 235 ug/dL (ref 131–425)

## 2022-06-22 LAB — CBC WITH DIFFERENTIAL/PLATELET
Basophils Absolute: 0.1 10*3/uL (ref 0.0–0.2)
Basos: 1 %
EOS (ABSOLUTE): 0.2 10*3/uL (ref 0.0–0.4)
Eos: 2 %
Hematocrit: 39.1 % (ref 34.0–46.6)
Hemoglobin: 13.4 g/dL (ref 11.1–15.9)
Immature Grans (Abs): 0 10*3/uL (ref 0.0–0.1)
Immature Granulocytes: 0 %
Lymphocytes Absolute: 1.3 10*3/uL (ref 0.7–3.1)
Lymphs: 20 %
MCH: 28.9 pg (ref 26.6–33.0)
MCHC: 34.3 g/dL (ref 31.5–35.7)
MCV: 84 fL (ref 79–97)
Monocytes Absolute: 0.5 10*3/uL (ref 0.1–0.9)
Monocytes: 7 %
Neutrophils Absolute: 4.8 10*3/uL (ref 1.4–7.0)
Neutrophils: 70 %
Platelets: 258 10*3/uL (ref 150–450)
RBC: 4.64 x10E6/uL (ref 3.77–5.28)
RDW: 12.7 % (ref 11.7–15.4)
WBC: 6.9 10*3/uL (ref 3.4–10.8)

## 2022-06-22 LAB — COMPREHENSIVE METABOLIC PANEL
ALT: 9 IU/L (ref 0–32)
AST: 12 IU/L (ref 0–40)
Albumin/Globulin Ratio: 1.9 (ref 1.2–2.2)
Albumin: 4.3 g/dL (ref 3.9–4.9)
Alkaline Phosphatase: 61 IU/L (ref 44–121)
BUN/Creatinine Ratio: 10 (ref 9–23)
BUN: 9 mg/dL (ref 6–20)
Bilirubin Total: 0.5 mg/dL (ref 0.0–1.2)
CO2: 23 mmol/L (ref 20–29)
Calcium: 9.7 mg/dL (ref 8.7–10.2)
Chloride: 101 mmol/L (ref 96–106)
Creatinine, Ser: 0.9 mg/dL (ref 0.57–1.00)
Globulin, Total: 2.3 g/dL (ref 1.5–4.5)
Glucose: 80 mg/dL (ref 70–99)
Potassium: 4.4 mmol/L (ref 3.5–5.2)
Sodium: 137 mmol/L (ref 134–144)
Total Protein: 6.6 g/dL (ref 6.0–8.5)
eGFR: 87 mL/min/{1.73_m2} (ref >59)

## 2022-06-22 LAB — B12 AND FOLATE PANEL
Folate: 3 ng/mL — ABNORMAL LOW (ref >3.0)
Vitamin B-12: 256 pg/mL (ref 232–1245)

## 2022-06-22 LAB — TSH: TSH: 1.24 u[IU]/mL (ref 0.450–4.500)

## 2022-06-22 LAB — T4, FREE: Free T4: 1.53 ng/dL (ref 0.82–1.77)

## 2022-06-22 LAB — VITAMIN D 25 HYDROXY (VIT D DEFICIENCY, FRACTURES): Vit D, 25-Hydroxy: 36 ng/mL (ref 30.0–100.0)

## 2022-06-22 LAB — HEMOGLOBIN A1C
Est. average glucose Bld gHb Est-mCnc: 97 mg/dL
Hgb A1c MFr Bld: 5 % (ref 4.8–5.6)

## 2022-06-24 MED ORDER — VITAMIN D (ERGOCALCIFEROL) 1.25 MG (50000 UNIT) PO CAPS: 50000.0000 [IU] | ORAL_CAPSULE | ORAL | 3 refills | Status: DC

## 2022-06-25 ENCOUNTER — Encounter: Payer: Self-pay | Admitting: Nurse Practitioner

## 2022-06-26 ENCOUNTER — Telehealth: Payer: Self-pay | Admitting: Nurse Practitioner

## 2022-06-26 ENCOUNTER — Encounter: Payer: Self-pay | Admitting: Nurse Practitioner

## 2022-06-26 DIAGNOSIS — R748 Abnormal levels of other serum enzymes: Secondary | ICD-10-CM

## 2022-06-26 DIAGNOSIS — R5382 Chronic fatigue, unspecified: Secondary | ICD-10-CM

## 2022-06-26 DIAGNOSIS — Z6841 Body Mass Index (BMI) 40.0 and over, adult: Secondary | ICD-10-CM

## 2022-06-26 NOTE — Telephone Encounter (Signed)
[facility]

## 2022-06-26 NOTE — Assessment & Plan Note (Signed)
Blood pressures are stable today with no new symptoms at this time. She is working on ensuring she maintains hydration, specifically while on her weight loss journey. No alarm symptoms are present at this time. Orthostatics have been stable in the past. We will continue to monitor, at this time this appears to be resolved.

## 2022-06-26 NOTE — Assessment & Plan Note (Signed)
Historical. With recent 91lb weight loss, I suspect this will be improved as the elevation was likely related to fatty liver. We will monitor labs today to ensure that there are no concerning findings. Exam benign today.

## 2022-06-26 NOTE — Assessment & Plan Note (Signed)
Significant improvement of weight with management with Ozempic, diet, and exercise. We will monitor labs today. She is not having any side effects or concerning symptoms. She has had improvement in her chronic conditions with weight loss which is quite reassuring. At this time plan to continue on medication, diet, and exercise plan and make changes as needed. Recommend f/u in 6 months or sooner if needed.

## 2022-06-26 NOTE — Assessment & Plan Note (Signed)
Fatigue is still present, but not as bad as it has been. We have not checked her vitamin D or B12 in a while, which could both be contributing as she does have a history of deficiency. We will plan to follow-up based on her labs.

## 2022-06-26 NOTE — Assessment & Plan Note (Signed)
Improvement in bilateral knee pain with weight management. She is not having any new or worsening symptoms. Encourage continued exercise and weight management. If she has reoccurrence of symptoms she will follow-up.

## 2022-06-26 NOTE — Assessment & Plan Note (Addendum)
No current medications. Previous labs have been stable which is reassuring for resolution of her condition- suspect this was related to external circumstances.  Labs today for monitoring. She is on ozempic which requires monitoring, but her exam is benign today. She is not having any symptoms at this time. We plan to continue to intermittently monitor in the event that this reoccurs. No changes to the plan of care today.

## 2022-06-26 NOTE — Assessment & Plan Note (Signed)
BMI 28.92 today. I am very pleased with Gloria Lyons weight loss and maintaining this change. She is doing very well on the current regimen without any side effects. She is still working towards her goal and losing at a steady pace with healthy lifestyle changes. We discussed once she reaches her goal we will plan to continue on the medication for at least a few extra months, likely at a reduced dose to ensure she can maintain. She will follow-up if there are any concerns. We will monitor labs today. Refills provided.

## 2022-06-27 MED ORDER — WEGOVY 2.4 MG/0.75ML ~~LOC~~ SOAJ: 2.4000 mg | SUBCUTANEOUS | 3 refills | Status: DC

## 2022-06-27 NOTE — Telephone Encounter (Signed)
Will change to [facility] given her success on Ozempic. Order placed.

## 2022-06-27 NOTE — Telephone Encounter (Signed)
Ozempic denied, plan has new formulary which now requires pt have diagnosis of Type 2 Diabetes.  Options are [facility] & [facility] they will approve with P.A. if pt has BMI >30 or 27-29.9 with health issue.  Do you want to switch to [facility] or [facility]?

## 2022-06-29 ENCOUNTER — Other Ambulatory Visit (INDEPENDENT_AMBULATORY_CARE_PROVIDER_SITE_OTHER): Payer: Federal, State, Local not specified - PPO

## 2022-06-29 DIAGNOSIS — E538 Deficiency of other specified B group vitamins: Secondary | ICD-10-CM | POA: Diagnosis not present

## 2022-06-29 MED ORDER — CYANOCOBALAMIN 1000 MCG/ML IJ SOLN
1000.0000 ug | Freq: Once | INTRAMUSCULAR | Status: AC
  Administered 2022-06-29: 1000 ug via INTRAMUSCULAR

## 2022-07-22 NOTE — Telephone Encounter (Signed)
switched

## 2022-08-03 ENCOUNTER — Other Ambulatory Visit (INDEPENDENT_AMBULATORY_CARE_PROVIDER_SITE_OTHER): Payer: Federal, State, Local not specified - PPO

## 2022-08-03 DIAGNOSIS — E538 Deficiency of other specified B group vitamins: Secondary | ICD-10-CM | POA: Diagnosis not present

## 2022-08-03 MED ORDER — CYANOCOBALAMIN 1000 MCG/ML IJ SOLN
1000.0000 ug | Freq: Once | INTRAMUSCULAR | Status: AC
  Administered 2022-08-03: 1000 ug via INTRAMUSCULAR

## 2022-09-07 ENCOUNTER — Other Ambulatory Visit (INDEPENDENT_AMBULATORY_CARE_PROVIDER_SITE_OTHER): Payer: Federal, State, Local not specified - PPO

## 2022-09-07 DIAGNOSIS — E538 Deficiency of other specified B group vitamins: Secondary | ICD-10-CM | POA: Diagnosis not present

## 2022-09-07 MED ORDER — CYANOCOBALAMIN 1000 MCG/ML IJ SOLN
1000.0000 ug | Freq: Once | INTRAMUSCULAR | Status: AC
  Administered 2022-09-07: 1000 ug via INTRAMUSCULAR

## 2022-09-30 ENCOUNTER — Encounter: Payer: Self-pay | Admitting: Nurse Practitioner

## 2022-10-03 DIAGNOSIS — K080 Exfoliation of teeth due to systemic causes: Secondary | ICD-10-CM | POA: Diagnosis not present

## 2022-10-19 ENCOUNTER — Ambulatory Visit
Admission: EM | Admit: 2022-10-19 | Discharge: 2022-10-19 | Disposition: A | Discharge status: Home / Self Care | Payer: Federal, State, Local not specified - PPO | Attending: Family Medicine | Admitting: Family Medicine

## 2022-10-19 DIAGNOSIS — J020 Streptococcal pharyngitis: Secondary | ICD-10-CM | POA: Diagnosis not present

## 2022-10-19 LAB — POCT RAPID STREP A (OFFICE): Rapid Strep A Screen: POSITIVE — AB

## 2022-10-19 MED ORDER — IBUPROFEN 800 MG PO TABS
800.0000 mg | ORAL_TABLET | Freq: Once | ORAL | Status: AC
  Administered 2022-10-19: 800 mg via ORAL

## 2022-10-19 MED ORDER — AMOXICILLIN 875 MG PO TABS: 875.0000 mg | ORAL_TABLET | Freq: Two times a day (BID) | ORAL | 0 refills | Status: AC

## 2022-10-19 NOTE — Discharge Instructions (Signed)
You may use over the counter ibuprofen or acetaminophen as needed.  °For a sore throat, over the counter products such as [employer] Peroxyl Mouth Sore Rinse or Chloraseptic Sore Throat Spray may provide some temporary relief. ° ° ° ° °

## 2022-10-19 NOTE — ED Triage Notes (Addendum)
Pt c/o chills ear fullness fever and bodyaches sore throat nausea, vomiting  x 2 day for sore throat and all other symptoms started today. Pts husband and Children tested positive for strep in the last 5 days

## 2022-10-20 NOTE — ED Provider Notes (Signed)
[facility]   [id number] 10/19/22 Arrival Time: 1640  ASSESSMENT & PLAN:  1. Streptococcal sore throat    No signs of peritonsillar abscess.  Meds ordered this encounter  Medications   ibuprofen (ADVIL) tablet 800 mg   amoxicillin (AMOXIL) 875 MG tablet    Sig: Take 1 tablet (875 mg total) by mouth 2 (two) times daily for 10 days.    Dispense:  20 tablet    Refill:  0    Results for orders placed or performed during the hospital encounter of 10/19/22  POCT rapid strep A  Result Value Ref Range   Rapid Strep A Screen Positive (A) Negative   Labs Reviewed  POCT RAPID STREP A (OFFICE) - Abnormal; Notable for the following components:      Result Value   Rapid Strep A Screen Positive (*)    All other components within normal limits    OTC analgesics and throat care as needed  Instructed to finish full 10 day course of antibiotics. Will follow up if not showing significant improvement over the next 24-48 hours.    Discharge Instructions      You may use over the counter ibuprofen or acetaminophen as needed.  For a sore throat, over the counter products such as [employer] Peroxyl Mouth Sore Rinse or Chloraseptic Sore Throat Spray may provide some temporary relief.       Reviewed expectations re: course of current medical issues. Questions answered. Outlined signs and symptoms indicating need for more acute intervention. Patient verbalized understanding. After Visit Summary given.   SUBJECTIVE:  Gloria Lyons is a 32 y.o. female who reports a sore throat Pt c/o chills ear fullness fever and bodyaches sore throat nausea, vomiting  x 2 day for sore throat and all other symptoms started today. Pts husband and Children tested positive for strep in the last 5 days    OBJECTIVE:  Vitals:   10/19/22 1645  BP: 120/72  Resp: 15  Temp: (!) 102.9 F (39.4 C)  TempSrc: Oral  SpO2: 98%    General appearance: alert; no distress HEENT: throat with moderate  erythema and with exudative tonsillar hypertrophy; uvula is midline Neck: supple with FROM; cervical LAD bilat Lungs: speaks full sentences without difficulty; unlabored Abd: soft; non-tender Skin: reveals no rash; warm and dry Psychological: alert and cooperative; normal mood and affect  No Known Allergies  Past Medical History:  Diagnosis Date   Hypothyroidism    stated several years ago and does not take meds for it   Nausea and vomiting 12/22/2021   Social History   Socioeconomic History   Marital status: Married    Spouse name: Morena Gerardi   Number of children: 2   Years of education: Not on file   Highest education level: Not on file  Occupational History   Not on file  Tobacco Use   Smoking status: Never    Passive exposure: Yes   Smokeless tobacco: Never   Tobacco comments:    Both parents smoke. I never have but I had 25 years of second hand smoke.  Vaping Use   Vaping Use: Never used  Substance and Sexual Activity   Alcohol use: Yes    Comment: Maybe once every 2-3 months   Drug use: No   Sexual activity: Yes    Birth control/protection: Surgical    Comment: tubal  Other Topics Concern   Not on file  Social History Narrative   Not on file  Social Determinants of Health   Financial Resource Strain: Not on file  Food Insecurity: Not on file  Transportation Needs: Not on file  Physical Activity: Not on file  Stress: Not on file  Social Connections: Not on file  Intimate Partner Violence: Not on file   Family History  Problem Relation Age of Onset   Cancer Paternal Grandfather    Diabetes Paternal Grandfather    Cancer Maternal Grandmother    Diabetes Maternal Grandmother    Diabetes Maternal Grandfather    Leukemia Father    Hypertension Father    Alcohol abuse Father    Cancer Father    COPD Father    Depression Father    Heart disease Father    Heart attack Mother    Seizures Mother    Hypertension Mother    Anxiety disorder Mother     Depression Mother    Hypertension Paternal Aunt    Hypertension Paternal Renato Gails, MD 10/20/22 [phone]

## 2022-12-20 ENCOUNTER — Encounter: Payer: Federal, State, Local not specified - PPO | Admitting: Nurse Practitioner

## 2023-01-01 ENCOUNTER — Telehealth: Payer: Self-pay | Admitting: Nurse Practitioner

## 2023-01-01 NOTE — Telephone Encounter (Signed)
P.A. Reginal Lutes approved for year

## 2023-01-22 ENCOUNTER — Encounter: Payer: Self-pay | Admitting: Nurse Practitioner

## 2023-01-22 DIAGNOSIS — E559 Vitamin D deficiency, unspecified: Secondary | ICD-10-CM | POA: Insufficient documentation

## 2023-01-22 DIAGNOSIS — K219 Gastro-esophageal reflux disease without esophagitis: Secondary | ICD-10-CM | POA: Insufficient documentation

## 2023-01-22 HISTORY — DX: Gastro-esophageal reflux disease without esophagitis: K21.9

## 2023-01-22 NOTE — Progress Notes (Unsigned)
  Shawna Clamp, DNP, AGNP-c [facility]  [address] [place], [place] [address] [phone]  ESTABLISHED PATIENT- Chronic Health and/or Follow-Up Visit  There were no vitals taken for this visit.    Gloria Lyons is a 32 y.o. year old female presenting today for evaluation and management of chronic conditions.   HYPOTHYROIDISM Current Medication and Dosages: Symptoms present: {Symptoms; thyroid:13835} Thyroid control status:{Blank single:19197::"controlled","uncontrolled","better","worse","exacerbated","stable"} Satisfied with current treatment? {Blank single:19197::"yes","no"} Medication side effects: {Blank single:19197::"yes","no"} Medication compliance: {Blank single:19197::"excellent compliance","good compliance","fair compliance","poor compliance"} Etiology of hypothyroidism: *** Recent dose adjustment and date, if yes: {Blank single:19197::"yes","no"} *** Recent labs and date, if yes: {YES/NO:21197} ***  Other Information: ***  WEIGHT GAIN Duration: chronic Previous attempts at weight loss: {Blank single:19197::"yes","no"} Complications of obesity: HLD, prediabetes, knee pain Requesting obesity pharmacotherapy:  continuation of wegovy Current weight loss supplements/medications:  wegovy Dietary Habits: Activity/Exercise: SE of medication:     All ROS negative with exception of what is listed above.   PHYSICAL EXAM Physical Exam  PLAN Problem List Items Addressed This Visit   None   No follow-ups on file.  Time: *** minutes, >50% spent counseling, care coordination, chart review, and documentation.   Shawna Clamp, DNP, AGNP-c

## 2023-01-22 NOTE — Patient Instructions (Incomplete)
You look amazing!! I am so proud of you. We will see what your labs and hormones show and we can determine if there is a cause for some of the symptoms you are having.    WEIGHT LOSS PLANNING Your progress today shows:     10/19/2022    4:45 PM 06/21/2022    3:48 PM 04/25/2022    6:20 PM  Vitals with BMI  Height  5\' 5"    Weight  173 lbs 13 oz   BMI  28.92   Systolic 120 112 782  Diastolic 72 62 84  Pulse  72 104    For best management of weight, it is vital to balance intake versus output. This means the number of calories burned per day must be less than the calories you take in with food and drink.   I recommend trying to follow a diet with the following: Calories: 1200-1500 calories per day Carbohydrates: 150-180 grams of carbohydrates per day  Why: Gives your body enough "quick fuel" for cells to maintain normal function without sending them into starvation mode.  Protein: At least 90 grams of protein per day- 30 grams with each meal Why: Protein takes longer and uses more energy than carbohydrates to break down for fuel. The carbohydrates in your meals serves as quick energy sources and proteins help use some of that extra quick energy to break down to produce long term energy. This helps you not feel hungry as quickly and protein breakdown burns calories.  Water: Drink AT LEAST 64 ounces of water per day  Why: Water is essential to healthy metabolism. Water helps to fill the stomach and keep you fuller longer. Water is required for healthy digestion and filtering of waste in the body.  Fat: Limit fats in your diet- when choosing fats, choose foods with lower fats content such as lean meats (chicken, fish, [place]).  Why: Increased fat intake leads to storage "for later". Once you burn your carbohydrate energy, your body goes into fat and protein breakdown mode to help you loose weight.  Cholesterol: Fats and oils that are LIQUID at room temperature are best. Choose vegetable oils  (olive oil, avocado oil, nuts). Avoid fats that are SOLID at room temperature (animal fats, processed meats). Healthy fats are often found in whole grains, beans, nuts, seeds, and berries.  Why: Elevated cholesterol levels lead to build up of cholesterol on the inside of your blood vessels. This will eventually cause the blood vessels to become hard and can lead to high blood pressure and damage to your organs. When the blood flow is reduced, but the pressure is high from cholesterol buildup, parts of the cholesterol can break off and form clots that can go to the brain or heart leading to a stroke or heart attack.  Fiber: Increase amount of SOLUBLE the fiber in your diet. This helps to fill you up, lowers cholesterol, and helps with digestion. Some foods high in soluble fiber are oats, peas, beans, apples, carrots, barley, and citrus fruits.   Why: Fiber fills you up, helps remove excess cholesterol, and aids in healthy digestion which are all very important in weight management.   I recommend the following as a minimum activity routine: Purposeful walk or other physical activity at least 20 minutes every single day. This means purposefully taking a walk, jog, bike, swim, treadmill, elliptical, dance, etc.  This activity should be ABOVE your normal daily activities, such as walking at work. Goal exercise should be at  least 150 minutes a week- work your way up to this.   Heart Rate: Your maximum exercise heart rate should be 220 - Your Age in Years. When exercising, get your heart rate up, but avoid going over the maximum targeted heart rate.  60-70% of your maximum heart rate is where you tend to burn the most fat. To find this number:  220 - Age In Years= Max HR  Max HR x 0.6 (or 0.7) = Fat Burning HR The Fat Burning HR is your goal heart rate while working out to burn the most fat.  NEVER exercise to the point your feel lightheaded, weak, nauseated, dizzy. If you experience ANY of these  symptoms- STOP exercise! Allow yourself to cool down and your heart rate to come down. Then restart slower next time.  If at ANY TIME you feel chest pain or chest pressure during exercise, STOP IMMEDIATELY and seek medical attention.

## 2023-01-23 ENCOUNTER — Encounter: Payer: Self-pay | Admitting: Nurse Practitioner

## 2023-01-23 ENCOUNTER — Ambulatory Visit (INDEPENDENT_AMBULATORY_CARE_PROVIDER_SITE_OTHER): Payer: Federal, State, Local not specified - PPO | Admitting: Nurse Practitioner

## 2023-01-23 VITALS — BP 118/78 | HR 94 | Wt 160.6 lb

## 2023-01-23 DIAGNOSIS — Z6841 Body Mass Index (BMI) 40.0 and over, adult: Secondary | ICD-10-CM

## 2023-01-23 DIAGNOSIS — I951 Orthostatic hypotension: Secondary | ICD-10-CM | POA: Diagnosis not present

## 2023-01-23 DIAGNOSIS — E038 Other specified hypothyroidism: Secondary | ICD-10-CM | POA: Diagnosis not present

## 2023-01-23 DIAGNOSIS — Z1159 Encounter for screening for other viral diseases: Secondary | ICD-10-CM

## 2023-01-23 DIAGNOSIS — L659 Nonscarring hair loss, unspecified: Secondary | ICD-10-CM

## 2023-01-23 DIAGNOSIS — E559 Vitamin D deficiency, unspecified: Secondary | ICD-10-CM

## 2023-01-23 DIAGNOSIS — R5382 Chronic fatigue, unspecified: Secondary | ICD-10-CM

## 2023-01-23 DIAGNOSIS — E538 Deficiency of other specified B group vitamins: Secondary | ICD-10-CM

## 2023-01-23 DIAGNOSIS — R6882 Decreased libido: Secondary | ICD-10-CM | POA: Diagnosis not present

## 2023-01-23 DIAGNOSIS — R748 Abnormal levels of other serum enzymes: Secondary | ICD-10-CM

## 2023-01-23 DIAGNOSIS — K219 Gastro-esophageal reflux disease without esophagitis: Secondary | ICD-10-CM

## 2023-01-23 HISTORY — DX: Nonscarring hair loss, unspecified: L65.9

## 2023-01-23 MED ORDER — WEGOVY 2.4 MG/0.75ML ~~LOC~~ SOAJ: 2.4000 mg | SUBCUTANEOUS | 3 refills | Status: DC

## 2023-01-23 MED ORDER — VITAMIN D (ERGOCALCIFEROL) 1.25 MG (50000 UNIT) PO CAPS: 50000.0000 [IU] | ORAL_CAPSULE | ORAL | 3 refills | Status: DC

## 2023-01-23 MED ORDER — CYANOCOBALAMIN 1000 MCG/ML IJ SOLN
1000.0000 ug | Freq: Once | INTRAMUSCULAR | Status: AC
  Administered 2023-01-23: 1000 ug via INTRAMUSCULAR

## 2023-01-23 NOTE — Assessment & Plan Note (Signed)
BMI down to 26.73 today with 100lb weight loss noted since onset of weight loss journey. She is managing her diet and working on exercise with her regimen. Her symptoms of knee pain have resolved with weight management.  Plan: - Continue with Wegovy - Recheck labs today to determine nutritional status given her symptoms of thinning hair and nail brittleness - I am so proud of you!

## 2023-01-23 NOTE — Assessment & Plan Note (Signed)
Labs pending.  

## 2023-01-23 NOTE — Assessment & Plan Note (Signed)
History of dizziness upon rapid standing with recurrence recently. No etiology clear at this time. Suspect this could be related to hydration. Will check nutritional status give recent substantial weight loss. No syncope.  Plan: - When standing and moving go slowly and allow time for your BP to regulate.  - Be sure you are staying well hydrated  - Consider adding a little salt to your diet to see if this helps with symptoms.  - Let me know if this worsens

## 2023-01-23 NOTE — Assessment & Plan Note (Signed)
No medication at this time. She does have a small nodularity noted on the left side of the thyroid, although, this could be an enlarged lymph node. No alarm symptoms. Given her symptoms of thinning hair, decreased libido, and nail brittleness, we will recheck labs today.

## 2023-01-23 NOTE — Assessment & Plan Note (Signed)
Labs pending. Restart injections once a month.

## 2023-01-23 NOTE — Assessment & Plan Note (Signed)
History of vitamin D and B12 deficiency as well as thyroid concerns. Unclear if these are contributing to the increased fatigue noted. Also consider possible nutritional relation given her recent weight loss.  Plan: - Labs pending - Restart B12 supplement - Continue vitamin D supplement - Be sure you are getting at least 70 grams of protein a day.

## 2023-01-23 NOTE — Assessment & Plan Note (Signed)
Repeat labs:

## 2023-01-23 NOTE — Assessment & Plan Note (Signed)
Decreased libido over the last couple of years. No other symptoms present. She enjoys intimacy once initiated, but lacks the drive/desire to initiate despite love for her husband. Consider possible hormonal or stress aspect. Plan: - Monitor labs today to ensure that thyroid and hormones are well balanced - Set aside time for intimacy - Consider "dating" husband and providing time for just the two of you to flirt

## 2023-01-23 NOTE — Assessment & Plan Note (Signed)
No symptoms at this time.  

## 2023-01-24 ENCOUNTER — Telehealth: Payer: Self-pay | Admitting: Nurse Practitioner

## 2023-01-24 NOTE — Telephone Encounter (Signed)
(  Key: [id number]) Rx #: [id number] UXLKGM 2.4MG /0.75ML auto-injectors Form [employer] Electronic PA Form (2017 NCPDP) Created 1 month ago Your prior authorization for Reginal Lutes has been approved! MORE INFO Personalized support and financial assistance may be available through the [employer] program. For more information, and to see program requirements, click on the More Info button to the right.  Message from plan: Your PA request has been approved. Additional information will be provided in the approval communication. (Message 1145). Authorization Expiration Date: January 01, 2024.

## 2023-02-06 ENCOUNTER — Encounter: Payer: Self-pay | Admitting: Nurse Practitioner

## 2023-02-06 ENCOUNTER — Telehealth: Payer: Federal, State, Local not specified - PPO | Admitting: Nurse Practitioner

## 2023-02-06 VITALS — Wt 160.0 lb

## 2023-02-06 DIAGNOSIS — E349 Endocrine disorder, unspecified: Secondary | ICD-10-CM | POA: Diagnosis not present

## 2023-02-06 MED ORDER — NONFORMULARY OR COMPOUNDED ITEM: 1 refills | Status: DC

## 2023-02-06 NOTE — Assessment & Plan Note (Signed)
Recent labs show lower than expected testosterone levels. I suspect that this is the trigger for many of the symptoms she has been experiencing. We discussed that testosterone therapy is not FDA approved for women, but there is evidence to show that compounded testosterone can be beneficial to help with symptoms similar to hers. We discussed side effects, benefits, application, monitoring, and stopping the medication. She would like to proceed.  Plan: - I will send topical testosterone to [facility] on [facility] with 0.68mL 2 times a week applied to the inner thigh. Be sure to keep this area covered with clothing to prevent children from absorbing the medication.  - Rub the cream into the skin and wash your hands well.  - Keep the cream out of reach of children.  - We will plan to check your levels with your next B12 injection.

## 2023-02-06 NOTE — Patient Instructions (Signed)
I have sent the [facility] on [address].   Apply 2-3 times a week to the inner thigh.  Let me know if you have any concerns.

## 2023-02-06 NOTE — Progress Notes (Signed)
Virtual Visit Encounter mychart visit.   I connected with  Gloria Lyons on 02/06/23 at  8:15 AM EDT by secure video and audio telemedicine application. I verified that I am speaking with the correct person using two identifiers.   I introduced myself as a Publishing rights manager with the practice. The limitations of evaluation and management by telemedicine discussed with the patient and the availability of in person appointments. The patient expressed verbal understanding and consent to proceed.  Participating parties in this visit include: Myself and patient  The patient is: Patient Location: Home I am: Provider Location: Office/Clinic Subjective:    CC and HPI: Gloria Lyons is a 32 y.o. year old female presenting for follow up of low testosterone.  Gloria Lyons has been dealing with low energy, low libido, thinning hair, and low mood for several months. Recent labs showed testosterone levels low. She would like to discuss options for treatment.   Past medical history, Surgical history, Family history not pertinant except as noted below, Social history, Allergies, and medications have been entered into the medical record, reviewed, and corrections made.   Review of Systems:  All review of systems negative except what is listed in the HPI  Objective:    Alert and oriented x 4 Speaking in clear sentences with no shortness of breath. No distress.  Impression and Recommendations:    Problem List Items Addressed This Visit     Testosterone insufficiency - Primary    Recent labs show lower than expected testosterone levels. I suspect that this is the trigger for many of the symptoms she has been experiencing. We discussed that testosterone therapy is not FDA approved for women, but there is evidence to show that compounded testosterone can be beneficial to help with symptoms similar to hers. We discussed side effects, benefits, application, monitoring, and stopping the medication. She would  like to proceed.  Plan: - I will send topical testosterone to [facility] on [facility] with 0.56mL 2 times a week applied to the inner thigh. Be sure to keep this area covered with clothing to prevent children from absorbing the medication.  - Rub the cream into the skin and wash your hands well.  - Keep the cream out of reach of children.  - We will plan to check your levels with your next B12 injection.       Relevant Medications   NONFORMULARY OR COMPOUNDED ITEM    orders and follow up as documented in EMR I discussed the assessment and treatment plan with the patient. The patient was provided an opportunity to ask questions and all were answered. The patient agreed with the plan and demonstrated an understanding of the instructions.   The patient was advised to call back or seek an in-person evaluation if the symptoms worsen or if the condition fails to improve as anticipated.  Follow-Up: in a month  I provided 10 minutes of non-face-to-face interaction with this non face-to-face encounter including intake, same-day documentation, and chart review.   Tollie Eth, NP , DNP, AGNP-c  [facility]

## 2023-02-22 ENCOUNTER — Other Ambulatory Visit (INDEPENDENT_AMBULATORY_CARE_PROVIDER_SITE_OTHER): Payer: Federal, State, Local not specified - PPO

## 2023-02-22 ENCOUNTER — Telehealth: Payer: Self-pay | Admitting: Nurse Practitioner

## 2023-02-22 DIAGNOSIS — E349 Endocrine disorder, unspecified: Secondary | ICD-10-CM | POA: Diagnosis not present

## 2023-02-22 DIAGNOSIS — E538 Deficiency of other specified B group vitamins: Secondary | ICD-10-CM | POA: Diagnosis not present

## 2023-02-22 MED ORDER — CYANOCOBALAMIN 1000 MCG/ML IJ SOLN
1000.0000 ug | Freq: Once | INTRAMUSCULAR | Status: AC
  Administered 2023-02-22: 1000 ug via INTRAMUSCULAR

## 2023-02-22 NOTE — Telephone Encounter (Signed)
Pt was in today for B12 injection and coming back 10/9  She wanted to know when she needs to do bloodwork again

## 2023-02-27 LAB — TESTOSTERONE, TOTAL, LC/MS/MS: Testosterone, total: 17.4 ng/dL (ref 10.0–55.0)

## 2023-03-03 NOTE — Telephone Encounter (Signed)
Blood work 4 months from first starting the injection.

## 2023-03-22 ENCOUNTER — Other Ambulatory Visit: Payer: Federal, State, Local not specified - PPO

## 2023-03-30 ENCOUNTER — Other Ambulatory Visit (INDEPENDENT_AMBULATORY_CARE_PROVIDER_SITE_OTHER): Payer: Federal, State, Local not specified - PPO

## 2023-03-30 DIAGNOSIS — E538 Deficiency of other specified B group vitamins: Secondary | ICD-10-CM

## 2023-03-30 MED ORDER — CYANOCOBALAMIN 1000 MCG/ML IJ SOLN
1000.0000 ug | Freq: Once | INTRAMUSCULAR | Status: AC
  Administered 2023-03-30: 1000 ug via INTRAMUSCULAR

## 2023-03-30 NOTE — Addendum Note (Signed)
Addended by: Debbrah Alar F on: 03/30/2023 03:43 PM   Modules accepted: Orders

## 2023-03-30 NOTE — Addendum Note (Signed)
Addended by: Debbrah Alar F on: 03/30/2023 03:47 PM   Modules accepted: Orders

## 2023-04-26 ENCOUNTER — Other Ambulatory Visit (INDEPENDENT_AMBULATORY_CARE_PROVIDER_SITE_OTHER): Payer: Federal, State, Local not specified - PPO

## 2023-04-26 DIAGNOSIS — E538 Deficiency of other specified B group vitamins: Secondary | ICD-10-CM | POA: Diagnosis not present

## 2023-04-26 MED ORDER — CYANOCOBALAMIN 1000 MCG/ML IJ SOLN
1000.0000 ug | Freq: Once | INTRAMUSCULAR | Status: AC
  Administered 2023-04-26: 1000 ug via INTRAMUSCULAR

## 2023-05-18 ENCOUNTER — Encounter: Payer: Self-pay | Admitting: Nurse Practitioner

## 2023-05-24 ENCOUNTER — Other Ambulatory Visit (INDEPENDENT_AMBULATORY_CARE_PROVIDER_SITE_OTHER): Payer: Federal, State, Local not specified - PPO

## 2023-05-24 DIAGNOSIS — E349 Endocrine disorder, unspecified: Secondary | ICD-10-CM | POA: Diagnosis not present

## 2023-05-24 DIAGNOSIS — E538 Deficiency of other specified B group vitamins: Secondary | ICD-10-CM

## 2023-05-24 MED ORDER — CYANOCOBALAMIN 1000 MCG/ML IJ SOLN
1000.0000 ug | Freq: Once | INTRAMUSCULAR | Status: AC
  Administered 2023-05-24: 1000 ug via INTRAMUSCULAR

## 2023-05-27 LAB — TESTOSTERONE, TOTAL, LC/MS/MS: Testosterone, total: 46.6 ng/dL (ref 10.0–55.0)

## 2023-05-30 ENCOUNTER — Encounter: Payer: Self-pay | Admitting: Nurse Practitioner

## 2023-06-21 ENCOUNTER — Other Ambulatory Visit (INDEPENDENT_AMBULATORY_CARE_PROVIDER_SITE_OTHER): Payer: BC Managed Care – PPO

## 2023-06-21 DIAGNOSIS — E538 Deficiency of other specified B group vitamins: Secondary | ICD-10-CM | POA: Diagnosis not present

## 2023-06-21 MED ORDER — CYANOCOBALAMIN 1000 MCG/ML IJ SOLN
1000.0000 ug | Freq: Once | INTRAMUSCULAR | Status: AC
  Administered 2023-06-21: 1000 ug via INTRAMUSCULAR

## 2023-06-22 ENCOUNTER — Encounter: Payer: Self-pay | Admitting: Nurse Practitioner

## 2023-06-22 DIAGNOSIS — R748 Abnormal levels of other serum enzymes: Secondary | ICD-10-CM

## 2023-06-22 DIAGNOSIS — Z6841 Body Mass Index (BMI) 40.0 and over, adult: Secondary | ICD-10-CM

## 2023-06-23 MED ORDER — SEMAGLUTIDE (2 MG/DOSE) 8 MG/3ML ~~LOC~~ SOPN: 2.0000 mg | PEN_INJECTOR | SUBCUTANEOUS | 2 refills | Status: DC

## 2023-06-28 ENCOUNTER — Telehealth: Payer: Self-pay

## 2023-06-28 NOTE — Telephone Encounter (Signed)
 Hello,  Documentation confirming a diagnosis of Type 2 diabetes would be ''Needed''. Without chart documentation of Type 2 diabetes, the P/A cannot be approved, as Ozempic  is only FDA-approved for Type 2 diabetes patients. Additionally, insurance companies have become more stringent in approving these types of medications. Consideration may be given to switching to Wegovy  or Zepbound.    *please see previous chart notes as this medication ''Ozempic '' was inquired about.

## 2023-07-19 ENCOUNTER — Other Ambulatory Visit (INDEPENDENT_AMBULATORY_CARE_PROVIDER_SITE_OTHER): Payer: Federal, State, Local not specified - PPO

## 2023-07-19 DIAGNOSIS — E538 Deficiency of other specified B group vitamins: Secondary | ICD-10-CM

## 2023-07-19 MED ORDER — CYANOCOBALAMIN 1000 MCG/ML IJ SOLN
1000.0000 ug | Freq: Once | INTRAMUSCULAR | Status: AC
  Administered 2023-07-19: 1000 ug via INTRAMUSCULAR

## 2023-08-14 ENCOUNTER — Encounter: Payer: Federal, State, Local not specified - PPO | Admitting: Family Medicine

## 2023-08-16 ENCOUNTER — Encounter: Payer: Federal, State, Local not specified - PPO | Admitting: Nurse Practitioner

## 2023-08-22 ENCOUNTER — Telehealth: Payer: Self-pay | Admitting: Internal Medicine

## 2023-08-22 NOTE — Telephone Encounter (Signed)
 Order Details  Medication NDC Prescription # DAW  TESTOSTERONE AVB ( ) 1% (10MG /ML) CREAM 1% (10MG /ML) CREAM Supply [id number] No  Written Date Last Fill Date Quantity Days Supply  02/06/2023 05/16/2023 4 each 90  Sig Notes  Apply 0.35ml topically 2 to 3 times per week. Not Available   Pharmacy Information  Pharmacy NCPDP ID Address Phone  [facility] [phone] [address] [place] [address] [phone] [facility])

## 2023-08-23 ENCOUNTER — Telehealth: Payer: Self-pay

## 2023-08-23 ENCOUNTER — Other Ambulatory Visit

## 2023-08-23 DIAGNOSIS — E538 Deficiency of other specified B group vitamins: Secondary | ICD-10-CM

## 2023-08-23 DIAGNOSIS — E349 Endocrine disorder, unspecified: Secondary | ICD-10-CM

## 2023-08-23 MED ORDER — CYANOCOBALAMIN 1000 MCG/ML IJ SOLN
1000.0000 ug | Freq: Once | INTRAMUSCULAR | Status: AC
  Administered 2023-08-23: 1000 ug via INTRAMUSCULAR

## 2023-08-23 MED ORDER — NONFORMULARY OR COMPOUNDED ITEM: 1 refills | Status: AC

## 2023-08-23 NOTE — Telephone Encounter (Signed)
 Pt. Was her for her B-12 shot and wanted to know if you could fill her testosterone to [facility].

## 2023-09-20 ENCOUNTER — Other Ambulatory Visit (INDEPENDENT_AMBULATORY_CARE_PROVIDER_SITE_OTHER)

## 2023-09-20 DIAGNOSIS — E538 Deficiency of other specified B group vitamins: Secondary | ICD-10-CM

## 2023-09-20 MED ORDER — CYANOCOBALAMIN 1000 MCG/ML IJ SOLN
1000.0000 ug | Freq: Once | INTRAMUSCULAR | Status: AC
  Administered 2023-09-20: 1000 ug via INTRAMUSCULAR

## 2023-10-18 ENCOUNTER — Other Ambulatory Visit (INDEPENDENT_AMBULATORY_CARE_PROVIDER_SITE_OTHER)

## 2023-10-18 DIAGNOSIS — E538 Deficiency of other specified B group vitamins: Secondary | ICD-10-CM | POA: Diagnosis not present

## 2023-10-18 MED ORDER — CYANOCOBALAMIN 1000 MCG/ML IJ SOLN
1000.0000 ug | Freq: Once | INTRAMUSCULAR | Status: AC
  Administered 2023-10-18: 1000 ug via INTRAMUSCULAR

## 2023-11-20 ENCOUNTER — Ambulatory Visit: Admitting: Nurse Practitioner

## 2023-11-20 ENCOUNTER — Encounter: Payer: Self-pay | Admitting: Nurse Practitioner

## 2023-11-20 VITALS — BP 120/80 | HR 81 | Ht 65.0 in | Wt 208.6 lb

## 2023-11-20 DIAGNOSIS — Z6841 Body Mass Index (BMI) 40.0 and over, adult: Secondary | ICD-10-CM | POA: Diagnosis not present

## 2023-11-20 DIAGNOSIS — N926 Irregular menstruation, unspecified: Secondary | ICD-10-CM | POA: Diagnosis not present

## 2023-11-20 DIAGNOSIS — E038 Other specified hypothyroidism: Secondary | ICD-10-CM

## 2023-11-20 DIAGNOSIS — E559 Vitamin D deficiency, unspecified: Secondary | ICD-10-CM

## 2023-11-20 DIAGNOSIS — R748 Abnormal levels of other serum enzymes: Secondary | ICD-10-CM

## 2023-11-20 DIAGNOSIS — B351 Tinea unguium: Secondary | ICD-10-CM

## 2023-11-20 DIAGNOSIS — Z0000 Encounter for general adult medical examination without abnormal findings: Secondary | ICD-10-CM

## 2023-11-20 DIAGNOSIS — E538 Deficiency of other specified B group vitamins: Secondary | ICD-10-CM | POA: Diagnosis not present

## 2023-11-20 DIAGNOSIS — E349 Endocrine disorder, unspecified: Secondary | ICD-10-CM

## 2023-11-20 DIAGNOSIS — F902 Attention-deficit hyperactivity disorder, combined type: Secondary | ICD-10-CM

## 2023-11-20 DIAGNOSIS — N921 Excessive and frequent menstruation with irregular cycle: Secondary | ICD-10-CM

## 2023-11-20 LAB — LIPID PANEL

## 2023-11-20 MED ORDER — AMPHETAMINE-DEXTROAMPHETAMINE 10 MG PO TABS: 10.0000 mg | ORAL_TABLET | Freq: Two times a day (BID) | ORAL | 0 refills | Status: DC

## 2023-11-20 MED ORDER — CICLOPIROX 8 % EX SOLN
Freq: Every day | CUTANEOUS | 3 refills | Status: AC
Start: 2023-11-20 — End: ?

## 2023-11-20 MED ORDER — CYANOCOBALAMIN 1000 MCG/ML IJ SOLN
1000.0000 ug | Freq: Once | INTRAMUSCULAR | Status: AC
Start: 2023-11-20 — End: 2023-11-20
  Administered 2023-11-20: 1000 ug via INTRAMUSCULAR

## 2023-11-20 NOTE — Patient Instructions (Addendum)
 Let me know how you are doing on the ADHD medication in about a month and if we need to make any changes to the medication we can do that. I would like to see how you are doing in about 3 months.   WEIGHT LOSS PLANNING Your progress today shows:     11/20/2023   10:18 AM 02/06/2023    8:06 AM 01/23/2023    8:22 AM  Vitals with BMI  Height 5\' 5"     Weight 208 lbs 10 oz 160 lbs 160 lbs 10 oz  BMI 34.71    Systolic 120  118  Diastolic 80  78  Pulse 81  94    For best management of weight, it is vital to balance intake versus output. This means the number of calories burned per day must be less than the calories you take in with food and drink.   I recommend trying to follow a diet with the following: Calories: 1200-1500 calories per day Carbohydrates: 150-180 grams of carbohydrates per day  Why: Gives your body enough "quick fuel" for cells to maintain normal function without sending them into starvation mode.  Protein: At least 90 grams of protein per day- 30 grams with each meal Why: Protein takes longer and uses more energy than carbohydrates to break down for fuel. The carbohydrates in your meals serves as quick energy sources and proteins help use some of that extra quick energy to break down to produce long term energy. This helps you not feel hungry as quickly and protein breakdown burns calories.  Water: Drink AT LEAST 64 ounces of water per day  Why: Water is essential to healthy metabolism. Water helps to fill the stomach and keep you fuller longer. Water is required for healthy digestion and filtering of waste in the body.  Fat: Limit fats in your diet- when choosing fats, choose foods with lower fats content such as lean meats (chicken, fish, [place]).  Why: Increased fat intake leads to storage "for later". Once you burn your carbohydrate energy, your body goes into fat and protein breakdown mode to help you loose weight.  Cholesterol: Fats and oils that are LIQUID at room  temperature are best. Choose vegetable oils (olive oil, avocado oil, nuts). Avoid fats that are SOLID at room temperature (animal fats, processed meats). Healthy fats are often found in whole grains, beans, nuts, seeds, and berries.  Why: Elevated cholesterol levels lead to build up of cholesterol on the inside of your blood vessels. This will eventually cause the blood vessels to become hard and can lead to high blood pressure and damage to your organs. When the blood flow is reduced, but the pressure is high from cholesterol buildup, parts of the cholesterol can break off and form clots that can go to the brain or heart leading to a stroke or heart attack.  Fiber: Increase amount of SOLUBLE the fiber in your diet. This helps to fill you up, lowers cholesterol, and helps with digestion. Some foods high in soluble fiber are oats, peas, beans, apples, carrots, barley, and citrus fruits.   Why: Fiber fills you up, helps remove excess cholesterol, and aids in healthy digestion which are all very important in weight management.   I recommend the following as a minimum activity routine: Purposeful walk or other physical activity at least 20 minutes every single day. This means purposefully taking a walk, jog, bike, swim, treadmill, elliptical, dance, etc.  This activity should be ABOVE your normal daily activities,  such as walking at work. Goal exercise should be at least 150 minutes a week- work your way up to this.   Heart Rate: Your maximum exercise heart rate should be 220 - Your Age in Years. When exercising, get your heart rate up, but avoid going over the maximum targeted heart rate.  60-70% of your maximum heart rate is where you tend to burn the most fat. To find this number:  220 - Age In Years= Max HR  Max HR x 0.6 (or 0.7) = Fat Burning HR The Fat Burning HR is your goal heart rate while working out to burn the most fat.  NEVER exercise to the point your feel lightheaded, weak, nauseated,  dizzy. If you experience ANY of these symptoms- STOP exercise! Allow yourself to cool down and your heart rate to come down. Then restart slower next time.  If at ANY TIME you feel chest pain or chest pressure during exercise, STOP IMMEDIATELY and seek medical attention.

## 2023-11-20 NOTE — Progress Notes (Signed)
 Dell Fennel, DNP, AGNP-c [facility] [address] [place], [place] [address] Main Office [phone]  BP 120/80   Pulse 81   Ht 5' 5 (1.651 m)   Wt 208 lb 9.6 oz (94.6 kg)   SpO2 98%   BMI 34.71 kg/m    Subjective:    Patient ID: Gloria Lyons, female    DOB: 03/05/1991, 33 y.o.   MRN: [id number]  HPI:   Ask about Pap Needs cbc, cmp, lipid, B12, vit d Unable to tolerate phentermine, tried in 2015-2016 but unable to tolerate due to severe anxiety.  Also tried contrave and did not have success. Has tried Orlistat over the counter and had severe GI distress.   History of Present Illness Gloria Lyons is a 33 year old female here for CPE who presents with additional concerns about weight management, ADHD symptoms, menstrual irregularities, and a suspected fungal infection.  She discontinued Wegovy  due to increased cost, leading to a resurgence of hunger and cravings. She has previously tried phentermine, which caused anxiety, and orlistat, which she used over-the-counter without success. She is interested in other options.   She experiences symptoms consistent with ADHD, including difficulty organizing tasks, completing projects, and maintaining focus, especially in an office setting. She has a history of being prescribed Adderall 5 mg IR, which she did not find effective. She is interested in additional options.   She reports menstrual irregularities with periods becoming closer together, approximately every 20-25 days, and describes pain during urination particularly during menstruation. Her periods are characterized by two days of heavy flow followed by lighter days. She is concerned about the possibility of perimenopause due to her menstrual changes, especially given her family history of Berthel Bagnall menopause, with her mother entering menopause in her late forties.  She mentions a suspected fungal infection on her toenail, which she attributes to prolonged  gel polish use. She has been using clotrimazole for two months without noticeable improvement.  No chest pain, shortness of breath, dizziness, or recent illness. Reports normal heart sounds and no difficulty swallowing.   Pertinent items are noted in HPI.   Most Recent Depression Screen:     11/20/2023   10:18 AM 09/27/2021    1:34 PM 07/01/2021    7:50 PM 08/27/2018    3:24 PM 02/21/2017    2:28 PM  Depression screen PHQ 2/9  Decreased Interest 0 1 0 0 0  Down, Depressed, Hopeless 0 1 0 0 0  PHQ - 2 Score 0 2 0 0 0  Altered sleeping  0  1 0  Tired, decreased energy  1  1 0  Change in appetite  0  0 0  Feeling bad or failure about yourself   1  0 0  Trouble concentrating  0  0 0  Moving slowly or fidgety/restless  0  0 0  Suicidal thoughts  0  0 0  PHQ-9 Score  4  2 0  Difficult doing work/chores  Not difficult at all      Most Recent Anxiety Screen:      No data to display         Most Recent Fall Screen:    11/20/2023   10:18 AM 09/27/2021    1:34 PM 07/01/2021    7:50 PM 08/27/2018    3:23 PM 08/09/2018    2:50 PM  Fall Risk   Falls in the past year? 0 0 0 0  0   Number falls in past  yr: 0 0 0 0    Injury with Fall? 0 0 0 0   Risk for fall due to : No Fall Risks No Fall Risks No Fall Risks    Follow up Falls evaluation completed Falls evaluation completed;Education provided  Falls evaluation completed        Data saved with a previous flowsheet row definition    Past medical history, surgical history, medications, allergies, family history and social history reviewed with patient today and changes made to appropriate areas of the chart.  Past Medical History:  Past Medical History:  Diagnosis Date   Chronic pain of both knees 07/01/2021   Encounter for annual physical exam 02/15/2019   Gastroesophageal reflux disease 01/22/2023   H/O preeclampsia 08/24/2017   Mild    Baseline labs: nl @ 15wks     ASA 162mg  [x]      Hypothyroidism    stated several years ago and  does not take meds for it   Nausea and vomiting 12/22/2021   Thinning hair 01/23/2023   Etiology unknown. Labs pending     Weight gain 07/01/2021   Medications:  Current Outpatient Medications on File Prior to Visit  Medication Sig   cholecalciferol (VITAMIN D3) 25 MCG (1000 UNIT) tablet Take 1,000 Units by mouth daily.   NONFORMULARY OR COMPOUNDED ITEM Topical testosterone  cream 1mg /0.97ml.  Apply topically 0.1ml topically two to three times weekly.  Disp: 3 month supply.  #1RF.   Vitamin D , Ergocalciferol , (DRISDOL ) 1.25 MG (50000 UNIT) CAPS capsule Take 1 capsule (50,000 Units total) by mouth every 7 (seven) days.   No current facility-administered medications on file prior to visit.   Surgical History:  Past Surgical History:  Procedure Laterality Date   NO PAST SURGERIES     TUBAL LIGATION N/A 02/08/2019   Procedure: POST PARTUM TUBAL LIGATION;  Surgeon: Rik Chasten, MD;  Location: [facility] ORS;  Service: Gynecology;  Laterality: N/A;   Allergies:  No Known Allergies Family History:  Family History  Problem Relation Age of Onset   Cancer Paternal Grandfather    Diabetes Paternal Grandfather    Cancer Maternal Grandmother    Diabetes Maternal Grandmother    Diabetes Maternal Grandfather    Leukemia Father    Hypertension Father    Alcohol abuse Father    Cancer Father    COPD Father    Depression Father    Heart disease Father    Heart attack Mother    Seizures Mother    Hypertension Mother    Anxiety disorder Mother    Depression Mother    Hypertension Paternal Aunt    Hypertension Paternal Aunt        Objective:    BP 120/80   Pulse 81   Ht 5' 5 (1.651 m)   Wt 208 lb 9.6 oz (94.6 kg)   SpO2 98%   BMI 34.71 kg/m   Wt Readings from Last 3 Encounters:  11/20/23 208 lb 9.6 oz (94.6 kg)  02/06/23 160 lb (72.6 kg)  01/23/23 160 lb 9.6 oz (72.8 kg)    Physical Exam Constitutional:      General: She is not in acute distress.    Appearance: She is  obese.  HENT:     Head: Normocephalic.     Right Ear: Tympanic membrane normal.     Left Ear: Tympanic membrane normal.     Nose: Nose normal.     Mouth/Throat:     Mouth: Mucous membranes are  moist.     Pharynx: Oropharynx is clear.   Eyes:     Extraocular Movements: Extraocular movements intact.     Conjunctiva/sclera: Conjunctivae normal.   Neck:     Vascular: No carotid bruit.   Cardiovascular:     Rate and Rhythm: Normal rate and regular rhythm.     Pulses: Normal pulses.     Heart sounds: Normal heart sounds.  Pulmonary:     Effort: Pulmonary effort is normal.     Breath sounds: Normal breath sounds.  Abdominal:     General: Bowel sounds are normal. There is no distension.     Palpations: Abdomen is soft. There is no mass.     Tenderness: There is no abdominal tenderness. There is no right CVA tenderness, left CVA tenderness or guarding.   Musculoskeletal:        General: Normal range of motion.     Cervical back: Normal range of motion.     Right lower leg: No edema.     Left lower leg: No edema.  Lymphadenopathy:     Cervical: Cervical adenopathy present.   Skin:    General: Skin is warm and dry.     Capillary Refill: Capillary refill takes less than 2 seconds.   Neurological:     General: No focal deficit present.     Mental Status: She is alert and oriented to person, place, and time.     Sensory: No sensory deficit.     Motor: No weakness.     Coordination: Coordination normal.   Psychiatric:        Mood and Affect: Mood normal.        Behavior: Behavior normal.      Results for orders placed or performed in visit on 11/20/23  TSH   Collection Time: 11/20/23 11:38 AM  Result Value Ref Range   TSH 1.820 0.450 - 4.500 uIU/mL  T4, free   Collection Time: 11/20/23 11:38 AM  Result Value Ref Range   Free T4 1.20 0.82 - 1.77 ng/dL  Estradiol    Collection Time: 11/20/23 11:38 AM  Result Value Ref Range   Estradiol  106.0 pg/mL  FSH/LH    Collection Time: 11/20/23 11:38 AM  Result Value Ref Range   LH 7.6 mIU/mL   FSH 2.9 mIU/mL  CBC with Differential/Platelet   Collection Time: 11/20/23 11:40 AM  Result Value Ref Range   WBC 6.3 3.4 - 10.8 x10E3/uL   RBC 4.72 3.77 - 5.28 x10E6/uL   Hemoglobin 13.7 11.1 - 15.9 g/dL   Hematocrit 28.4 13.2 - 46.6 %   MCV 88 79 - 97 fL   MCH 29.0 26.6 - 33.0 pg   MCHC 32.9 31.5 - 35.7 g/dL   RDW 44.0 10.2 - 72.5 %   Platelets 240 150 - 450 x10E3/uL   Neutrophils 67 Not Estab. %   Lymphs 22 Not Estab. %   Monocytes 7 Not Estab. %   Eos 2 Not Estab. %   Basos 1 Not Estab. %   Neutrophils Absolute 4.3 1.4 - 7.0 x10E3/uL   Lymphocytes Absolute 1.4 0.7 - 3.1 x10E3/uL   Monocytes Absolute 0.4 0.1 - 0.9 x10E3/uL   EOS (ABSOLUTE) 0.1 0.0 - 0.4 x10E3/uL   Basophils Absolute 0.0 0.0 - 0.2 x10E3/uL   Immature Granulocytes 0 Not Estab. %   Immature Grans (Abs) 0.0 0.0 - 0.1 x10E3/uL  Comprehensive metabolic panel with GFR   Collection Time: 11/20/23 11:40 AM  Result Value Ref Range  Glucose 87 70 - 99 mg/dL   BUN 13 6 - 20 mg/dL   Creatinine, Ser 1.61 0.57 - 1.00 mg/dL   eGFR 97 >09 UE/AVW/0.98   BUN/Creatinine Ratio 16 9 - 23   Sodium 138 134 - 144 mmol/L   Potassium 4.6 3.5 - 5.2 mmol/L   Chloride 104 96 - 106 mmol/L   CO2 20 20 - 29 mmol/L   Calcium 9.4 8.7 - 10.2 mg/dL   Total Protein 7.2 6.0 - 8.5 g/dL   Albumin 4.5 3.9 - 4.9 g/dL   Globulin, Total 2.7 1.5 - 4.5 g/dL   Bilirubin Total 0.4 0.0 - 1.2 mg/dL   Alkaline Phosphatase 49 44 - 121 IU/L   AST 12 0 - 40 IU/L   ALT 10 0 - 32 IU/L  Lipid panel   Collection Time: 11/20/23 11:40 AM  Result Value Ref Range   Cholesterol, Total 158 100 - 199 mg/dL   Triglycerides 76 0 - 149 mg/dL   HDL 65 >11 mg/dL   VLDL Cholesterol Cal 15 5 - 40 mg/dL   LDL Chol Calc (NIH) 78 0 - 99 mg/dL   Chol/HDL Ratio 2.4 0.0 - 4.4 ratio  VITAMIN D  25 Hydroxy (Vit-D Deficiency, Fractures)   Collection Time: 11/20/23 11:40 AM  Result Value Ref  Range   Vit D, 25-Hydroxy 57.4 30.0 - 100.0 ng/mL  Vitamin B12   Collection Time: 11/20/23 11:40 AM  Result Value Ref Range   Vitamin B-12 384 232 - 1,245 pg/mL  Testosterone , Total, LC/MS/MS   Collection Time: 11/20/23 11:40 AM  Result Value Ref Range   Testosterone , total 17.2 10.0 - 55.0 ng/dL       Assessment & Plan:   Problem List Items Addressed This Visit     Hypothyroidism   Labs have been normal historically. Not currently on medication. Will continue to monitor.       Relevant Orders   TSH (Completed)   T4, free (Completed)   Encounter for annual physical exam - Primary   CPE completed today. Review of HM activities and recommendations discussed and provided on AVS. Anticipatory guidance, diet, and exercise recommendations provided. Medications, allergies, and hx reviewed and updated as necessary. Orders placed as listed below.  Plan: - Labs ordered. Will make changes as necessary based on results.  - I will review these results and send recommendations via MyChart or a telephone call.  - F/U with CPE in 1 year or sooner for acute/chronic health needs as directed.        Elevated liver enzymes   Resolved with normal values at this time. No changes.       Relevant Orders   Comprehensive metabolic panel with GFR (Completed)   Lipid panel (Completed)   BMI 40.0-44.9, adult (HCC)   Successful weight loss of over 100lbs with Wegovy  in the past. She faces challenges with weight management after discontinuing Wegovy  due to cost, experiencing increased hunger and cravings. Previous trials of phentermine caused anxiety, and orlistat was ineffective. Orlistat may not be well tolerated due to common intolerance among patients. - Prescribe orlistat 120 mg for trial. - Document previous trials of phentermine and orlistat for insurance purposes. - Consider tier reduction for Wegovy  if orlistat is ineffective.       Relevant Medications   amphetamine -dextroamphetamine   (ADDERALL) 10 MG tablet   Other Relevant Orders   CBC with Differential/Platelet (Completed)   Comprehensive metabolic panel with GFR (Completed)   Lipid panel (Completed)  B12 deficiency   She is due for a B12 shot as part of routine health maintenance. - Administer B12 shot      Relevant Orders   Vitamin B12 (Completed)   Testosterone  insufficiency   Repeat labs today. Currently managing with topical compounded cream on thigh twice a week.       Relevant Orders   Testosterone , Total, LC/MS/MS (Completed)   TSH (Completed)   T4, free (Completed)   Estradiol  (Completed)   FSH/LH (Completed)   Attention deficit hyperactivity disorder (ADHD), combined type   She experiences significant difficulty with focus and organization, particularly in an office setting. ADHD symptoms have been present since childhood but were not formally diagnosed until adulthood. Previous trial of Adderall 5 mg IR was ineffective. Discussed benefits of Adderall, including short-acting and long-acting forms, and the importance of finding the right dosage and formulation. Short-acting Adderall may vary in duration from 2 to 8 hours depending on individual response. - Prescribe Adderall 10 mg with instructions to take up to twice a day, allowing flexibility in dosing. - Follow up in about a month to assess effectiveness and adjust dosage if necessary.      Relevant Medications   amphetamine -dextroamphetamine  (ADDERALL) 10 MG tablet   Irregular menses   Menstrual cycles are becoming closer together, with increased pain during urination in certain positions, and heavy bleeding for two days followed by lighter flow. Suspected uterine cramping related to bladder movement, but further evaluation is necessary to rule out fibroids or abnormal uterine lining. Discussed possibility of thyroid  issues contributing to symptoms. - Order pelvic ultrasound to evaluate for fibroids or abnormal uterine lining. - Check thyroid   function and other relevant labs to rule out endocrine causes.      Relevant Orders   TSH (Completed)   T4, free (Completed)   Estradiol  (Completed)   FSH/LH (Completed)   US  PELVIC COMPLETE WITH TRANSVAGINAL (Completed)   Toenail fungus   Suspected fungal infection of the toenail, likely acquired from a nail salon. Clotrimazole use has been ineffective. Discussed slow nature of toenail fungal treatment and need for prescription-strength medication. - Prescribe Penlac  (ciclopirox ) topical solution for toenail fungus. - Instruct on application: paint one coat daily, remove with alcohol swab after seven days, and continue until clear growth is observed.      Relevant Medications   ciclopirox  (PENLAC ) 8 % solution   Vitamin D  deficiency   Relevant Orders   VITAMIN D  25 Hydroxy (Vit-D Deficiency, Fractures) (Completed)   Other Visit Diagnoses       Menorrhagia with irregular cycle       Relevant Orders   US  PELVIC COMPLETE WITH TRANSVAGINAL (Completed)         Follow up plan: Return in about 4 months (around 03/21/2024) for Med Management 30. She needs follow-up on ADHD medication effectiveness and results from pelvic ultrasound and lab tests. - Follow up in about a month to assess the effectiveness of Adderall. - Schedule follow-up after pelvic ultrasound and lab results are available. NEXT PREVENTATIVE PHYSICAL DUE IN 1 YEAR.  PATIENT COUNSELING PROVIDED FOR ALL ADULT PATIENTS: A well balanced diet low in saturated fats, cholesterol, and moderation in carbohydrates.  This can be as simple as monitoring portion sizes and cutting back on sugary beverages such as soda and juice to start with.    Daily water consumption of at least 64 ounces.  Physical activity at least 180 minutes per week.  If just starting out, start 10 minutes a  day and work your way up.   This can be as simple as taking the stairs instead of the elevator and walking 2-3 laps around the office  purposefully  every day.   STD protection, partner selection, and regular testing if high risk.  Limited consumption of alcoholic beverages if alcohol is consumed. For men, I recommend no more than 14 alcoholic beverages per week, spread out throughout the week (max 2 per day). Avoid binge drinking or consuming large quantities of alcohol in one setting.  Please let me know if you feel you may need help with reduction or quitting alcohol consumption.   Avoidance of nicotine, if used. Please let me know if you feel you may need help with reduction or quitting nicotine use.   Daily mental health attention. This can be in the form of 5 minute daily meditation, prayer, journaling, yoga, reflection, etc.  Purposeful attention to your emotions and mental state can significantly improve your overall wellbeing  and  Health.  Please know that I am here to help you with all of your health care goals and am happy to work with you to find a solution that works best for you.  The greatest advice I have received with any changes in life are to take it one step at a time, that even means if all you can focus on is the next 60 seconds, then do that and celebrate your victories.  With any changes in life, you will have set backs, and that is OK. The important thing to remember is, if you have a set back, it is not a failure, it is an opportunity to try again! Screening Testing Mammogram Every 1 -2 years based on history and risk factors Starting at age 29 Pap Smear Ages 21-39 every 3 years Ages 56-65 every 5 years with HPV testing More frequent testing may be required based on results and history Colon Cancer Screening Every 1-10 years based on test performed, risk factors, and history Starting at age 41 Bone Density Screening Every 2-10 years based on history Starting at age 47 for women Recommendations for men differ based on medication usage, history, and risk factors AAA Screening One time ultrasound Men  73-58 years old who have every smoked Lung Cancer Screening Low Dose Lung CT every 12 months Age 68-80 years with a 30 pack-year smoking history who still smoke or who have quit within the last 15 years   Screening Labs Routine  Labs: Complete Blood Count (CBC), Complete Metabolic Panel (CMP), Cholesterol (Lipid Panel) Every 6-12 months based on history and medications May be recommended more frequently based on current conditions or previous results Hemoglobin A1c Lab Every 3-12 months based on history and previous results Starting at age 74 or earlier with diagnosis of diabetes, high cholesterol, BMI >26, and/or risk factors Frequent monitoring for patients with diabetes to ensure blood sugar control Thyroid  Panel (TSH) Every 6 months based on history, symptoms, and risk factors May be repeated more often if on medication HIV One time testing for all patients 92 and older May be repeated more frequently for patients with increased risk factors or exposure Hepatitis C One time testing for all patients 23 and older May be repeated more frequently for patients with increased risk factors or exposure Gonorrhea, Chlamydia Every 12 months for all sexually active persons 13-24 years Additional monitoring may be recommended for those who are considered high risk or who have symptoms Every 12 months for any woman on birth  control, regardless of sexual activity PSA Men 47-79 years old with risk factors Additional screening may be recommended from age 54-69 based on risk factors, symptoms, and history  Vaccine Recommendations Tetanus Booster All adults every 10 years Flu Vaccine All patients 6 months and older every year COVID Vaccine All patients 12 years and older Initial dosing with booster May recommend additional booster based on age and health history HPV Vaccine 2 doses all patients age 30-26 Dosing may be considered for patients over 26 Shingles Vaccine (Shingrix) 2 doses  all adults 55 years and older Pneumonia (Pneumovax 47) All adults 65 years and older May recommend earlier dosing based on health history One year apart from Prevnar 13 Pneumonia (Prevnar 79) All adults 65 years and older Dosed 1 year after Pneumovax 23 Pneumonia (Prevnar 20) One time alternative to the two dosing of 13 and 23 For all adults with initial dose of 23, 20 is recommended 1 year later For all adults with initial dose of 13, 23 is still recommended as second option 1 year later

## 2023-11-21 LAB — ESTRADIOL: Estradiol: 106 pg/mL

## 2023-11-21 LAB — T4, FREE: Free T4: 1.2 ng/dL (ref 0.82–1.77)

## 2023-11-21 LAB — FSH/LH
FSH: 2.9 m[IU]/mL
LH: 7.6 m[IU]/mL

## 2023-11-21 LAB — TSH: TSH: 1.82 u[IU]/mL (ref 0.450–4.500)

## 2023-11-22 ENCOUNTER — Ambulatory Visit: Payer: Self-pay | Admitting: Nurse Practitioner

## 2023-11-22 ENCOUNTER — Other Ambulatory Visit: Payer: Self-pay | Admitting: Nurse Practitioner

## 2023-11-22 DIAGNOSIS — R102 Pelvic and perineal pain: Secondary | ICD-10-CM

## 2023-11-22 MED ORDER — WEGOVY 0.25 MG/0.5ML ~~LOC~~ SOAJ: 0.2500 mg | SUBCUTANEOUS | Status: DC

## 2023-11-23 ENCOUNTER — Telehealth: Payer: Self-pay

## 2023-11-23 ENCOUNTER — Other Ambulatory Visit (HOSPITAL_COMMUNITY): Payer: Self-pay

## 2023-11-23 NOTE — Telephone Encounter (Signed)
 Pharmacy Patient Advocate Encounter  Received notification from [facility] that Prior Authorization for Amphetamine-Dextroamphetamine 10MG  tablets has been APPROVED from 5.13.25 to 6.12.26. Ran test claim, Copay is $15.00. This test claim was processed through [facility]- copay amounts may vary at other pharmacies due to pharmacy/plan contracts, or as the patient moves through the different stages of their insurance plan.   PA #/Case ID/Reference #: Key: Gloria Lyons

## 2023-11-24 ENCOUNTER — Other Ambulatory Visit (HOSPITAL_COMMUNITY): Payer: Self-pay

## 2023-11-25 LAB — COMPREHENSIVE METABOLIC PANEL WITH GFR
ALT: 10 IU/L (ref 0–32)
AST: 12 IU/L (ref 0–40)
Albumin: 4.5 g/dL (ref 3.9–4.9)
Alkaline Phosphatase: 49 IU/L (ref 44–121)
BUN/Creatinine Ratio: 16 (ref 9–23)
BUN: 13 mg/dL (ref 6–20)
Bilirubin Total: 0.4 mg/dL (ref 0.0–1.2)
CO2: 20 mmol/L (ref 20–29)
Calcium: 9.4 mg/dL (ref 8.7–10.2)
Chloride: 104 mmol/L (ref 96–106)
Creatinine, Ser: 0.82 mg/dL (ref 0.57–1.00)
Globulin, Total: 2.7 g/dL (ref 1.5–4.5)
Glucose: 87 mg/dL (ref 70–99)
Potassium: 4.6 mmol/L (ref 3.5–5.2)
Sodium: 138 mmol/L (ref 134–144)
Total Protein: 7.2 g/dL (ref 6.0–8.5)
eGFR: 97 mL/min/{1.73_m2} (ref >59)

## 2023-11-25 LAB — CBC WITH DIFFERENTIAL/PLATELET
Basophils Absolute: 0 10*3/uL (ref 0.0–0.2)
Basos: 1 %
EOS (ABSOLUTE): 0.1 10*3/uL (ref 0.0–0.4)
Eos: 2 %
Hematocrit: 41.6 % (ref 34.0–46.6)
Hemoglobin: 13.7 g/dL (ref 11.1–15.9)
Immature Grans (Abs): 0 10*3/uL (ref 0.0–0.1)
Immature Granulocytes: 0 %
Lymphocytes Absolute: 1.4 10*3/uL (ref 0.7–3.1)
Lymphs: 22 %
MCH: 29 pg (ref 26.6–33.0)
MCHC: 32.9 g/dL (ref 31.5–35.7)
MCV: 88 fL (ref 79–97)
Monocytes Absolute: 0.4 10*3/uL (ref 0.1–0.9)
Monocytes: 7 %
Neutrophils Absolute: 4.3 10*3/uL (ref 1.4–7.0)
Neutrophils: 67 %
Platelets: 240 10*3/uL (ref 150–450)
RBC: 4.72 x10E6/uL (ref 3.77–5.28)
RDW: 12.4 % (ref 11.7–15.4)
WBC: 6.3 10*3/uL (ref 3.4–10.8)

## 2023-11-25 LAB — LIPID PANEL
Chol/HDL Ratio: 2.4 ratio (ref 0.0–4.4)
Cholesterol, Total: 158 mg/dL (ref 100–199)
HDL: 65 mg/dL (ref >39)
LDL Chol Calc (NIH): 78 mg/dL (ref 0–99)
Triglycerides: 76 mg/dL (ref 0–149)
VLDL Cholesterol Cal: 15 mg/dL (ref 5–40)

## 2023-11-25 LAB — VITAMIN D 25 HYDROXY (VIT D DEFICIENCY, FRACTURES): Vit D, 25-Hydroxy: 57.4 ng/mL (ref 30.0–100.0)

## 2023-11-25 LAB — VITAMIN B12: Vitamin B-12: 384 pg/mL (ref 232–1245)

## 2023-11-25 LAB — TESTOSTERONE, TOTAL, LC/MS/MS: Testosterone, total: 17.2 ng/dL (ref 10.0–55.0)

## 2023-11-28 ENCOUNTER — Ambulatory Visit (HOSPITAL_COMMUNITY)
Admission: RE | Admit: 2023-11-28 | Discharge: 2023-11-28 | Disposition: A | Discharge status: Home / Self Care | Source: Ambulatory Visit | Attending: Nurse Practitioner | Admitting: Nurse Practitioner

## 2023-11-28 DIAGNOSIS — N921 Excessive and frequent menstruation with irregular cycle: Secondary | ICD-10-CM | POA: Diagnosis not present

## 2023-11-28 DIAGNOSIS — N926 Irregular menstruation, unspecified: Secondary | ICD-10-CM | POA: Insufficient documentation

## 2023-11-28 DIAGNOSIS — N838 Other noninflammatory disorders of ovary, fallopian tube and broad ligament: Secondary | ICD-10-CM | POA: Diagnosis not present

## 2023-11-28 DIAGNOSIS — N83202 Unspecified ovarian cyst, left side: Secondary | ICD-10-CM | POA: Diagnosis not present

## 2023-11-29 DIAGNOSIS — N926 Irregular menstruation, unspecified: Secondary | ICD-10-CM | POA: Insufficient documentation

## 2023-11-29 DIAGNOSIS — B351 Tinea unguium: Secondary | ICD-10-CM | POA: Insufficient documentation

## 2023-11-29 NOTE — Assessment & Plan Note (Signed)
 Suspected fungal infection of the toenail, likely acquired from a nail salon. Clotrimazole use has been ineffective. Discussed slow nature of toenail fungal treatment and need for prescription-strength medication. - Prescribe Penlac  (ciclopirox ) topical solution for toenail fungus. - Instruct on application: paint one coat daily, remove with alcohol swab after seven days, and continue until clear growth is observed.

## 2023-11-29 NOTE — Assessment & Plan Note (Signed)
 Labs have been normal historically. Not currently on medication. Will continue to monitor.

## 2023-11-29 NOTE — Assessment & Plan Note (Signed)
 She experiences significant difficulty with focus and organization, particularly in an office setting. ADHD symptoms have been present since childhood but were not formally diagnosed until adulthood. Previous trial of Adderall 5 mg IR was ineffective. Discussed benefits of Adderall, including short-acting and long-acting forms, and the importance of finding the right dosage and formulation. Short-acting Adderall may vary in duration from 2 to 8 hours depending on individual response. - Prescribe Adderall 10 mg with instructions to take up to twice a day, allowing flexibility in dosing. - Follow up in about a month to assess effectiveness and adjust dosage if necessary.

## 2023-11-29 NOTE — Assessment & Plan Note (Signed)
 Repeat labs today. Currently managing with topical compounded cream on thigh twice a week.

## 2023-11-29 NOTE — Assessment & Plan Note (Signed)
 She is due for a B12 shot as part of routine health maintenance. - Administer B12 shot

## 2023-11-29 NOTE — Assessment & Plan Note (Signed)

## 2023-11-29 NOTE — Assessment & Plan Note (Signed)
 Menstrual cycles are becoming closer together, with increased pain during urination in certain positions, and heavy bleeding for two days followed by lighter flow. Suspected uterine cramping related to bladder movement, but further evaluation is necessary to rule out fibroids or abnormal uterine lining. Discussed possibility of thyroid  issues contributing to symptoms. - Order pelvic ultrasound to evaluate for fibroids or abnormal uterine lining. - Check thyroid  function and other relevant labs to rule out endocrine causes.

## 2023-11-29 NOTE — Assessment & Plan Note (Signed)
 Resolved with normal values at this time. No changes.

## 2023-11-29 NOTE — Assessment & Plan Note (Signed)
 Successful weight loss of over 100lbs with Wegovy  in the past. She faces challenges with weight management after discontinuing Wegovy  due to cost, experiencing increased hunger and cravings. Previous trials of phentermine caused anxiety, and orlistat was ineffective. Orlistat may not be well tolerated due to common intolerance among patients. - Prescribe orlistat 120 mg for trial. - Document previous trials of phentermine and orlistat for insurance purposes. - Consider tier reduction for Wegovy  if orlistat is ineffective.

## 2023-12-07 ENCOUNTER — Other Ambulatory Visit (HOSPITAL_COMMUNITY): Payer: Self-pay

## 2023-12-07 ENCOUNTER — Encounter: Payer: Self-pay | Admitting: Nurse Practitioner

## 2023-12-11 ENCOUNTER — Other Ambulatory Visit: Payer: Self-pay | Admitting: Nurse Practitioner

## 2023-12-11 DIAGNOSIS — Z6841 Body Mass Index (BMI) 40.0 and over, adult: Secondary | ICD-10-CM

## 2023-12-11 MED ORDER — ORLISTAT 120 MG PO CAPS: 120.0000 mg | ORAL_CAPSULE | Freq: Three times a day (TID) | ORAL | 1 refills | Status: DC

## 2023-12-13 ENCOUNTER — Encounter: Payer: Self-pay | Admitting: Radiology

## 2023-12-13 ENCOUNTER — Other Ambulatory Visit (HOSPITAL_COMMUNITY)
Admission: RE | Admit: 2023-12-13 | Discharge: 2023-12-13 | Disposition: A | Discharge status: Home / Self Care | Source: Ambulatory Visit | Attending: Radiology | Admitting: Radiology

## 2023-12-13 ENCOUNTER — Ambulatory Visit: Admitting: Radiology

## 2023-12-13 VITALS — BP 108/76 | HR 93 | Temp 99.2°F | Ht 65.25 in | Wt 210.0 lb

## 2023-12-13 DIAGNOSIS — Z803 Family history of malignant neoplasm of breast: Secondary | ICD-10-CM

## 2023-12-13 DIAGNOSIS — Z01419 Encounter for gynecological examination (general) (routine) without abnormal findings: Secondary | ICD-10-CM

## 2023-12-13 DIAGNOSIS — N83202 Unspecified ovarian cyst, left side: Secondary | ICD-10-CM | POA: Diagnosis not present

## 2023-12-13 DIAGNOSIS — Z1331 Encounter for screening for depression: Secondary | ICD-10-CM

## 2023-12-13 MED ORDER — JUNEL FE 24 1-20 MG-MCG(24) PO TABS: 1.0000 | ORAL_TABLET | Freq: Every day | ORAL | 4 refills | Status: AC

## 2023-12-13 NOTE — Patient Instructions (Signed)

## 2023-12-13 NOTE — Progress Notes (Signed)
 Gloria Lyons 1990/12/21 [id number]   History:  33 y.o. G2P2 referred from PCP after u/s for dysmenorrhea and menorrhagia showed possible pelvic congestion syndrome and a left ovarian cyst. Family history of breast cancer and melanoma on her maternal side, interested in genetic testing.  Gynecologic History Patient's last menstrual period was 11/26/2023 (exact date). Period Cycle (Days): 25 Period Duration (Days): 5 Period Pattern: (!) Irregular (periods closer together) Menstrual Flow: Heavy (very heavy first 2 days) Menstrual Control: Thin pad, Maxi pad, Tampon Dysmenorrhea: (!) Moderate (moderate to severe) Dysmenorrhea Symptoms: Cramping, Headache, Diarrhea Contraception/Family planning: tubal ligation Sexually active: yes Last Pap: 2018  Obstetric History OB History  Gravida Para Term Preterm AB Living  2 2 2   2   SAB IAB Ectopic Multiple Live Births     0 2    # Outcome Date GA Lbr Len/2nd Weight Sex Type Anes PTL Lv  2 Term 02/08/19 [redacted]w[redacted]d 03:01 / 00:05 6 lb 8.9 oz (2.975 kg) M Vag-Spont EPI  LIV     Birth Comments: wnl  1 Term 08/25/17 [redacted]w[redacted]d 03:16 / 00:31 8 lb 12.2 oz (3.975 kg) F Vag-Spont EPI  LIV       12/13/2023    2:29 PM 11/20/2023   10:18 AM 09/27/2021    1:34 PM 07/01/2021    7:50 PM 08/27/2018    3:24 PM  Depression screen PHQ 2/9  Decreased Interest 0 0 1 0 0  Down, Depressed, Hopeless 0 0 1 0 0  PHQ - 2 Score 0 0 2 0 0  Altered sleeping   0  1  Tired, decreased energy   1  1  Change in appetite   0  0  Feeling bad or failure about yourself    1  0  Trouble concentrating   0  0  Moving slowly or fidgety/restless   0  0  Suicidal thoughts   0  0  PHQ-9 Score   4  2  Difficult doing work/chores   Not difficult at all       The following portions of the patient's history were reviewed and updated as appropriate: allergies, current medications, past family history, past medical history, past social history, past surgical history, and problem  list.  Review of Systems  All other systems reviewed and are negative.   Past medical history, past surgical history, family history and social history were all reviewed and documented in the EPIC chart.  Exam:  Vitals:   12/13/23 1355  BP: 108/76  Pulse: 93  Temp: 99.2 F (37.3 C)  TempSrc: Oral  SpO2: 98%  Weight: 210 lb (95.3 kg)  Height: 5' 5.25 (1.657 m)   Body mass index is 34.68 kg/m.  Physical Exam Vitals and nursing note reviewed. Exam conducted with a chaperone present.  Constitutional:      Appearance: Normal appearance. She is normal weight.  HENT:     Head: Normocephalic and atraumatic.  Neck:     Thyroid : No thyroid  mass, thyromegaly or thyroid  tenderness.  Cardiovascular:     Rate and Rhythm: Regular rhythm.     Heart sounds: Normal heart sounds.  Pulmonary:     Effort: Pulmonary effort is normal.     Breath sounds: Normal breath sounds.  Chest:  Breasts:    Breasts are symmetrical.     Right: Normal. No inverted nipple, mass, nipple discharge, skin change or tenderness.     Left: Normal. No inverted nipple, mass, nipple discharge, skin  change or tenderness.  Abdominal:     General: Abdomen is flat. Bowel sounds are normal.     Palpations: Abdomen is soft.  Genitourinary:    General: Normal vulva.     Vagina: Normal. No vaginal discharge, bleeding or lesions.     Cervix: Normal. No discharge or lesion.     Uterus: Normal. Not enlarged and not tender.      Adnexa: Right adnexa normal and left adnexa normal.       Right: No mass, tenderness or fullness.         Left: No mass, tenderness or fullness.    Lymphadenopathy:     Upper Body:     Right upper body: No axillary adenopathy.     Left upper body: No axillary adenopathy.  Skin:    General: Skin is warm and dry.  Neurological:     Mental Status: She is alert and oriented to person, place, and time.  Psychiatric:        Mood and Affect: Mood normal.        Thought Content: Thought content  normal.        Judgment: Judgment normal.      Darice Hoit, CMA present for exam  Assessment/Plan:   1. Well woman exam with routine gynecological exam (Primary) - Cytology - PAP( [place])  2. Left ovarian cyst Start OCP to help with symptoms, if no improvement will refer to IRadiology - US  PELVIS TRANSVAGINAL NON-OB (TV ONLY); Future - Norethindrone Acetate-Ethinyl Estrad-FE (JUNEL FE 24) 1-20 MG-MCG(24) tablet; Take 1 tablet by mouth daily.  Dispense: 84 tablet; Refill: 4  3. Depression screening negative  4. Family history of breast cancer - Ambulatory referral to Genetics   Follow up in 10 weeks for u/s   Sharni Negron B WHNP-BC 2:04 PM 12/13/2023

## 2023-12-19 ENCOUNTER — Telehealth: Payer: Self-pay

## 2023-12-19 ENCOUNTER — Ambulatory Visit: Payer: Self-pay | Admitting: Radiology

## 2023-12-19 ENCOUNTER — Other Ambulatory Visit: Payer: Self-pay | Admitting: Nurse Practitioner

## 2023-12-19 ENCOUNTER — Other Ambulatory Visit (HOSPITAL_COMMUNITY): Payer: Self-pay

## 2023-12-19 DIAGNOSIS — Z6841 Body Mass Index (BMI) 40.0 and over, adult: Secondary | ICD-10-CM

## 2023-12-19 LAB — CYTOLOGY - PAP
Comment: NEGATIVE
Diagnosis: NEGATIVE
Diagnosis: REACTIVE
High risk HPV: NEGATIVE

## 2023-12-19 MED ORDER — SAXENDA 18 MG/3ML ~~LOC~~ SOPN: 0.6000 mg | PEN_INJECTOR | Freq: Every day | SUBCUTANEOUS | 0 refills | Status: DC

## 2023-12-19 NOTE — Telephone Encounter (Signed)
 Pharmacy Patient Advocate Encounter  Received notification from [facility] that Prior Authorization for Saxenda  18MG /3ML pen-injectors has been APPROVED from 6.8.25 to 1.4.26. Ran test claim, Copay is $154.10. This test claim was processed through [facility]- copay amounts may vary at other pharmacies due to pharmacy/plan contracts, or as the patient moves through the different stages of their insurance plan.   PA #/Case ID/Reference #: (Key: [id number])

## 2023-12-20 ENCOUNTER — Other Ambulatory Visit

## 2023-12-20 DIAGNOSIS — E538 Deficiency of other specified B group vitamins: Secondary | ICD-10-CM | POA: Diagnosis not present

## 2023-12-20 MED ORDER — CYANOCOBALAMIN 1000 MCG/ML IJ SOLN
1000.0000 ug | Freq: Once | INTRAMUSCULAR | Status: AC
  Administered 2023-12-20: 1000 ug via INTRAMUSCULAR

## 2023-12-20 NOTE — Addendum Note (Signed)
 Addended by: JOHNNYE JON SQUIBB on: 12/20/2023 11:36 AM   Modules accepted: Orders

## 2023-12-26 ENCOUNTER — Encounter: Payer: Self-pay | Admitting: Nurse Practitioner

## 2023-12-26 DIAGNOSIS — F902 Attention-deficit hyperactivity disorder, combined type: Secondary | ICD-10-CM

## 2023-12-27 MED ORDER — AMPHETAMINE-DEXTROAMPHETAMINE 10 MG PO TABS: 10.0000 mg | ORAL_TABLET | Freq: Two times a day (BID) | ORAL | 0 refills | Status: DC

## 2024-01-01 ENCOUNTER — Encounter: Payer: Self-pay | Admitting: Nurse Practitioner

## 2024-01-02 ENCOUNTER — Other Ambulatory Visit: Payer: Self-pay | Admitting: Nurse Practitioner

## 2024-01-02 DIAGNOSIS — Z6841 Body Mass Index (BMI) 40.0 and over, adult: Secondary | ICD-10-CM

## 2024-01-02 MED ORDER — SAXENDA 18 MG/3ML ~~LOC~~ SOPN: PEN_INJECTOR | SUBCUTANEOUS | 11 refills | Status: DC

## 2024-01-03 MED ORDER — AMPHETAMINE-DEXTROAMPHET ER 15 MG PO CP24: 15.0000 mg | ORAL_CAPSULE | ORAL | 0 refills | Status: DC

## 2024-01-03 NOTE — Addendum Note (Signed)
 Addended by: Zyad Boomer, CAMIE E on: 01/03/2024 09:33 AM   Modules accepted: Orders

## 2024-01-12 ENCOUNTER — Encounter: Payer: Self-pay | Admitting: Nurse Practitioner

## 2024-01-17 ENCOUNTER — Other Ambulatory Visit

## 2024-01-24 ENCOUNTER — Other Ambulatory Visit (INDEPENDENT_AMBULATORY_CARE_PROVIDER_SITE_OTHER)

## 2024-01-24 DIAGNOSIS — E538 Deficiency of other specified B group vitamins: Secondary | ICD-10-CM | POA: Diagnosis not present

## 2024-01-24 MED ORDER — CYANOCOBALAMIN 1000 MCG/ML IJ SOLN
1000.0000 ug | Freq: Once | INTRAMUSCULAR | Status: AC
  Administered 2024-01-24 (×2): 1000 ug via INTRAMUSCULAR

## 2024-02-09 ENCOUNTER — Encounter: Payer: Self-pay | Admitting: Nurse Practitioner

## 2024-02-09 ENCOUNTER — Other Ambulatory Visit: Payer: Self-pay | Admitting: Nurse Practitioner

## 2024-02-09 DIAGNOSIS — F902 Attention-deficit hyperactivity disorder, combined type: Secondary | ICD-10-CM

## 2024-02-09 MED ORDER — AMPHETAMINE-DEXTROAMPHET ER 15 MG PO CP24: 15.0000 mg | ORAL_CAPSULE | ORAL | 0 refills | Status: DC

## 2024-02-09 MED ORDER — AMPHETAMINE-DEXTROAMPHET ER 15 MG PO CP24
15.0000 mg | ORAL_CAPSULE | ORAL | 0 refills | Status: DC
Start: 2024-04-05 — End: 2024-03-27

## 2024-02-14 ENCOUNTER — Other Ambulatory Visit: Payer: Self-pay | Admitting: Nurse Practitioner

## 2024-02-14 DIAGNOSIS — E559 Vitamin D deficiency, unspecified: Secondary | ICD-10-CM

## 2024-02-16 ENCOUNTER — Other Ambulatory Visit (HOSPITAL_COMMUNITY): Payer: Self-pay

## 2024-02-16 ENCOUNTER — Telehealth: Payer: Self-pay

## 2024-02-16 DIAGNOSIS — Z6841 Body Mass Index (BMI) 40.0 and over, adult: Secondary | ICD-10-CM

## 2024-02-16 NOTE — Telephone Encounter (Addendum)
 Please note that a Tier Exception form was submitted via Fax on 9/5. Currently awaiting a determination

## 2024-02-16 NOTE — Telephone Encounter (Signed)
-  Received documentation via fax that a Prior Autorization would need to be done For Rx Wegovy  before A Tier reduction could be done. P/a is Now currently Pending   Pharmacy Patient Advocate Encounter   Received notification from Pt Calls Messages & fax that prior authorization for WEGOVY  0.25MG   is required/requested.   Insurance verification completed.   The patient is insured through [employer] .   Per test claim: PA required; PA submitted to above mentioned insurance via Latent Key/confirmation #/EOC [id number]   Status is pending

## 2024-02-16 NOTE — Telephone Encounter (Addendum)
 See pt msg from 8/29

## 2024-02-19 ENCOUNTER — Other Ambulatory Visit (HOSPITAL_COMMUNITY): Payer: Self-pay

## 2024-02-19 NOTE — Telephone Encounter (Signed)
 Please note that the ( Prior Authorization) has been Approved. However the Tier Reduction is still Pending.   Pharmacy Patient Advocate Encounter  Received notification from [facility] that Prior Authorization for WEGOVY  0.25MG   has been APPROVED  to 3.4.26 .Ran test claim, Copay is $770.29. This test claim was processed through [facility]- copay amounts may vary at other pharmacies due to pharmacy/plan contracts, or as the patient moves through the different stages of their insurance plan.   PA #/Case ID/Reference #: [id number]

## 2024-02-21 ENCOUNTER — Other Ambulatory Visit (HOSPITAL_COMMUNITY): Payer: Self-pay

## 2024-02-21 ENCOUNTER — Other Ambulatory Visit (INDEPENDENT_AMBULATORY_CARE_PROVIDER_SITE_OTHER)

## 2024-02-21 ENCOUNTER — Ambulatory Visit (INDEPENDENT_AMBULATORY_CARE_PROVIDER_SITE_OTHER)

## 2024-02-21 ENCOUNTER — Ambulatory Visit (INDEPENDENT_AMBULATORY_CARE_PROVIDER_SITE_OTHER): Admitting: Radiology

## 2024-02-21 ENCOUNTER — Encounter: Payer: Self-pay | Admitting: Radiology

## 2024-02-21 VITALS — BP 110/78 | HR 87 | Wt 219.0 lb

## 2024-02-21 DIAGNOSIS — E538 Deficiency of other specified B group vitamins: Secondary | ICD-10-CM | POA: Diagnosis not present

## 2024-02-21 DIAGNOSIS — N83202 Unspecified ovarian cyst, left side: Secondary | ICD-10-CM

## 2024-02-21 MED ORDER — WEGOVY 0.25 MG/0.5ML ~~LOC~~ SOAJ: 0.2500 mg | SUBCUTANEOUS | 0 refills | Status: DC

## 2024-02-21 MED ORDER — CYANOCOBALAMIN 1000 MCG/ML IJ SOLN
1000.0000 ug | Freq: Once | INTRAMUSCULAR | Status: AC
  Administered 2024-02-21: 1000 ug via INTRAMUSCULAR

## 2024-02-21 NOTE — Telephone Encounter (Signed)
             The Tier Exception has been Approved and effective from 9.9.25 to 3.4.26. cost is 24.99. pt can now get this Rx filled. She would just need an active script on file to be sent into her pref'd pharmacy.

## 2024-02-21 NOTE — Addendum Note (Signed)
 Addended by: Aashka Salomone, CAMIE E on: 02/21/2024 10:26 AM   Modules accepted: Orders

## 2024-02-21 NOTE — Telephone Encounter (Signed)
 Thank you so much [place]!

## 2024-02-21 NOTE — Progress Notes (Signed)
   Gloria Lyons Oct 20, 1990 [id number]   History:  33 y.o. G2P2 follow up for left ovarian cyst. Hx of menorrhagia, u/s 6/25 showed possible pelvic congestion syndrome and a left ovarian cyst. Doing well on OCPs, periods are significantly shorter and lighter.  Gynecologic History Patient's last menstrual period was 02/06/2024 (exact date).   Contraception/Family planning: tubal ligation Sexually active: yes Last Pap: 2018  Obstetric History OB History  Gravida Para Term Preterm AB Living  2 2 2   2   SAB IAB Ectopic Multiple Live Births     0 2    # Outcome Date GA Lbr Len/2nd Weight Sex Type Anes PTL Lv  2 Term 02/08/19 [redacted]w[redacted]d 03:01 / 00:05 6 lb 8.9 oz (2.975 kg) M Vag-Spont EPI  LIV     Birth Comments: wnl  1 Term 08/25/17 [redacted]w[redacted]d 03:16 / 00:31 8 lb 12.2 oz (3.975 kg) F Vag-Spont EPI  LIV      The following portions of the patient's history were reviewed and updated as appropriate: allergies, current medications, past family history, past medical history, past social history, past surgical history, and problem list.  Review of Systems  All other systems reviewed and are negative.   Past medical history, past surgical history, family history and social history were all reviewed and documented in the EPIC chart.  Exam:  Vitals:   02/21/24 1102  BP: 110/78  Pulse: 87  SpO2: 98%  Weight: 219 lb (99.3 kg)   Body mass index is 36.16 kg/m.  Narrative & Impression Indication: f/u left ovarian cyst   Vaginal ultrasound   Retroverted size and shape 6.32 x 5.18 x 4.35 cm No myometrial masses     Thin symmetrical endometrium 4.20 mm No masses or thickening seen   Both ovaries normal size with normal follicle pattern and perfusion   No adnexal masses No free fluid   Dilated vessels noted bilaterally   Impression:pelvic congestion syndrome, otherwise normal, cyst resolved       Exam Ended: 02/21/24 10:58 Last Resulted: 02/21/24 11:04    Assessment/Plan:   1.  Left ovarian cyst (Primary) Resolved. Patient reassured Has refills on OCPs, will continue.   Gloria Lyons B WHNP-BC 11:04 AM 02/21/2024

## 2024-02-21 NOTE — Telephone Encounter (Signed)
 Gloria Lyons,           The Tier Exception has been Approved and effective from 9.9.25 to 3.4.26. cost is 24.99. pt can now get this Rx filled. She would just need an active script on file to be sent into her pref'd pharmacy.

## 2024-03-16 ENCOUNTER — Encounter: Payer: Self-pay | Admitting: Nurse Practitioner

## 2024-03-18 ENCOUNTER — Other Ambulatory Visit: Payer: Self-pay

## 2024-03-18 MED ORDER — WEGOVY 0.5 MG/0.5ML ~~LOC~~ SOAJ: 0.5000 mg | SUBCUTANEOUS | 0 refills | Status: AC

## 2024-03-19 ENCOUNTER — Telehealth: Payer: Self-pay | Admitting: Internal Medicine

## 2024-03-27 ENCOUNTER — Ambulatory Visit: Admitting: Nurse Practitioner

## 2024-03-27 ENCOUNTER — Encounter: Payer: Self-pay | Admitting: Nurse Practitioner

## 2024-03-27 ENCOUNTER — Encounter: Payer: Self-pay | Admitting: Genetic Counselor

## 2024-03-27 ENCOUNTER — Inpatient Hospital Stay: Attending: Hematology and Oncology | Admitting: Genetic Counselor

## 2024-03-27 ENCOUNTER — Inpatient Hospital Stay

## 2024-03-27 VITALS — BP 122/78 | HR 96 | Wt 216.6 lb

## 2024-03-27 DIAGNOSIS — E349 Endocrine disorder, unspecified: Secondary | ICD-10-CM

## 2024-03-27 DIAGNOSIS — Z1379 Encounter for other screening for genetic and chromosomal anomalies: Secondary | ICD-10-CM | POA: Insufficient documentation

## 2024-03-27 DIAGNOSIS — Z803 Family history of malignant neoplasm of breast: Secondary | ICD-10-CM | POA: Diagnosis not present

## 2024-03-27 DIAGNOSIS — Z806 Family history of leukemia: Secondary | ICD-10-CM | POA: Insufficient documentation

## 2024-03-27 DIAGNOSIS — F902 Attention-deficit hyperactivity disorder, combined type: Secondary | ICD-10-CM

## 2024-03-27 DIAGNOSIS — R110 Nausea: Secondary | ICD-10-CM

## 2024-03-27 DIAGNOSIS — Z6841 Body Mass Index (BMI) 40.0 and over, adult: Secondary | ICD-10-CM | POA: Diagnosis not present

## 2024-03-27 DIAGNOSIS — Z23 Encounter for immunization: Secondary | ICD-10-CM

## 2024-03-27 DIAGNOSIS — E538 Deficiency of other specified B group vitamins: Secondary | ICD-10-CM

## 2024-03-27 DIAGNOSIS — Z808 Family history of malignant neoplasm of other organs or systems: Secondary | ICD-10-CM | POA: Diagnosis not present

## 2024-03-27 LAB — GENETIC SCREENING ORDER

## 2024-03-27 MED ORDER — AMPHETAMINE-DEXTROAMPHETAMINE 20 MG PO TABS: 20.0000 mg | ORAL_TABLET | Freq: Two times a day (BID) | ORAL | 0 refills | Status: AC

## 2024-03-27 MED ORDER — WEGOVY 1 MG/0.5ML ~~LOC~~ SOAJ: 1.0000 mg | SUBCUTANEOUS | 0 refills | Status: AC

## 2024-03-27 MED ORDER — CYANOCOBALAMIN 1000 MCG/ML IJ SOLN
1000.0000 ug | Freq: Once | INTRAMUSCULAR | Status: AC
  Administered 2024-03-27: 1000 ug via INTRAMUSCULAR

## 2024-03-27 MED ORDER — ONDANSETRON 4 MG PO TBDP: 4.0000 mg | ORAL_TABLET | Freq: Three times a day (TID) | ORAL | 3 refills | Status: AC | PRN

## 2024-03-27 NOTE — Assessment & Plan Note (Signed)
 Topical compounded cream effective for symptom management. She has shown improvement in testosterone  levels to therapeutic level. Will plan to continue and monitor labs annually.

## 2024-03-27 NOTE — Assessment & Plan Note (Signed)
 Labs checked at last visit and stable. Injection provided today.

## 2024-03-27 NOTE — Progress Notes (Signed)
 REFERRING PROVIDER: Ginette Shasta NOVAK, NP [address] [place],  [place] [address]  PRIMARY PROVIDER:  Oris Camie BRAVO, NP  PRIMARY REASON FOR VISIT:  1. Family history of malignant neoplasm of breast   2. Family history of ALL (acute lymphoid leukemia)   3. Family history of melanoma    HISTORY OF PRESENT ILLNESS:   Ms. Kops, a 33 y.o. female, was seen for a [facility] cancer genetics consultation at the request of Chrzanowski, Jami B, NP due to a family history of cancer.  Ms. Fortune presents to clinic today to discuss the possibility of a hereditary predisposition to cancer, to discuss genetic testing, and to further clarify her future cancer risks, as well as potential cancer risks for family members.   Ms. Vezina is a 33 y.o. female with no personal history of cancer.    RELEVANT MEDICAL HISTORY:  Menarche was at age 69.  First live birth at age 24.  OCP use for approximately 2 years.  Ovaries intact: yes. Hx tubal ligation Uterus intact: yes.  Menopausal status: premenopausal.  HRT use: 0 years. Colonoscopy: no; not examined. Mammogram within the last year: no. Number of breast biopsies: 0.   Past Medical History:  Diagnosis Date   Chronic pain of both knees 07/01/2021   Encounter for annual physical exam 02/15/2019   Gastroesophageal reflux disease 01/22/2023   H/O preeclampsia 08/24/2017   Mild    Baseline labs: nl @ 15wks     ASA 162mg  [x]      Hypothyroidism    stated several years ago and does not take meds for it   Nausea and vomiting 12/22/2021   Thinning hair 01/23/2023   Etiology unknown. Labs pending     Weight gain 07/01/2021    Past Surgical History:  Procedure Laterality Date   NO PAST SURGERIES     TUBAL LIGATION N/A 02/08/2019   Procedure: POST PARTUM TUBAL LIGATION;  Surgeon: Cris Burnard DEL, MD;  Location: [facility];  Service: Gynecology;  Laterality: N/A;    Social History   Socioeconomic History   Marital status: Married     Spouse name: Keighley Deckman   Number of children: 2   Years of education: Not on file   Highest education level: Bachelor's degree (e.g., BA, AB, BS)  Occupational History   Not on file  Tobacco Use   Smoking status: Never    Passive exposure: Past   Smokeless tobacco: Never   Tobacco comments:    Both parents smoke. I never have but I had 25 years of second hand smoke.  Vaping Use   Vaping status: Never Used  Substance and Sexual Activity   Alcohol use: Yes    Comment: Maybe once every 2-3 weeks   Drug use: No   Sexual activity: Yes    Partners: Male    Birth control/protection: Surgical    Comment: tubal, menarche 33yo, seuxal debut 33yo  Other Topics Concern   Not on file  Social History Narrative   Not on file   Social Drivers of Health   Financial Resource Strain: Low Risk  (11/19/2023)   Overall Financial Resource Strain (CARDIA)    Difficulty of Paying Living Expenses: Not hard at all  Food Insecurity: No Food Insecurity (11/19/2023)   Hunger Vital Sign    Worried About Running Out of Food in the Last Year: Never true    Ran Out of Food in the Last Year: Never true  Transportation Needs:  No Transportation Needs (11/19/2023)   PRAPARE - Administrator, Civil Service (Medical): No    Lack of Transportation (Non-Medical): No  Physical Activity: Insufficiently Active (11/19/2023)   Exercise Vital Sign    Days of Exercise per Week: 2 days    Minutes of Exercise per Session: 10 min  Stress: No Stress Concern Present (11/19/2023)   [employer] of Occupational Health - Occupational Stress Questionnaire    Feeling of Stress : Only a little  Social Connections: Moderately Integrated (11/19/2023)   Social Connection and Isolation Panel    Frequency of Communication with Friends and Family: Twice a week    Frequency of Social Gatherings with Friends and Family: Twice a week    Attends Religious Services: More than 4 times per year    Active Member of [employer] or  Organizations: No    Attends Banker Meetings: Never    Marital Status: Married     FAMILY HISTORY:  We obtained a detailed, 4-generation family history.  Significant diagnoses are listed below: Family History  Problem Relation Age of Onset   Heart attack Mother    Seizures Mother    Hypertension Mother    Anxiety disorder Mother    Depression Mother    Leukemia Father 68 - 36       late 57s, ALL   Hypertension Father    Alcohol abuse Father    Cancer Father    COPD Father    Depression Father    Heart disease Father    Melanoma Maternal Aunt 50   Melanoma Maternal Uncle 60   Hearing loss Maternal Uncle    Hearing loss Maternal Uncle    Hypertension Paternal Aunt    Hypertension Paternal Aunt    Breast cancer Maternal Grandmother 50   Diabetes Maternal Grandmother    Diabetes Maternal Grandfather    Cancer Paternal Grandfather        type unknown   Diabetes Paternal Grandfather    Breast cancer Son 22   Hearing loss Maternal Cousin    Hearing loss Maternal Cousin     Ms. Basara reports a maternal aunt that had genetic testing for breast cancer risk that was normal, report unable to be reviewed. Otherwise she is unaware of any genetic testing in her family. She reports ancestry as [place]. There is no reported Ashkenazi Jewish ancestry.   She reports her father was diagnosed with ALL in his late 71s and lung cancer in his early 56s, passed away at age 30. Her paternal grandfather was diagnosed with an unknown cancer, passed away at age 1. She reports a maternal aunt and a maternal uncle diagnosed with several spots of melanoma each, first diagnosed in the 61s-60s. She reports her maternal grandmother was diagnosed with breast cancer at 75, had several recurrences and died of metastatic disease. Her maternal grandmother's sister was diagnosed with breast cancer at 50 and is living.   Of note, Ms. Bollig does report two of her female maternal first cousins  diagnosed with hearing loss in infancy and two maternal uncles (mother's maternal half brothers) diagnosed with hearing loss in infancy. We discussed potential genetic etiologies for hearing loss and encouraged that her relatives that have been diagnosed with hearing loss consider genetic testing. We encouraged her to notify us  if we can help identify a genetics provider for these family members. She has concerns for her son's hearing, but shared that he had previously been evaluated and hearing  at the time was within normal limits. Encourage her to continue raising concerns to her son's pediatrician.     GENETIC COUNSELING ASSESSMENT: Ms. Gentry is a 33 y.o. female with a family history of cancer which is somewhat suggestive of a hereditary predisposition to cancer given the history of a second degree maternal relative with breast cancer at 81 and two maternal second degree relatives with multiple melanoma diagnoses. We, therefore, discussed and recommended the following at today's visit.   DISCUSSION: We discussed that 5 - 10% of cancer is hereditary, with most cases of hereditary breast cancer associated with pathogenic variants in BRCA1/2.  There are other genes that can be associated with hereditary breast cancer syndromes.  We discussed that testing is beneficial for several reasons, including knowing about other cancer risks, identifying potential screening and risk-reduction options that may be appropriate, and to understanding if other family members could be at risk for cancer and allowing them to undergo genetic testing.  We reviewed the characteristics, features and inheritance patterns of hereditary cancer syndromes. We also discussed genetic testing, including the appropriate family members to test, the process of testing, insurance coverage and turn-around-time for results. We discussed the implications of a negative, positive, carrier and/or variant of uncertain significant result. We  discussed that negative results would be uninformative given that Ms. Lieder does not have a personal history of cancer. We recommended Ms. Pagaduan pursue genetic testing for a panel that contains genes associated with breast cancer, melanoma, and leukemia.  Ms. Brazell was offered a common hereditary cancer panel (40+genes) and an expanded pan-cancer panel (70+ genes), with the addition of hereditary leukemia genes. Ms. Riolo was informed of the benefits and limitations of each panel, including that expanded pan-cancer panels contain several genes that do not have clear management guidelines at this point in time.  We also discussed that as the number of genes included on a panel increases, the chances of variants of uncertain significance increases.  After considering the benefits and limitations of each gene panel, Ms. Ederer elected to have [facility] MultiCancer and Hereditary Leukemia panel. [facility] MultiCancer panel includes analysis of the following 70 genes: AIP, ALK, APC, ATM, AXIN2, BAP1, BARD1, BLM, BMPR1A, BRCA1, BRCA2, BRIP1, CDC73, CDH1, CDK4, CDKN1B, CDKN2A, CHEK2, CTNNA1, DICER1, EGFR, EPCAM, FH, FLCN, GREM1, HOXB13, KIT, LZTR1, MAX, MBD4, MEN1, MET, MITF, MLH1, MSH2, MSH6, MUTYH, NF1, NF2, NTHL1, PALB2, PDGFRA, PMS2, POLD1, POLE, POT1, PRKAR1A, PTCH1, PTEN, RAD51C, RAD51D, RB1, RET, SDHA, SDHAF2, SDHB, SDHC, SDHD, SMAD4, SMARCA4, SMARCB1, SMARCE1, STK11, SUFU, TMEM127, TP53, TSC1, TSC2, VHL. [facility] Hereditary Myelodysplasic Syndrome/Leukemia panel + prelimiary evidence genes includes analysis of the following 39 genes: ANKRD26, ATM, BLM, CBL, CEBPA, DDX41, ELANE, EPCAM, ERCC6L2, ETV6, G6PC3, GATA2, GFI1, HAX1, IKZF1, KRAS, MECOM, MLH1, MSH2, MSH6, NBN, NF1, PMS2, PTPN11, RTEL1, RUNX1, SAMD9, SAMD9L, SRP72, TERC, TERT, TP53, BRCA1, BRCA2, CHEK2, KDM1A, MBD4, PAX5, TET2  Based on Ms. Corkum's family history of cancer, she meets medical criteria for genetic testing. Though Ms. Lenderman is not  personally affected, there are no affected family members that are willing/able to undergo hereditary cancer testing. Despite that she meets criteria, she may still have an out of pocket cost. We discussed that if her out of pocket cost for testing is over $100, the laboratory should contact them to discuss self-pay prices, patient pay assistance programs, if applicable, and other billing options.  We discussed that some people do not want to undergo genetic testing due to fear of genetic discrimination.  A  federal law called the Genetic Information Non-Discrimination Act (GINA) of 2008 helps protect individuals against genetic discrimination based on their genetic test results.  It impacts both health insurance and employment.  With health insurance, it protects against increased premiums, being kicked off insurance or being forced to take a test in order to be insured.  For employment it protects against hiring, firing and promoting decisions based on genetic test results.  GINA does not apply to those in the [employer], those who work for companies with less than 15 employees, and new life insurance or long-term disability insurance policies.  Health status due to a cancer diagnosis is not protected under GINA.  PLAN: After considering the risks, benefits, and limitations, Ms. Lucarelli provided informed consent to pursue genetic testing and the blood sample was sent to [facility] for analysis of the MultiCancer and Hereditary Leukemia panels. Results should be available within approximately 2-3 weeks' time, at which point they will be disclosed by telephone to Ms. Inglis, as will any additional recommendations warranted by these results. Ms. Alcorta will receive a summary of her genetic counseling visit and a copy of her results once available. This information will also be available in Epic.   Lastly, we encouraged Ms. Trebilcock to remain in contact with cancer genetics annually so that we can  continuously update the family history and inform her of any changes in cancer genetics and testing that may be of benefit for this family.   Ms. Latif questions were answered to her satisfaction today. Our contact information was provided should additional questions or concerns arise. Thank you for the referral and allowing us  to share in the care of your patient.   Burnard Ogren, MS, [facility] Licensed, Retail [email or web address] phone: [phone]   50 minutes were spent on the date of the encounter in service to the patient including preparation, face-to-face consultation, documentation and care coordination. The patient was seen alone.  Drs. Gudena and/or Lanny were available to discuss this case as needed.  _______________________________________________________________________ For Office Staff:  Number of people involved in session: 1 Was an Intern/ student involved with case: no

## 2024-03-27 NOTE — Assessment & Plan Note (Signed)
 Continue with diet, exercise, and wegovy  for weight management. 1mg  dose sent to the pharmacy. OK to stay on 0.5mg  if she is having any symptoms of nausea when she reaches the end of the month.

## 2024-03-27 NOTE — Progress Notes (Signed)
 Catheline Doing, DNP, AGNP-c [facility]  [address] [place], [place] [address] [phone]  ESTABLISHED PATIENT- Chronic Health and/or Follow-Up Visit on 03/27/2024  Blood pressure 122/78, pulse 96, weight 216 lb 9.6 oz (98.2 kg), last menstrual period 02/06/2024.   HPI:  Gloria Lyons is here today for follow-up of chronic conditions.   ADHD At her last visit we changed her adderall from instant release to extended release and increased the dose to 15mg  to see if this helped with control. She reports that the ER version does not work as well as the IR version did and she did not feel the increased dose was helpful. She would like to go back to the IR version, if possible.   Hormone Deficiency She has been using topical testosterone  for significant deficiency noted with improvement of symptoms. She has no concerns at this time.  Obesity She has been working on [employer] with diet and exercise, as well as use of Wegovy . She is on 0.5mg  and has completed one week on this dose. She has not seen a robust weight loss as of yet, but is hopeful this will present when she increases the dose.    All ROS negative with exception of what is listed above.    PHYSICAL EXAM Physical Exam Vitals and nursing note reviewed.  Constitutional:      Appearance: Normal appearance.  HENT:     Head: Normocephalic.  Eyes:     Pupils: Pupils are equal, round, and reactive to light.  Cardiovascular:     Rate and Rhythm: Normal rate and regular rhythm.     Pulses: Normal pulses.     Heart sounds: Normal heart sounds.  Pulmonary:     Effort: Pulmonary effort is normal.     Breath sounds: Normal breath sounds.  Abdominal:     General: Bowel sounds are normal. There is no distension.     Palpations: Abdomen is soft.     Tenderness: There is no abdominal tenderness. There is no guarding.  Musculoskeletal:        General: Normal range of motion.     Cervical back: Normal range  of motion.     Right lower leg: No edema.     Left lower leg: No edema.  Skin:    General: Skin is warm.     Capillary Refill: Capillary refill takes less than 2 seconds.  Neurological:     General: No focal deficit present.     Mental Status: She is alert and oriented to person, place, and time.     Sensory: No sensory deficit.     Motor: No weakness.  Psychiatric:        Mood and Affect: Mood normal.        Behavior: Behavior normal.      PLAN Problem List Items Addressed This Visit     BMI 40.0-44.9, adult (HCC)   Continue with diet, exercise, and wegovy  for weight management. 1mg  dose sent to the pharmacy. OK to stay on 0.5mg  if she is having any symptoms of nausea when she reaches the end of the month.       Relevant Medications   semaglutide -weight management (WEGOVY ) 1 MG/0.5ML SOAJ SQ injection   amphetamine -dextroamphetamine  (ADDERALL) 20 MG tablet   amphetamine -dextroamphetamine  (ADDERALL) 20 MG tablet (Start on 04/24/2024)   amphetamine -dextroamphetamine  (ADDERALL) 20 MG tablet (Start on 05/22/2024)   B12 deficiency   Labs checked at last visit and stable. Injection provided today.  Testosterone  insufficiency   Topical compounded cream effective for symptom management. She has shown improvement in testosterone  levels to therapeutic level. Will plan to continue and monitor labs annually.       Attention deficit hyperactivity disorder (ADHD), combined type - Primary   Change Adderall back to IR version. Will send 20mg  dose for her to trial. She can consider skipping the afternoon dose if not needed or utilizing on 10mg  if the dosage is too robust. No concerns present at this time. PDMP reviewed.       Relevant Medications   amphetamine -dextroamphetamine  (ADDERALL) 20 MG tablet   amphetamine -dextroamphetamine  (ADDERALL) 20 MG tablet (Start on 04/24/2024)   amphetamine -dextroamphetamine  (ADDERALL) 20 MG tablet (Start on 05/22/2024)   Other Visit Diagnoses        Need for influenza vaccination       Relevant Orders   Flu vaccine trivalent PF, 6mos and older(Flulaval,Afluria,Fluarix,Fluzone) (Completed)     Nausea       Relevant Medications   ondansetron  (ZOFRAN -ODT) 4 MG disintegrating tablet         Return in about 6 months (around 09/25/2024).  Catheline Doing, DNP, AGNP-c

## 2024-03-27 NOTE — Assessment & Plan Note (Signed)
 Change Adderall back to IR version. Will send 20mg  dose for her to trial. She can consider skipping the afternoon dose if not needed or utilizing on 10mg  if the dosage is too robust. No concerns present at this time. PDMP reviewed.

## 2024-04-05 ENCOUNTER — Ambulatory Visit: Payer: Self-pay | Admitting: Genetic Counselor

## 2024-04-05 ENCOUNTER — Telehealth: Payer: Self-pay | Admitting: Genetic Counselor

## 2024-04-05 DIAGNOSIS — Z1379 Encounter for other screening for genetic and chromosomal anomalies: Secondary | ICD-10-CM | POA: Insufficient documentation

## 2024-04-05 NOTE — Progress Notes (Signed)
 HPI:  Gloria Lyons was previously seen in the [facility] due to a family history of cancer and concerns regarding a hereditary predisposition to cancer. Please refer to our prior cancer genetics clinic note for more information regarding our discussion, assessment and recommendations, at the time. Gloria Lyons recent genetic test results were disclosed to her, as were recommendations warranted by these results. These results and recommendations are discussed in more detail below.  Results were disclosed via telephone on 04/05/24.   FAMILY HISTORY:  We obtained a detailed, 4-generation family history.  Significant diagnoses are listed below: Family History  Problem Relation Age of Onset   Heart attack Mother    Seizures Mother    Hypertension Mother    Anxiety disorder Mother    Depression Mother    Leukemia Father 67 - 67       late 15s, ALL   Hypertension Father    Alcohol abuse Father    Cancer Father    COPD Father    Depression Father    Heart disease Father    Melanoma Maternal Aunt 50   Melanoma Maternal Uncle 60   Hearing loss Maternal Uncle    Hearing loss Maternal Uncle    Hypertension Paternal Aunt    Hypertension Paternal Aunt    Breast cancer Maternal Grandmother 50   Diabetes Maternal Grandmother    Diabetes Maternal Grandfather    Cancer Paternal Grandfather        type unknown   Diabetes Paternal Grandfather    Breast cancer Son 27   Hearing loss Maternal Cousin    Hearing loss Maternal Cousin     Gloria Lyons reports a maternal aunt that had genetic testing for breast cancer risk that was normal, report unable to be reviewed. Otherwise she is unaware of any genetic testing in her family. She reports ancestry as [place]. There is no reported Ashkenazi Jewish ancestry.    She reports her father was diagnosed with ALL in his late 10s and lung cancer in his early 76s, passed away at age 84. Her paternal grandfather was diagnosed with an  unknown cancer, passed away at age 1. She reports a maternal aunt and a maternal uncle diagnosed with several spots of melanoma each, first diagnosed in the 54s-60s. She reports her maternal grandmother was diagnosed with breast cancer at 94, had several recurrences and died of metastatic disease. Her maternal grandmother's sister was diagnosed with breast cancer at 6 and is living.    Of note, Gloria Lyons does report two of her female maternal first cousins diagnosed with hearing loss in infancy and two maternal uncles (mother's maternal half brothers) diagnosed with hearing loss in infancy. We discussed potential genetic etiologies for hearing loss and encouraged that her relatives that have been diagnosed with hearing loss consider genetic testing. We encouraged her to notify us  if we can help identify a genetics provider for these family members. She has concerns for her son's hearing, but shared that he had previously been evaluated and hearing at the time was within normal limits. Encourage her to continue raising concerns to her son's pediatrician.      GENETIC TEST RESULTS: Genetic testing reported out on 04/03/24 through the [facility] (MutliCancer + Hereditary MDS/Leukemia) panel found no pathogenic mutations. [facility]'s MultiCancer panel includes analysis of the following 70 genes: AIP, ALK, APC, ATM, AXIN2, BAP1, BARD1, BLM, BMPR1A, BRCA1, BRCA2, BRIP1, CDC73, CDH1, CDK4, CDKN1B, CDKN2A, CHEK2, CTNNA1, DICER1, EGFR, EPCAM, FH,  FLCN, GREM1, HOXB13, KIT, LZTR1, MAX, MBD4, MEN1, MET, MITF, MLH1, MSH2, MSH6, MUTYH, NF1, NF2, NTHL1, PALB2, PDGFRA, PMS2, POLD1, POLE, POT1, PRKAR1A, PTCH1, PTEN, RAD51C, RAD51D, RB1, RET, SDHA, SDHAF2, SDHB, SDHC, SDHD, SMAD4, SMARCA4, SMARCB1, SMARCE1, STK11, SUFU, TMEM127, TP53, TSC1, TSC2, VHL. [facility]'s Hereditary Myelodysplasic Syndrome/Leukemia panel + prelimiary evidence genes includes analysis of the following 39 genes: ANKRD26, ATM, BLM, CBL, CEBPA, DDX41,  ELANE, EPCAM, ERCC6L2, ETV6, G6PC3, GATA2, GFI1, HAX1, IKZF1, KRAS, MECOM, MLH1, MSH2, MSH6, NBN, NF1, PMS2, PTPN11, RTEL1, RUNX1, SAMD9, SAMD9L, SRP72, TERC, TERT, TP53, BRCA1, BRCA2, CHEK2, KDM1A, MBD4, PAX5, TET2. The test report has been scanned into EPIC and is located under the Molecular Pathology section of the Results Review tab.  A portion of the result report is included below for reference.     We discussed with Gloria Lyons that because current genetic testing is not perfect, it is possible there may be a gene mutation in one of these genes that current testing cannot detect, but that chance is small.  We also discussed, that there could be another gene that has not yet been discovered, or that we have not yet tested, that is responsible for the cancer diagnoses in the family. It is also possible there is a hereditary cause for the cancer in the family that Ms. Jarecki did not inherit and therefore was not identified in her testing.  Therefore, it is important to remain in touch with cancer genetics in the future so that we can continue to offer Ms. Cuneo the most up to date genetic testing.   ADDITIONAL GENETIC TESTING: We discussed with Gloria Lyons that her genetic testing was fairly extensive.  If there are genes identified to increase cancer risk that can be analyzed in the future, we would be happy to discuss and coordinate this testing at that time.    CANCER SCREENING RECOMMENDATIONS: [name] test result is considered negative (normal).  This means that we have not identified a hereditary cause for her family history of cancer at this time. Most cancers happen by chance and this negative test suggests that her family history of cancer may fall into this category.    Possible reasons for Gloria Lyons's negative genetic test include:  1. There may be a gene mutation in one of these genes that current testing methods cannot detect but that chance is small.  2. There could be another gene  that has not yet been discovered, or that we have not yet tested, that is responsible for the cancer diagnoses in the family.  3.  There may be no hereditary risk for cancer in the family. The cancers in Ms. Sorter and/or her family may be sporadic/familial or due to other genetic and environmental factors. 4. It is also possible there is a hereditary cause for the cancer in the family that Ms. Kurt did not inherit.  Therefore, it is recommended she continue to follow the cancer management and screening guidelines provided by her primary healthcare provider. An individual's cancer risk and medical management are not determined by genetic test results alone. Overall cancer risk assessment incorporates additional factors, including personal medical history, family history, and any available genetic information that may result in a personalized plan for cancer prevention and surveillance  Given Ms. Garcilazo's family history, we must interpret these negative results with some caution.  Families with features suggestive of hereditary risk for cancer tend to have multiple family members with cancer, diagnoses in multiple generations and diagnoses before the  age of 80. Ms. Alcaide family exhibits some of these features. Thus, this result may simply reflect our current inability to detect all mutations within these genes or there may be a different gene that has not yet been discovered or tested.   RECOMMENDATIONS FOR FAMILY MEMBERS:  Individuals in this family might be at some increased risk of developing cancer, over the general population risk, simply due to the family history of cancer.  We recommended women in this family have a yearly mammogram beginning at age 16, or 20 years younger than the earliest onset of cancer, an annual clinical breast exam, and perform monthly breast self-exams. Women in this family should also have a gynecological exam as recommended by their primary provider. All family members  should be referred for colonoscopy starting at age 76, or 62 years younger than the earliest onset of cancer.   It is also possible there is a hereditary cause for the cancer in Ms. Mathia's family that she did not inherit and therefore was not identified in her.  Genetic testing for hereditary cancer may be of benefit to other family members, especially if diagnosed with cancer.   FOLLOW-UP: Lastly, we discussed with Ms. Vanduyne that cancer genetics is a rapidly advancing field and it is possible that new genetic tests will be appropriate for her and/or her family members in the future. We encouraged her to remain in contact with cancer genetics on an annual basis so we can update her personal and family histories and let her know of advances in cancer genetics that may benefit this family.   Our contact number was provided. Ms. Ask questions were answered to her satisfaction, and she knows she is welcome to call us  at anytime with additional questions or concerns.   Burnard Ogren, MS, [facility] Licensed, Retail [email or web address] [phone]

## 2024-04-05 NOTE — Telephone Encounter (Signed)
 I spoke to Gloria Lyons to review results of genetic testing, completed with [facility]'s MultiCancer and Hereditary MDS/Leukemia panels. Testing did not identify any variants known to increase the risk for cancer.  Discussed that we do not know why there is cancer in the family. It is possible that the cancers in history occurred by chance or due to environmental factors. It is possible there are family members that have a variant that she did not inherit. It is also possible there is a hereditary cause for cancer that cannot be identified with current technology or was not testing. It will be important for her to keep in contact with genetics to keep up with whether additional testing may be needed.  Please see counseling note for further detail on this result.

## 2024-04-24 ENCOUNTER — Other Ambulatory Visit: Payer: Self-pay

## 2024-04-24 DIAGNOSIS — E538 Deficiency of other specified B group vitamins: Secondary | ICD-10-CM | POA: Diagnosis not present

## 2024-04-24 MED ORDER — CYANOCOBALAMIN 1000 MCG/ML IJ SOLN
1000.0000 ug | Freq: Once | INTRAMUSCULAR | Status: AC
  Administered 2024-04-24: 1000 ug via INTRAMUSCULAR

## 2024-05-10 ENCOUNTER — Encounter: Payer: Self-pay | Admitting: Nurse Practitioner

## 2024-05-13 ENCOUNTER — Other Ambulatory Visit: Payer: Self-pay

## 2024-05-13 MED ORDER — WEGOVY 1.7 MG/0.75ML ~~LOC~~ SOAJ: 1.7000 mg | SUBCUTANEOUS | 1 refills | Status: DC

## 2024-05-22 ENCOUNTER — Other Ambulatory Visit

## 2024-05-22 DIAGNOSIS — E538 Deficiency of other specified B group vitamins: Secondary | ICD-10-CM

## 2024-05-22 MED ORDER — CYANOCOBALAMIN 1000 MCG/ML IJ SOLN
1000.0000 ug | Freq: Once | INTRAMUSCULAR | Status: AC
  Administered 2024-05-22: 1000 ug via INTRAMUSCULAR

## 2024-05-22 NOTE — Progress Notes (Unsigned)
 Patient is in office today for a nurse visit for B12 Injection. Patient Injection was given in the  Right vastus lateralis. Patient tolerated injection well.

## 2024-06-19 ENCOUNTER — Other Ambulatory Visit

## 2024-06-19 DIAGNOSIS — E538 Deficiency of other specified B group vitamins: Secondary | ICD-10-CM | POA: Diagnosis not present

## 2024-06-19 MED ORDER — CYANOCOBALAMIN 1000 MCG/ML IJ SOLN
1000.0000 ug | Freq: Once | INTRAMUSCULAR | Status: AC
  Administered 2024-06-19: 1000 ug via INTRAMUSCULAR

## 2024-07-12 ENCOUNTER — Other Ambulatory Visit: Payer: Self-pay | Admitting: Nurse Practitioner

## 2024-07-17 ENCOUNTER — Other Ambulatory Visit

## 2024-11-22 ENCOUNTER — Encounter: Payer: Self-pay | Admitting: Nurse Practitioner
